# Patient Record
Sex: Female | Born: 1950
Health system: Southern US, Community
[De-identification: ages and names within clinical notes are randomized; demographics above are authoritative.]

## PROBLEM LIST (undated history)

## (undated) DIAGNOSIS — R112 Nausea with vomiting, unspecified: Secondary | ICD-10-CM

## (undated) DIAGNOSIS — Z9889 Other specified postprocedural states: Secondary | ICD-10-CM

## (undated) DIAGNOSIS — R87629 Unspecified abnormal cytological findings in specimens from vagina: Secondary | ICD-10-CM

## (undated) DIAGNOSIS — G4733 Obstructive sleep apnea (adult) (pediatric): Secondary | ICD-10-CM

## (undated) DIAGNOSIS — M797 Fibromyalgia: Secondary | ICD-10-CM

## (undated) DIAGNOSIS — H269 Unspecified cataract: Secondary | ICD-10-CM

## (undated) DIAGNOSIS — J309 Allergic rhinitis, unspecified: Secondary | ICD-10-CM

## (undated) DIAGNOSIS — M47816 Spondylosis without myelopathy or radiculopathy, lumbar region: Secondary | ICD-10-CM

## (undated) DIAGNOSIS — T7840XA Allergy, unspecified, initial encounter: Secondary | ICD-10-CM

## (undated) DIAGNOSIS — E785 Hyperlipidemia, unspecified: Secondary | ICD-10-CM

## (undated) DIAGNOSIS — F419 Anxiety disorder, unspecified: Secondary | ICD-10-CM

## (undated) DIAGNOSIS — K219 Gastro-esophageal reflux disease without esophagitis: Secondary | ICD-10-CM

## (undated) DIAGNOSIS — M75121 Complete rotator cuff tear or rupture of right shoulder, not specified as traumatic: Secondary | ICD-10-CM

## (undated) DIAGNOSIS — N950 Postmenopausal bleeding: Secondary | ICD-10-CM

## (undated) DIAGNOSIS — I10 Essential (primary) hypertension: Secondary | ICD-10-CM

## (undated) HISTORY — PX: REDUCTION MAMMAPLASTY: SUR839

## (undated) HISTORY — PX: TOTAL KNEE ARTHROPLASTY: SHX125

## (undated) HISTORY — PX: TONSILLECTOMY: SUR1361

## (undated) HISTORY — DX: Essential (primary) hypertension: I10

## (undated) HISTORY — PX: ADENOIDECTOMY: SUR15

## (undated) HISTORY — DX: Fibromyalgia: M79.7

## (undated) HISTORY — DX: Allergy, unspecified, initial encounter: T78.40XA

## (undated) HISTORY — DX: Spondylosis without myelopathy or radiculopathy, lumbar region: M47.816

## (undated) HISTORY — PX: EYE SURGERY: SHX253

## (undated) HISTORY — DX: Unspecified abnormal cytological findings in specimens from vagina: R87.629

## (undated) HISTORY — DX: Postmenopausal bleeding: N95.0

## (undated) HISTORY — PX: JOINT REPLACEMENT: SHX530

## (undated) HISTORY — DX: Allergic rhinitis, unspecified: J30.9

## (undated) HISTORY — DX: Hyperlipidemia, unspecified: E78.5

## (undated) HISTORY — DX: Unspecified cataract: H26.9

---

## 1978-12-31 HISTORY — PX: BREAST BIOPSY: SHX20

## 1998-12-31 DIAGNOSIS — G4700 Insomnia, unspecified: Secondary | ICD-10-CM

## 1998-12-31 HISTORY — DX: Insomnia, unspecified: G47.00

## 1998-12-31 HISTORY — PX: TOTAL KNEE ARTHROPLASTY: SHX125

## 1999-08-17 ENCOUNTER — Encounter: Payer: Self-pay | Admitting: Specialist

## 1999-08-21 ENCOUNTER — Inpatient Hospital Stay (HOSPITAL_COMMUNITY): Admission: RE | Admit: 1999-08-21 | Discharge: 1999-08-25 | Payer: Self-pay | Admitting: Specialist

## 1999-08-21 ENCOUNTER — Encounter: Payer: Self-pay | Admitting: Specialist

## 1999-12-18 ENCOUNTER — Encounter: Payer: Self-pay | Admitting: Specialist

## 1999-12-18 ENCOUNTER — Ambulatory Visit (HOSPITAL_COMMUNITY): Admission: RE | Admit: 1999-12-18 | Discharge: 1999-12-18 | Payer: Self-pay | Admitting: Specialist

## 2000-01-03 ENCOUNTER — Encounter: Payer: Self-pay | Admitting: Specialist

## 2000-01-03 ENCOUNTER — Ambulatory Visit (HOSPITAL_COMMUNITY): Admission: RE | Admit: 2000-01-03 | Discharge: 2000-01-04 | Payer: Self-pay | Admitting: Specialist

## 2000-01-17 ENCOUNTER — Ambulatory Visit (HOSPITAL_COMMUNITY): Admission: RE | Admit: 2000-01-17 | Discharge: 2000-01-17 | Payer: Self-pay | Admitting: Specialist

## 2000-01-17 ENCOUNTER — Encounter: Payer: Self-pay | Admitting: Specialist

## 2000-07-17 ENCOUNTER — Encounter: Admission: RE | Admit: 2000-07-17 | Discharge: 2000-07-17 | Payer: Self-pay | Admitting: Specialist

## 2000-07-17 ENCOUNTER — Encounter: Payer: Self-pay | Admitting: Specialist

## 2004-07-13 ENCOUNTER — Encounter: Admission: RE | Admit: 2004-07-13 | Discharge: 2004-07-13 | Payer: Self-pay | Admitting: Internal Medicine

## 2005-01-26 ENCOUNTER — Encounter: Admission: RE | Admit: 2005-01-26 | Discharge: 2005-01-26 | Payer: Self-pay | Admitting: Internal Medicine

## 2008-01-26 ENCOUNTER — Encounter: Admission: RE | Admit: 2008-01-26 | Discharge: 2008-01-26 | Payer: Self-pay | Admitting: Internal Medicine

## 2008-11-28 ENCOUNTER — Encounter: Payer: Self-pay | Admitting: Family Medicine

## 2008-12-26 ENCOUNTER — Encounter: Admission: RE | Admit: 2008-12-26 | Discharge: 2008-12-26 | Payer: Self-pay | Admitting: Internal Medicine

## 2009-04-05 ENCOUNTER — Encounter: Payer: Self-pay | Admitting: Family Medicine

## 2009-04-21 ENCOUNTER — Encounter: Admission: RE | Admit: 2009-04-21 | Discharge: 2009-04-21 | Payer: Self-pay | Admitting: Internal Medicine

## 2009-07-05 ENCOUNTER — Encounter: Payer: Self-pay | Admitting: Family Medicine

## 2010-01-23 ENCOUNTER — Encounter: Payer: Self-pay | Admitting: Family Medicine

## 2010-02-10 ENCOUNTER — Ambulatory Visit: Payer: Self-pay | Admitting: Family Medicine

## 2010-02-10 DIAGNOSIS — N393 Stress incontinence (female) (male): Secondary | ICD-10-CM | POA: Insufficient documentation

## 2010-02-10 DIAGNOSIS — N950 Postmenopausal bleeding: Secondary | ICD-10-CM | POA: Insufficient documentation

## 2010-02-10 DIAGNOSIS — I10 Essential (primary) hypertension: Secondary | ICD-10-CM | POA: Insufficient documentation

## 2010-02-10 DIAGNOSIS — E559 Vitamin D deficiency, unspecified: Secondary | ICD-10-CM | POA: Insufficient documentation

## 2010-02-10 DIAGNOSIS — Z96652 Presence of left artificial knee joint: Secondary | ICD-10-CM | POA: Insufficient documentation

## 2010-02-10 DIAGNOSIS — G4700 Insomnia, unspecified: Secondary | ICD-10-CM | POA: Insufficient documentation

## 2010-02-10 DIAGNOSIS — K219 Gastro-esophageal reflux disease without esophagitis: Secondary | ICD-10-CM | POA: Insufficient documentation

## 2010-02-13 ENCOUNTER — Encounter: Payer: Self-pay | Admitting: Family Medicine

## 2010-02-13 DIAGNOSIS — D72829 Elevated white blood cell count, unspecified: Secondary | ICD-10-CM | POA: Insufficient documentation

## 2010-02-15 LAB — CONVERTED CEMR LAB
Hemoglobin: 13.6 g/dL (ref 12.0–15.0)
RBC: 4.37 M/uL (ref 3.87–5.11)
RDW: 12.9 % (ref 11.5–15.5)
Saturation Ratios: 15 % — ABNORMAL LOW (ref 20–55)
TIBC: 365 ug/dL (ref 250–470)
TSH: 1.134 microintl units/mL (ref 0.350–4.500)
UIBC: 309 ug/dL
Vit D, 25-Hydroxy: 30 ng/mL (ref 30–89)

## 2010-02-17 ENCOUNTER — Encounter: Payer: Self-pay | Admitting: Family Medicine

## 2010-02-20 LAB — CONVERTED CEMR LAB
Basophils Absolute: 0.1 10*3/uL (ref 0.0–0.1)
Eosinophils Relative: 1 % (ref 0–5)
HCT: 40.6 % (ref 36.0–46.0)
Lymphocytes Relative: 44 % (ref 12–46)
Lymphs Abs: 4.9 10*3/uL — ABNORMAL HIGH (ref 0.7–4.0)
Neutro Abs: 5.2 10*3/uL (ref 1.7–7.7)
Platelets: 374 10*3/uL (ref 150–400)
RDW: 13.2 % (ref 11.5–15.5)
WBC: 11.2 10*3/uL — ABNORMAL HIGH (ref 4.0–10.5)

## 2010-03-30 ENCOUNTER — Ambulatory Visit: Payer: Self-pay | Admitting: Family Medicine

## 2010-03-30 DIAGNOSIS — A09 Infectious gastroenteritis and colitis, unspecified: Secondary | ICD-10-CM | POA: Insufficient documentation

## 2010-06-02 ENCOUNTER — Ambulatory Visit: Payer: Self-pay | Admitting: Family Medicine

## 2010-06-02 DIAGNOSIS — J019 Acute sinusitis, unspecified: Secondary | ICD-10-CM | POA: Insufficient documentation

## 2010-06-20 ENCOUNTER — Ambulatory Visit: Payer: Self-pay | Admitting: Family

## 2010-06-20 ENCOUNTER — Encounter: Admission: RE | Admit: 2010-06-20 | Discharge: 2010-06-20 | Payer: Self-pay | Admitting: Family Medicine

## 2010-06-20 DIAGNOSIS — F411 Generalized anxiety disorder: Secondary | ICD-10-CM | POA: Insufficient documentation

## 2010-06-20 DIAGNOSIS — M549 Dorsalgia, unspecified: Secondary | ICD-10-CM | POA: Insufficient documentation

## 2010-06-20 LAB — CONVERTED CEMR LAB
Glucose, Urine, Semiquant: NEGATIVE
Nitrite: NEGATIVE
Specific Gravity, Urine: 1.025
Urobilinogen, UA: 1
WBC Urine, dipstick: NEGATIVE

## 2010-06-21 ENCOUNTER — Encounter: Payer: Self-pay | Admitting: Family Medicine

## 2010-06-21 ENCOUNTER — Telehealth: Payer: Self-pay | Admitting: Family

## 2010-06-21 ENCOUNTER — Encounter: Payer: Self-pay | Admitting: Family

## 2010-06-21 LAB — CONVERTED CEMR LAB
Calcium: 9.8 mg/dL (ref 8.4–10.5)
Glucose, Bld: 77 mg/dL (ref 70–99)
Potassium: 4.2 meq/L (ref 3.5–5.3)
Sodium: 140 meq/L (ref 135–145)
Vit D, 25-Hydroxy: 38 ng/mL (ref 30–89)

## 2010-07-11 ENCOUNTER — Ambulatory Visit: Payer: Self-pay | Admitting: Family

## 2010-09-11 ENCOUNTER — Telehealth (INDEPENDENT_AMBULATORY_CARE_PROVIDER_SITE_OTHER): Payer: Self-pay | Admitting: *Deleted

## 2010-09-29 ENCOUNTER — Telehealth (INDEPENDENT_AMBULATORY_CARE_PROVIDER_SITE_OTHER): Payer: Self-pay | Admitting: *Deleted

## 2010-10-11 ENCOUNTER — Ambulatory Visit: Payer: Self-pay | Admitting: Family Medicine

## 2010-10-11 DIAGNOSIS — IMO0001 Reserved for inherently not codable concepts without codable children: Secondary | ICD-10-CM | POA: Insufficient documentation

## 2010-11-08 ENCOUNTER — Ambulatory Visit: Payer: Self-pay | Admitting: Family Medicine

## 2010-11-08 DIAGNOSIS — R1084 Generalized abdominal pain: Secondary | ICD-10-CM | POA: Insufficient documentation

## 2010-11-09 ENCOUNTER — Encounter: Payer: Self-pay | Admitting: Family Medicine

## 2010-11-10 LAB — CONVERTED CEMR LAB
ALT: 15 units/L (ref 0–35)
Basophils Absolute: 0 10*3/uL (ref 0.0–0.1)
CO2: 25 meq/L (ref 19–32)
Calcium: 9.6 mg/dL (ref 8.4–10.5)
Chloride: 104 meq/L (ref 96–112)
Creatinine, Ser: 0.87 mg/dL (ref 0.40–1.20)
Eosinophils Relative: 1 % (ref 0–5)
Glucose, Bld: 87 mg/dL (ref 70–99)
HCT: 41.1 % (ref 36.0–46.0)
Hemoglobin: 13.7 g/dL (ref 12.0–15.0)
Lipase: 15 units/L (ref 0–75)
Lymphocytes Relative: 39 % (ref 12–46)
Lymphs Abs: 3.8 10*3/uL (ref 0.7–4.0)
Monocytes Absolute: 0.8 10*3/uL (ref 0.1–1.0)
Neutro Abs: 5 10*3/uL (ref 1.7–7.7)
RDW: 13.4 % (ref 11.5–15.5)
Sodium: 140 meq/L (ref 135–145)
Total Protein: 6.9 g/dL (ref 6.0–8.3)
WBC: 9.7 10*3/uL (ref 4.0–10.5)

## 2010-12-13 ENCOUNTER — Encounter: Payer: Self-pay | Admitting: Family Medicine

## 2010-12-20 ENCOUNTER — Ambulatory Visit: Payer: Self-pay | Admitting: Family Medicine

## 2010-12-20 ENCOUNTER — Encounter: Payer: Self-pay | Admitting: Family Medicine

## 2010-12-20 LAB — CONVERTED CEMR LAB: Glucose, Bld: 92 mg/dL (ref 70–99)

## 2010-12-21 LAB — CONVERTED CEMR LAB
LDL Cholesterol: 122 mg/dL — ABNORMAL HIGH (ref 0–99)
Vit D, 25-Hydroxy: 45 ng/mL (ref 30–89)

## 2011-01-25 ENCOUNTER — Encounter: Payer: Self-pay | Admitting: Family Medicine

## 2011-01-25 ENCOUNTER — Telehealth: Payer: Self-pay | Admitting: Family Medicine

## 2011-01-30 NOTE — Assessment & Plan Note (Signed)
Summary: ? UTI   Vital Signs:  Patient profile:   60 year old female Height:      64.25 inches Weight:      218 pounds BMI:     37.26 O2 Sat:      97 % on Room air Temp:     98.5 degrees F oral Pulse rate:   86 / minute BP sitting:   117 / 70  (left arm) Cuff size:   large  Vitals Entered By: Payton Spark CMA (November 08, 2010 2:30 PM)  O2 Flow:  Room air CC: ? UTI and back pain   Primary Care Provider:  Seymour Bars DO  CC:  ? UTI and back pain.  History of Present Illness: 60 yo F prsents for pelvic pain and L flank pain that started yesterday.  She had chills and had some spotting in her panties.  She had this a few mos ago and saw her gyn and was told it was breakthrough from her HRT.  She had a UTI this summer. She has strong smelling urine but no dysuria.  No hematuria.  Having some urgency but no frequency. Had a loose BM yesterday but no N/V/ constipation or rectal bleeding.   Pain over the L lower back and is moderate.      Current Medications (verified): 1)  Zolpidem Tartrate 10 Mg Tabs (Zolpidem Tartrate) .... Take By Mouth Once Daily 2)  Omeprazole 40 Mg Cpdr (Omeprazole) .Marland Kitchen.. 1 Tab By Mouth Daily 3)  Lisinopril-Hydrochlorothiazide 20-25 Mg Tabs (Lisinopril-Hydrochlorothiazide) .... Take By Mouth Once Daily 4)  Combipatch 0.05-0.14 Mg/day Pttw (Estradiol-Norethindrone Acet) 5)  Align  Caps (Probiotic Product) 6)  Piroxicam 20 Mg Caps (Piroxicam) .Marland Kitchen.. 1 Capsule By Mouth Qam With Food For Muscle/ Joint  Pain 7)  Celexa 20 Mg Tabs (Citalopram Hydrobromide) .... 1/2 Tab By Mouth Daily X 7 Days Then Stop 8)  Nasonex 50 Mcg/act Susp (Mometasone Furoate) .... Use 2 Sprays Per Nostril Daily. 9)  Cymbalta 60 Mg Cpep (Duloxetine Hcl) .Marland Kitchen.. 1 Capsule By Mouth Daily 10)  Lyrica 50 Mg Caps (Pregabalin) .Marland Kitchen.. 1 Capsule By Mouth Two Times A Day  Allergies (verified): No Known Drug Allergies  Past History:  Past Medical History: Reviewed history from 10/11/2010 and no  changes required. HTN postmenopausal bleeding Fibromyalgia, [name] gyn Dr Christella Hartigan  Social History: Reviewed history from 02/10/2010 and no changes required. 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  Has a grown child - daughter in [place] Never smoked. occas ETOH. Walks for exercise.  Review of Systems      See HPI  Physical Exam  General:  alert, well-developed, well-nourished, and well-hydrated.  obese Eyes:  sclera non icteric Mouth:  good dentition and pharynx pink and moist.   Lungs:  Normal respiratory effort, chest expands symmetrically. Lungs are clear to auscultation, no crackles or wheezes. Heart:  Normal rate and regular rhythm. S1 and S2 normal without gallop, murmur, click, rub or other extra sounds. Abdomen:  slight periumbilical and suprapubic TTP w/o guarding.  No CVAT.  soft.  ND.  NABS Skin:  color normal.  no pallor, rash or jaundice   Impression & Recommendations:  Problem # 1:  ABDOMINAL PAIN, GENERALIZED (ICD-789.07) UA normal.  Postmenopausal bleeding is abnormal.  Will get labs to r/o other sources for abd pain but will need f/u with her gyn for endometrial biopsy.   Orders: T-CBC w/Diff ([id number]) T-Comprehensive Metabolic Panel [id number]) T-Lipase [id number])  Complete Medication  List: 1)  Zolpidem Tartrate 10 Mg Tabs (Zolpidem tartrate) .... Take by mouth once daily 2)  Omeprazole 40 Mg Cpdr (Omeprazole) .Marland Kitchen.. 1 tab by mouth daily 3)  Lisinopril-hydrochlorothiazide 20-25 Mg Tabs (Lisinopril-hydrochlorothiazide) .... Take by mouth once daily 4)  Combipatch 0.05-0.14 Mg/day Pttw (Estradiol-norethindrone acet) 5)  Align Caps (Probiotic product) 6)  Piroxicam 20 Mg Caps (Piroxicam) .Marland Kitchen.. 1 capsule by mouth qam with food for muscle/ joint  pain 7)  Celexa 20 Mg Tabs (Citalopram hydrobromide) .... 1/2 tab by mouth daily x 7 days then stop 8)  Nasonex 50 Mcg/act Susp (Mometasone furoate) .... Use 2 sprays per nostril  daily. 9)  Cymbalta 60 Mg Cpep (Duloxetine hcl) .Marland Kitchen.. 1 capsule by mouth daily 10)  Lyrica 50 Mg Caps (Pregabalin) .Marland Kitchen.. 1 capsule by mouth two times a day  Other Orders: UA Dipstick w/o Micro (automated)  (81003)  Patient Instructions: 1)  UA is normal. 2)  Will do labs today. 3)  Will call you w/ results tomorrow. 4)  Call Dr Christella Hartigan to follow up with him for spotting.   Orders Added: 1)  UA Dipstick w/o Micro (automated)  [81003] 2)  T-CBC w/Diff [[id number]] 3)  T-Comprehensive Metabolic Panel [[id number]] 4)  T-Lipase [[id number]] 5)  Est. Patient Level III [09811]  Appended Document: ? UTI  Laboratory Results   Urine Tests    Routine Urinalysis   Color: yellow Appearance: Clear Glucose: negative   (Normal Range: Negative) Bilirubin: small   (Normal Range: Negative) Ketone: trace (5)   (Normal Range: Negative) Spec. Gravity: >=1.030   (Normal Range: 1.003-1.035) Blood: small   (Normal Range: Negative) pH: 6.0   (Normal Range: 5.0-8.0) Protein: 30   (Normal Range: Negative) Urobilinogen: 1.0   (Normal Range: 0-1) Nitrite: negative   (Normal Range: Negative) Leukocyte Esterace: negative   (Normal Range: Negative)

## 2011-01-30 NOTE — Progress Notes (Signed)
Summary: Ambien  Phone Note Call from Patient Call back at Work Phone [phone]   Caller: Patient Call For: Seymour Bars DO Summary of Call: Pt calls and states pharmacy did not get the Ambien rx and she is out. [facility] Initial call taken by: Kathlene November LPN,  September 29, 2010 10:53 AM  Follow-up for Phone Call        Rx faxed this AM. Follow-up by: Payton Spark CMA,  September 29, 2010 11:19 AM

## 2011-01-30 NOTE — Assessment & Plan Note (Signed)
Summary: NOV HTN   Vital Signs:  Patient profile:   60 year old female Height:      64.25 inches Weight:      233 pounds BMI:     39.83 O2 Sat:      97 % on Room air Temp:     98.1 degrees F oral Pulse rate:   100 / minute BP sitting:   125 / 84  (right arm) Cuff size:   large  Vitals Entered By: Payton Spark CMA/April (February 10, 2010 3:51 PM)  O2 Flow:  Room air CC: new patient to discuss meds   Primary Care Provider:  Seymour Bars DO  CC:  new patient to discuss meds.  History of Present Illness: 60 yo F presents for NOV.  She has hx of HTN.  She is taking Lisinopril/HCTZ daily.  Her labs were updated in August.  She is seeing Dr Ricka Burdock at [facility] for stress urinary incontinence and abnormal uterine bleeding.  She went for an endometrial biopsy b/c she has been postmenopausal x 11 yrs.  She also had a vag u/s that was + for fibroids.  Her pathology was neg for cancer.  She has f/u there.  She is due for med RFs.  She works as a Pharmacist, hospital.  She also has hx of low Vit D and GERD.    Her last colonoscopy was June 09. Mammogram is due in March.  Pap smear Updated Oct 2010.  Current Medications (verified): 1)  Zolpidem Tartrate 10 Mg Tabs (Zolpidem Tartrate) .... Take By Mouth Once Daily 2)  Omeprazole 20 Mg Cpdr (Omeprazole) .... Take 2 Once Daily 3)  Lisinopril-Hydrochlorothiazide 20-25 Mg Tabs (Lisinopril-Hydrochlorothiazide) .... 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 Married to [place].  Has a grown child - daughter in  [place] Never smoked. occas ETOH. Walks for exercise.  Review of Systems       no fevers/sweats/weakness, unexplained wt loss/gain, no change in vision, no difficulty hearing, ringing in ears, no hay fever/allergies, no CP/discomfort, no palpitations, no breast lump/nipple discharge, no cough/wheeze, no blood in stool,no  N/V/D, + nocturia, + leaking urine, + unusual vag bleeding, no vaginal/penile discharge, no muscle/joint pain, no rash, no new/changing mole, no HA, no memory loss, no anxiety, + sleep problem, no depression, no unexplained lumps, no easy bruising/bleeding, no concern with sexual function     Physical Exam  General:  alert, well-developed, well-nourished, and well-hydrated.  obese Head:  normocephalic and atraumatic.   Eyes:  bilat anterior chamber clouding wears glasses Ears:  no external deformities.   Nose:  no nasal discharge.   Mouth:  good dentition and pharynx pink and moist.   Neck:  no masses.   Lungs:  Normal respiratory effort, chest expands symmetrically. Lungs are clear to auscultation, no crackles or wheezes. Heart:  Normal rate and regular rhythm. S1 and S2 normal without gallop, murmur, click, rub or other extra sounds. Pulses:  2+ radial pulses Extremities:  no E/C/C Skin:  color normal.   Cervical Nodes:  No lymphadenopathy noted Psych:  good eye contact, not anxious appearing, and  not depressed appearing.     Impression & Recommendations:  Problem # 1:  ESSENTIAL HYPERTENSION, BENIGN (ICD-401.1) BP at goal.  RFds Med meds.  Had labs Aug 2010.  Will get records.  BMI 39 c/w class II obesity.  Discussed working on diet, exercise and wt loss. Her updated medication list for this problem includes:    Lisinopril-hydrochlorothiazide 20-25 Mg Tabs (Lisinopril-hydrochlorothiazide) .Marland Kitchen... Take by mouth once daily  BP today: 125/84  Problem # 2:  POSTMENOPAUSAL BLEEDING (ICD-627.1) Seeing [facility] for this.  Just had endometrial biopsy and u/s done  which was neg for cancer.  she is on HRT and she has f/u with him.  Will check labs to make sure she does not have secondary iron def anemia. Her updated medication list for this problem includes:    Combipatch 0.05-0.14 Mg/day Pttw (Estradiol-norethindrone acet)  Orders: T-CBC No Diff ([id number]) T-Iron ([id number]) T-Iron Binding Capacity (TIBC) ([id number]) T-TSH ([id number])  Problem # 3:  STRESS INCONTINENCE (ICD-788.39) Seeing gyn for a specific treamtment program  Problem # 4:  ESOPHAGEAL REFLUX (ICD-530.81) Changed Rx from Prilosec 20 2 tabs daily to Omeprazole 40 mg daily for cost savings. Her updated medication list for this problem includes:    Omeprazole 40 Mg Cpdr (Omeprazole) .Marland Kitchen... 1 tab by mouth daily  Complete Medication List: 1)  Zolpidem Tartrate 10 Mg Tabs (Zolpidem tartrate) .... Take by mouth once daily 2)  Omeprazole 40 Mg Cpdr (Omeprazole) .Marland Kitchen.. 1 tab by mouth daily 3)  Lisinopril-hydrochlorothiazide 20-25 Mg Tabs (Lisinopril-hydrochlorothiazide) .... Take by mouth once daily 4)  Combipatch 0.05-0.14 Mg/day Pttw (Estradiol-norethindrone acet)  Other Orders: T-Vitamin D (25-Hydroxy) ([id number])  Patient Instructions: 1)  Have labs drawn today. 2)  Will call you w/ results on Monday. 3)  Will get old records. 4)  Return for f/u BP in 4 mos. Prescriptions: OMEPRAZOLE 40 MG CPDR (OMEPRAZOLE) 1 tab by mouth daily  #30 x 3   Entered and Authorized by:   Seymour Bars DO   Signed by:   Seymour Bars DO on 02/10/2010   Method used:   Printed then faxed to ...       [facility] [address] [facility] [address] [facility]       [address]       [place], [place]  [address]       Ph: [phone]       Fax: [phone]   RxID:   [id number] ZOLPIDEM TARTRATE 10 MG TABS (ZOLPIDEM TARTRATE) take by mouth once daily  #30 x 2   Entered and Authorized by:   Seymour Bars DO   Signed by:   Seymour Bars DO on 02/10/2010   Method used:   Printed then faxed to ...       [facility] [address] (retail)       [address]       [place], [place]  [address]       Ph: [phone]       Fax: [phone]   RxID:   [id number]

## 2011-01-30 NOTE — Assessment & Plan Note (Signed)
Summary: Back pain - jr   Vital Signs:  Patient profile:   60 year old female Height:      64.25 inches Pulse rate:   94 / minute BP sitting:   123 / 77  (left arm) Cuff size:   large  Vitals Entered By: Kathlene November (June 20, 2010 1:02 PM) CC: back pain- sees ortho and told it was arthritis   Primary Care Provider:  Seymour Bars DO  CC:  back pain- sees ortho and told it was arthritis.  History of Present Illness: Ms Julson is a 60 year old female who presents today with complaint of low back pain.  She has seen Dr. Jillyn Hidden with [facility] in the past and reports that she underwent an MRI of the spine about 3 months ago and was told that it showed mild arthitic changes in her back.   She reports left low back pain which is unchanged.  She reports increased soreness in her neck and legs-feels nervous and jittery.  Her Mother is currently in a nursing home and recently underwent surgery.  She feels that this has been stressful for her.  She reports occasional nervousness which is associated with nausea and palitations.  This is improved with meditation/relaxation.  She has been waking up at night.  Denies associated symptoms of depression.  Patient has lost 40 pounds.  Patient notes + history of kidney stone in the past.    Current Medications (verified): 1)  Zolpidem Tartrate 10 Mg Tabs (Zolpidem Tartrate) .... Take By Mouth Once Daily 2)  Omeprazole 40 Mg Cpdr (Omeprazole) .Marland Kitchen.. 1 Tab By Mouth Daily 3)  Lisinopril-Hydrochlorothiazide 20-25 Mg Tabs (Lisinopril-Hydrochlorothiazide) .... Take By Mouth Once Daily 4)  Combipatch 0.05-0.14 Mg/day Pttw (Estradiol-Norethindrone Acet) 5)  Align  Caps (Probiotic Product) 6)  Meloxicam 7.5 Mg Tabs (Meloxicam) .... Take One Tablet By Mouth After A Meal  Allergies (verified): No Known Drug Allergies  Comments:  Nurse/Medical Assistant: The patient's medications and allergies were reviewed with the patient and were updated in the Medication and  Allergy Lists. 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 occas ETOH. Walks for exercise.  Review of Systems       +frequency/urgency of urination.    Physical Exam  General:  Well-developed,well-nourished,in no acute distress; alert,appropriate and cooperative throughout examination Head:  Normocephalic and atraumatic without obvious abnormalities. No apparent alopecia or balding. Lungs:  Normal respiratory effort, chest expands symmetrically. Lungs are clear to auscultation, no crackles or wheezes. Heart:  Normal rate and regular rhythm. S1 and S2 normal without gallop, murmur, click, rub or other extra sounds. Msk:  negative CVAT, no tenderness noted over spine.   Impression & Recommendations:  Problem # 1:  ANXIETY (ICD-300.00) Assessment New Will give trial of citalopram. Her updated medication list for this problem includes:    Celexa 20 Mg Tabs (Citalopram hydrobromide) .Marland Kitchen... 1/2 tab by mouth daily x 1 week, then increase to one tablet daily  Problem # 2:  BACK PAIN (ICD-724.5)  trace blood in urinalysis.  CT negative for stones.  Patient reports MRI with Orthtopedics significant only for arthritic changes.  Plan to continue meloxicam and will refer to physical therapy. Send UA for culture Her updated medication list for this problem includes:    Meloxicam 7.5 Mg Tabs (Meloxicam) .Marland Kitchen... Take one tablet by mouth after a meal  Orders: UA Dipstick w/o Micro (automated)  (81003) T-Basic  Metabolic Panel ([id number]) CT without Contrast (CT w/o contrast) Physical Therapy Referral (PT)  Problem # 3:  VITAMIN D DEFICIENCY (ICD-268.9) check vitamin D level Orders: T-Assay of Vitamin D ([id number])  Complete Medication List: 1)  Zolpidem Tartrate 10 Mg Tabs (Zolpidem tartrate) .... Take by mouth once daily 2)  Omeprazole 40 Mg Cpdr (Omeprazole) .Marland Kitchen.. 1 tab by mouth daily 3)  Lisinopril-hydrochlorothiazide 20-25 Mg Tabs (Lisinopril-hydrochlorothiazide) .... Take by mouth once daily 4)  Combipatch 0.05-0.14 Mg/day Pttw (Estradiol-norethindrone acet) 5)  Align Caps (Probiotic product) 6)  Meloxicam 7.5 Mg Tabs (Meloxicam) .... Take one tablet by mouth after a meal 7)  Ergocalciferol 50000 Unit Caps (Ergocalciferol) .... One tablet by mouth once weekly 8)  Celexa 20 Mg Tabs (Citalopram hydrobromide) .... 1/2 tab by mouth daily x 1 week, then increase to one tablet daily  Other Orders: T-Culture, Urine ([id number])  Patient Instructions: 1)  Please complete your CT downstairs. 2)  Citalopram- please start 1/2 tablet one a day for one week, then increase to a full tablet. 3)  It will likely take several weeks before you will notice improvement. 4)  Side effects of this medicine may include drowsiness or nausea.  If this becomes an issue for you call for further instructions. 5)  Very rarely people may develop suicidal thoughts when taking these types of medicines- should this happen to you, discontinue medication and go directly to the emergency room. 6)  Please arrange a follow up appointment in 1 month  Prescriptions: CELEXA 20 MG TABS (CITALOPRAM HYDROBROMIDE) 1/2 tab by mouth daily x 1 week, then increase to one tablet daily  #30 x 1   Entered and Authorized by:   Lemont Fillers FNP   Signed by:   Lemont Fillers FNP on 06/20/2010   Method used:   [facility]        [facility]       [address]       [place], [place]   [address]       Ph: [phone]       Fax: [phone]   RxID:   [id number]   Laboratory Results   Urine Tests  Date/Time Received: 06/20/2010 Date/Time Reported: 06/20/2010  Routine Urinalysis   Color: yellow Appearance: Clear Glucose: negative   (Normal Range: Negative) Bilirubin: negative   (Normal Range: Negative) Ketone: trace (5)   (Normal Range: Negative) Spec. Gravity: 1.025   (Normal Range: 1.003-1.035) Blood: small   (Normal Range: Negative) pH: 6.0   (Normal Range: 5.0-8.0) Protein: trace   (Normal Range: Negative) Urobilinogen: 1.0   (Normal Range: 0-1) Nitrite: negative   (Normal Range: Negative) Leukocyte Esterace: negative   (Normal Range: Negative)

## 2011-01-30 NOTE — Assessment & Plan Note (Signed)
Summary: follow up, not feeling well   Vital Signs:  Patient profile:   60 year old female Height:      64.25 inches Weight:      218 pounds Temp:     98.5 degrees F oral Pulse rate:   68 / minute BP sitting:   111 / 69  (left arm) Cuff size:   large  Vitals Entered By: Kathlene November (July 11, 2010 3:56 PM) CC: followup on Celexa- pt states feeling some better- not as nervous and uneasy as she was   Primary Care Provider:  Seymour Bars DO  CC:  followup on Celexa- pt states feeling some better- not as nervous and uneasy as she was.  History of Present Illness: Ms Doland is a 60 year old female who presents today for followup of her anxiety.  Reports feeling better since starting on Celexa.   She continues to have stress related to her aging mother- but feels like she is better able to put this in perspective.  Sleeping better, not having as much nervous jerking of her legs.  Denies panic attacks.  Tolerating med without significant side effects. Denies symptoms of depression or suicide ideation.  Current Medications (verified): 1)  Zolpidem Tartrate 10 Mg Tabs (Zolpidem Tartrate) .... Take By Mouth Once Daily 2)  Omeprazole 40 Mg Cpdr (Omeprazole) .Marland Kitchen.. 1 Tab By Mouth Daily 3)  Lisinopril-Hydrochlorothiazide 20-25 Mg Tabs (Lisinopril-Hydrochlorothiazide) .... Take By Mouth Once Daily 4)  Combipatch 0.05-0.14 Mg/day Pttw (Estradiol-Norethindrone Acet) 5)  Align  Caps (Probiotic Product) 6)  Meloxicam 7.5 Mg Tabs (Meloxicam) .... Take One Tablet By Mouth After A Meal 7)  Celexa 20 Mg Tabs (Citalopram Hydrobromide) .... Take One Tablet By Mouth Once A Day  Allergies (verified): No Known Drug Allergies  Comments:  Nurse/Medical Assistant: The patient's medications and allergies were reviewed with the patient and were updated in the Medication and Allergy Lists. Kathlene November (July 11, 2010 3:59 PM)  Physical Exam  General:  Well-developed,well-nourished,in no acute distress;  alert,appropriate and cooperative throughout examination Psych:  Cognition and judgment appear intact. Alert and cooperative with normal attention span and concentration. No apparent delusions, illusions, hallucinations   Impression & Recommendations:  Problem # 1:  ANXIETY (ICD-300.00) Assessment Improved Symptoms improved, plan to continue celexa.  15 minutes spent with patient. Greater than 50% of this time was spent counseling patient on her anxiety. Her updated medication list for this problem includes:    Celexa 20 Mg Tabs (Citalopram hydrobromide) .Marland Kitchen... Take one tablet by mouth once a day  Complete Medication List: 1)  Zolpidem Tartrate 10 Mg Tabs (Zolpidem tartrate) .... Take by mouth once daily 2)  Omeprazole 40 Mg Cpdr (Omeprazole) .Marland Kitchen.. 1 tab by mouth daily 3)  Lisinopril-hydrochlorothiazide 20-25 Mg Tabs (Lisinopril-hydrochlorothiazide) .... Take by mouth once daily 4)  Combipatch 0.05-0.14 Mg/day Pttw (Estradiol-norethindrone acet) 5)  Align Caps (Probiotic product) 6)  Meloxicam 7.5 Mg Tabs (Meloxicam) .... Take one tablet by mouth after a meal 7)  Celexa 20 Mg Tabs (Citalopram hydrobromide) .... Take one tablet by mouth once a day  Patient Instructions: 1)  Please follow up with Dr. Cathey Endow in 3 months, sooner if problems or concerns. 2)  Follow up with Physical Therapist.  Prescriptions: CELEXA 20 MG TABS (CITALOPRAM HYDROBROMIDE) Take one tablet by mouth once a day  #30 x 2   Entered and Authorized by:   Lemont Fillers FNP   Signed by:   Lemont Fillers FNP on 07/11/2010  Method used:   Electronically to        [facility] [address] [facility] [address]       [address]       [place], [place]  [address]       Ph: [phone]       Fax: [phone]   RxID:   [id number]

## 2011-01-30 NOTE — Medication Information (Signed)
Summary: Approval for Additional Quantity Zolpidem Tartrate/[facility]  Approval for Additional Quantity Zolpidem Tartrate/[facility]   Imported By: Lanelle Bal 04/06/2010 11:08:36  _____________________________________________________________________  External Attachment:    Type:   Image     Comment:   External Document

## 2011-01-30 NOTE — Assessment & Plan Note (Signed)
Summary: diarrhea   Vital Signs:  Patient profile:   60 year old female Height:      64.25 inches Weight:      222 pounds BMI:     37.95 O2 Sat:      97 % on Room air Temp:     98.3 degrees F oral Pulse rate:   87 / minute BP sitting:   123 / 82  (left arm) Cuff size:   large  Vitals Entered By: Payton Spark CMA (March 30, 2010 10:19 AM)  O2 Flow:  Room air CC: Diarrhea and achy x 3 days. Not sure if it is a virus or IBS.    Primary Care Provider:  Seymour Bars DO  CC:  Diarrhea and achy x 3 days. Not sure if it is a virus or IBS. Marland Kitchen  History of Present Illness: 60 yo F presents for loose stools x 3 days.  She has hx of IBS.  She ate a hot dog about 2 hrs prior to this occuring.  [facility] been seeing [facility] and has been taking a Probiotic.  She had a colonoscopy in Dec 08.  She has no known hx of diverticulosis.  She thinks that she had a CT abd/ pelvis with her gyn last year.   She has some lower abdominal cramping, mostly LLQ which has been on an off for months but she has not had this much diarrhea in the past.  She denies  seeing  blood in her stool   Pepto Bismol and bentyl have not helped.  Her stools are loose and watery and occuring about 20 x a day. .  She had been back on Weight Watchers and has lost about 11 lbs.  She denies fevers or chills.  She is trying to stay hydrated.  She has a little nausea but no vomitting.    She is starting to feel weak.  Denies sick contacts.  Denies recent travel.      Current Medications (verified): 1)  Zolpidem Tartrate 10 Mg Tabs (Zolpidem Tartrate) .... Take By Mouth Once Daily 2)  Omeprazole 40 Mg Cpdr (Omeprazole) .Marland Kitchen.. 1 Tab By Mouth Daily 3)  Lisinopril-Hydrochlorothiazide 20-25 Mg Tabs (Lisinopril-Hydrochlorothiazide) .... Take By Mouth Once Daily 4)  Combipatch 0.05-0.14 Mg/day Pttw (Estradiol-Norethindrone Acet) 5)  Align  Caps (Probiotic Product)  Allergies (verified): No Known Drug Allergies  Past  History:  Past Medical History: Reviewed history from 02/10/2010 and no changes required. HTN postmenopausal bleeding  gyn Dr Christella Hartigan  Past Surgical History: Reviewed history from 02/10/2010 and no changes required. R knee replacement 2000, Dr Shelle Iron breast biopsy 1980,neg  Family History: Reviewed history from 02/10/2010 and no changes required. Mother, alive, DM Father died, war wounds, HTN, DM 2 brothers healthy  Social History: Reviewed history from 02/10/2010 and no changes required. 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  Has a grown child - daughter in [place] Never smoked. occas ETOH. Walks for exercise.  Review of Systems      See HPI  Physical Exam  General:  alert, well-developed, well-nourished, and well-hydrated.  obese Head:  normocephalic and atraumatic.   Eyes:  sclera non icteric Mouth:  pharynx pink and moist.   Neck:  no masses.   Lungs:  Normal respiratory effort, chest expands symmetrically. Lungs are clear to auscultation, no crackles or wheezes. Heart:  Normal rate and regular rhythm. S1 and S2 normal without gallop, murmur, click, rub or other extra sounds.  Abdomen:  soft.  NT/ND.  NABS.  No HSM.   Extremities:  trace LE edema bilat Skin:  no pallor or jaundice. skin warm and dry Cervical Nodes:  No lymphadenopathy noted Psych:  good eye contact, not anxious appearing, and not depressed appearing.     Impression & Recommendations:  Problem # 1:  INFECTIOUS COLITIS ENTERITIS AND GASTROENTERITIS (ICD-009.0) 3 days of large quantity loose stool w/o clinical dehydration.  Unlikely IBS given large # of stools/ day. Will treat with Cipro 500 mg two times a day x 5 days and Immodium to help slow down diarrhea. Rest.  Hydrate with diluted gatorade.  If not improved by Monday, will get a stool sample, CBC and CMP. Will look at her CT abd/ pelvis done by gyn last year to look for cause of her LLQ pain.  Complete Medication  List: 1)  Zolpidem Tartrate 10 Mg Tabs (Zolpidem tartrate) .... Take by mouth once daily 2)  Omeprazole 40 Mg Cpdr (Omeprazole) .Marland Kitchen.. 1 tab by mouth daily 3)  Lisinopril-hydrochlorothiazide 20-25 Mg Tabs (Lisinopril-hydrochlorothiazide) .... Take by mouth once daily 4)  Combipatch 0.05-0.14 Mg/day Pttw (Estradiol-norethindrone acet) 5)  Align Caps (Probiotic product) 6)  Cipro 500 Mg Tabs (Ciprofloxacin hcl) .Marland Kitchen.. 1 tab by mouth two times a day x 5 days  Patient Instructions: 1)  Rest and hydrate with diluted gatorade throughout the day. 2)  Advance diet to bananas, rice, applesause and toast as abdominal pain and diarrhea improve. 3)  Take Cipro 500 mg 2 x a day for infectious coliits. 4)  Use Immodium 2 tabs by mouth 3 x a day as needed for diarrhea. 5)  Call if diarrhea has not improved by Monday.   Prescriptions: CIPRO 500 MG TABS (CIPROFLOXACIN HCL) 1 tab by mouth two times a day x 5 days  #10 x 0   Entered and Authorized by:   Seymour Bars DO   Signed by:   Seymour Bars DO on 03/30/2010   Method used:   Electronically to        [facility] [address] [facility]       [address]       [place], [place]  [address]       Ph: [phone]       Fax: [phone]   RxID:   [id number]   Appended Document: diarrhea Pls let pt know that I reviewed her CT abd/ pelvis done in Jan by Dr Christella Hartigan and it was normal other than incidental finding of cysts on her liver.  Seymour Bars, D.O.  Appended Document: diarrhea LMOM informing Pt of the above

## 2011-01-30 NOTE — Progress Notes (Signed)
       New/Updated Medications: NASONEX 50 MCG/ACT SUSP (MOMETASONE FUROATE) Use 2 sprays per nostril daily. Prescriptions: NASONEX 50 MCG/ACT SUSP (MOMETASONE FUROATE) Use 2 sprays per nostril daily.  #1 bottle x 1   Entered by:   Payton Spark CMA   Authorized by:   Seymour Bars DO   Signed by:   Payton Spark CMA on 09/11/2010   Method used:   [facility] [address] [facility]       [address]       [place], [place]  [address]       Ph: [phone]       Fax: [phone]   RxID:   [id number]

## 2011-01-30 NOTE — Letter (Signed)
Summary: [facility]  [facility]   Imported By: Lanelle Bal 04/06/2010 11:09:39  _____________________________________________________________________  External Attachment:    Type:   Image     Comment:   External Document

## 2011-01-30 NOTE — Letter (Signed)
   [place] [facility] [address] [place], [place]  [address]  [place] Phone: [phone]      June 21, 2010   [facility] [place] [address] [place], [place] [address]  RE:  LAB RESULTS  Dear  Ms. Mchale,  The following is an interpretation of your most recent lab tests.  Please take note of any instructions provided or changes to medications that have resulted from your lab work.  ELECTROLYTES:  Good - no changes needed  KIDNEY FUNCTION TESTS:  Good - no changes needed  Your Vitamin D level is normal.  Please stop the weekly vitamin D supplement and start a daily OTC supplement as below. Please follow up in 3 months for a vitamin D level.   Medications Prescribed or Changed VITAMIN D3 1000 UNIT TABS (CHOLECALCIFEROL) 3 tabs by mouth daily   Sincerely Yours,    Lemont Fillers FNP

## 2011-01-30 NOTE — Miscellaneous (Signed)
Summary: CBC with diff  Clinical Lists Changes  Problems: Added new problem of LEUKOCYTOSIS (ICD-288.60) Orders: Added new Test order of T-CBC w/Diff [id number]) - Signed

## 2011-01-30 NOTE — Letter (Signed)
Summary: [facility]  [facility]   Imported By: Lanelle Bal 04/06/2010 11:10:29  _____________________________________________________________________  External Attachment:    Type:   Image     Comment:   External Document

## 2011-01-30 NOTE — Assessment & Plan Note (Signed)
Summary: sinusitis   Vital Signs:  Patient profile:   60 year old female Height:      64.25 inches Weight:      222 pounds Pulse rate:   90 / minute BP sitting:   120 / 74  (left arm) Cuff size:   large  Vitals Entered By: Kathlene November (June 02, 2010 4:08 PM) CC: H/A, congestion of sinuses, drainage, cough for 3 weeks   Primary Care Provider:  Seymour Bars DO  CC:  H/A, congestion of sinuses, drainage, and cough for 3 weeks.  History of Present Illness: 60 yo F present for 3 wks of head congestion.  She has been taking Zyrtec.  Worse in the AM and in the evening.  She has copious rhinorrhea in the AM, mostly clear - yellow.  She has a little bit of a cough.  She has frontal and L maxillary pressure.  She is taking Tylenol.  No fevers or chills.  Has a scratchy throat.  No ear pain.  She has seasonal allergies.  Current Medications (verified): 1)  Zolpidem Tartrate 10 Mg Tabs (Zolpidem Tartrate) .... Take By Mouth Once Daily 2)  Omeprazole 40 Mg Cpdr (Omeprazole) .Marland Kitchen.. 1 Tab By Mouth Daily 3)  Lisinopril-Hydrochlorothiazide 20-25 Mg Tabs (Lisinopril-Hydrochlorothiazide) .... Take By Mouth Once Daily 4)  Combipatch 0.05-0.14 Mg/day Pttw (Estradiol-Norethindrone Acet) 5)  Align  Caps (Probiotic Product)  Allergies (verified): No Known Drug Allergies  Comments:  Nurse/Medical Assistant: The patient's medications and allergies were reviewed with the patient and were updated in the Medication and Allergy Lists. Kathlene November (June 02, 2010 4:09 PM)  Past History:  Past Medical History: Reviewed history from 02/10/2010 and no changes required. HTN postmenopausal bleeding  gyn Dr Christella Hartigan  Family History: Reviewed history from 02/10/2010 and no changes required. Mother, alive, DM Father died, war wounds, HTN, DM 2 brothers healthy  Social History: Reviewed history from 02/10/2010 and no changes required. 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 occas ETOH. Walks for exercise.  Review of Systems      See HPI  Physical Exam  General:  alert, well-developed, well-nourished, and well-hydrated.   Head:  L frontal and L maxillary sinuses TTP Eyes:  conjunctiva clear  Ears:  EACs patent; TMs translucent and gray with good cone of light and bony landmarks.  Nose:  no nasal discharge.   Mouth:  pharynx pink and moist.   Neck:  no masses.   Lungs:  Normal respiratory effort, chest expands symmetrically. Lungs are clear to auscultation, no crackles or wheezes. Heart:  Normal rate and regular rhythm. S1 and S2 normal without gallop, murmur, click, rub or other extra sounds. Skin:  color normal.   Cervical Nodes:  No lymphadenopathy noted   Impression & Recommendations:  Problem # 1:  ACUTE SINUSITIS, UNSPECIFIED (ICD-461.9) 3 wks of head congestion with preceeding allergies and L frontal and maxillary pressure. Treat with 7 days of Advil Cold and Sinus + Cefuroxime x 10 days + Omnaris sample.   Call if not improved in 10 days. The following medications were removed from the medication list:    Cipro 500 Mg Tabs (Ciprofloxacin hcl) .Marland Kitchen... 1 tab by mouth two times a day x 5 days Her updated medication list for this problem includes:    Cefuroxime Axetil 250 Mg Tabs (Cefuroxime axetil) .Marland Kitchen... 1 tab by mouth q 12 hrs x 10 days  Complete Medication List: 1)  Zolpidem Tartrate 10 Mg Tabs (Zolpidem tartrate) .... Take by mouth once daily 2)  Omeprazole 40 Mg Cpdr (Omeprazole) .Marland Kitchen.. 1 tab by mouth daily 3)  Lisinopril-hydrochlorothiazide 20-25 Mg Tabs (Lisinopril-hydrochlorothiazide) .... Take by mouth once daily 4)  Combipatch 0.05-0.14 Mg/day Pttw (Estradiol-norethindrone acet) 5)  Align Caps (Probiotic product) 6)  Cefuroxime Axetil 250 Mg Tabs (Cefuroxime axetil) .Marland Kitchen.. 1 tab by mouth q 12 hrs x 10 days  Patient Instructions: 1)  Take Cefuroxime every 12 hrs for sinusitis. 2)  Use  Omnaris 2 sprays/ nostril once a day. 3)  Take OTC Advil Cold and Sinus for the next 7 days. 4)  Call if not improved in 10 days. Prescriptions: CEFUROXIME AXETIL 250 MG TABS (CEFUROXIME AXETIL) 1 tab by mouth q 12 hrs x 10 days  #20 x 0   Entered and Authorized by:   Seymour Bars DO   Signed by:   Seymour Bars DO on 06/02/2010   Method used:   [facility] to        [facility] [address] [facility]       [address]       [place], [place]  [address]       Ph: [phone]       Fax: [phone]   RxID:   [id number]

## 2011-01-30 NOTE — Assessment & Plan Note (Signed)
Summary: [name]/u fibromyalgia   Vital Signs:  Patient profile:   60 year old female Height:      64.25 inches Weight:      218 pounds BMI:     37.26 O2 Sat:      97 % on Room air Pulse rate:   80 / minute BP sitting:   123 / 81  (left arm) Cuff size:   large  Vitals Entered By: Payton Spark CMA (October 11, 2010 3:50 PM)  O2 Flow:  Room air CC: [name]/U. C/o muscle and joint pain.    Primary Care Provider:  Seymour Bars DO  CC:  [name]/U. C/o muscle and joint pain. Marland Kitchen  History of Present Illness: Sara [name] presents for problems with muscle and joint pain for years.  She has started therapeutic massage that has started to help.  She is using Aleve for pain and inflammation.  Saw Rheum at [facility] and was on Cymbalta, Tramadol and Neurontin in the past.  She saw Dr Jimmy Footman 2 yrs ago.  She is on Celexa which has helped her mood some.    she is looking for an answer to her daily aches and pains.  She has yet to really start exercising and has failed to lose any wt.     Current Medications (verified): 1)  Zolpidem Tartrate 10 Mg Tabs (Zolpidem Tartrate) .... Take By Mouth Once Daily 2)  Omeprazole 40 Mg Cpdr (Omeprazole) .Marland Kitchen.. 1 Tab By Mouth Daily 3)  Lisinopril-Hydrochlorothiazide 20-25 Mg Tabs (Lisinopril-Hydrochlorothiazide) .... Take By Mouth Once Daily 4)  Combipatch 0.05-0.14 Mg/day Pttw (Estradiol-Norethindrone Acet) 5)  Align  Caps (Probiotic Product) 6)  Meloxicam 7.5 Mg Tabs (Meloxicam) .... Take One Tablet By Mouth After A Meal 7)  Celexa 20 Mg Tabs (Citalopram Hydrobromide) .... Take One Tablet By Mouth Once A Day 8)  Nasonex 50 Mcg/act Susp (Mometasone Furoate) .... Use 2 Sprays Per Nostril Daily.  Allergies (verified): No Known Drug Allergies  Past History:  Past Medical History: HTN postmenopausal bleeding Fibromyalgia, [name] gyn Dr Christella Hartigan  Social History: Reviewed history from 02/10/2010 and no changes required. 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 occas ETOH. Walks for exercise.  Review of Systems      See HPI  Physical Exam  General:  alert, well-developed, well-nourished, and well-hydrated.  obese Head:  normocephalic and atraumatic.   Mouth:  pharynx pink and moist.   Neck:  no masses.   Lungs:  Normal respiratory effort, chest expands symmetrically. Lungs are clear to auscultation, no crackles or wheezes. Heart:  Normal rate and regular rhythm. S1 and S2 normal without gallop, murmur, click, rub or other extra sounds. Msk:  no joint tenderness, no joint swelling, and no joint warmth.  no sign of active synovitis.  traps are diffusely tender Extremities:  no LE edema Neurologic:  gait normal.   Skin:  color normal.   Cervical Nodes:  No lymphadenopathy noted Psych:  flat affect.     Impression & Recommendations:  Problem # 1:  FIBROMYALGIA (ICD-729.1) Hx of fibromyalgia, Vit D def, depression, ongoing. Will change her from celexa to cymbalta (weaning down on the celexa and starting with low dose cymbalta). Trial of low dose Lyrica at night x 2 wks then go up to 2 x a day for pain. Add once daily Piroxicam in the AM.  Stay on Vit D 1,000 International Units/ day and recheck  level in 6 wks. Work on improving sleep, exercise, wt loss and continue massage. Her updated medication list for this problem includes:    Piroxicam 20 Mg Caps (Piroxicam) .Marland Kitchen... 1 capsule by mouth qam with food for muscle/ joint  pain  Complete Medication List: 1)  Zolpidem Tartrate 10 Mg Tabs (Zolpidem tartrate) .... Take by mouth once daily 2)  Omeprazole 40 Mg Cpdr (Omeprazole) .Marland Kitchen.. 1 tab by mouth daily 3)  Lisinopril-hydrochlorothiazide 20-25 Mg Tabs (Lisinopril-hydrochlorothiazide) .... Take by mouth once daily 4)  Combipatch 0.05-0.14 Mg/day Pttw (Estradiol-norethindrone acet) 5)  Align Caps (Probiotic product) 6)  Piroxicam 20 Mg Caps (Piroxicam) .Marland Kitchen.. 1 capsule by mouth qam with  food for muscle/ joint  pain 7)  Celexa 20 Mg Tabs (Citalopram hydrobromide) .... 1/2 tab by mouth daily x 7 days then stop 8)  Nasonex 50 Mcg/act Susp (Mometasone furoate) .... Use 2 sprays per nostril daily. 9)  Cymbalta 60 Mg Cpep (Duloxetine hcl) .Marland Kitchen.. 1 capsule by mouth daily 10)  Lyrica 50 Mg Caps (Pregabalin) .Marland Kitchen.. 1 capsule by mouth two times a day  Other Orders: Admin 1st Vaccine ([address]) Flu Vaccine 72yrs + ([address]8119) Flu Vaccine Consent Questions     Do you have a history of severe allergic reactions to this vaccine? no    Any prior history of allergic reactions to egg and/or gelatin? no    Do you have a sensitivity to the preservative Thimersol? no    Do you have a past history of Guillan-Barre Syndrome? no    Do you currently have an acute febrile illness? no    Have you ever had a severe reaction to latex? no    Vaccine information given and explained to patient? yes    Are you currently pregnant? no    Lot Number:[id number]   Exp Date:06/30/2011   Site Given  Left Deltoid IMccine 22yrs + ([address])  Patient Instructions: 1)  Wean off Celexa - cut in half for 7 days then STOP. 2)  Once Off of it, start on Cymbalta - 30 mg once daily x 1 wk then go up to 60 mg once daily. 3)  Start Lyrica AT BEDTIME ONLY FOR TWO WEEKS.  Then go up to 2 x a day as long as you aren't too tired. 4)  Use Piroxicam once daily for pain and inflammation. 5)  Continue massage and work on improving exercise. 6)  Return for [name]/u/ check Vitamin D level in 6 wks. Prescriptions: LYRICA 50 MG CAPS (PREGABALIN) 1 capsule by mouth two times a day  #60 x 2   Entered and Authorized by:   Seymour Bars DO   Signed by:   Seymour Bars DO on 10/11/2010   Method used:   Printed then faxed to ...       [facility] [address] [facility] [address] [facility] [address]       [address]       [place], [place]  [address]       Ph: [phone]       Fax: [phone]   RxID:   [id number] PIROXICAM 20 MG CAPS (PIROXICAM) 1 capsule by mouth qAM with  food for muscle/ joint  pain  #30 x 2   Entered and Authorized by:   Seymour Bars DO   Signed by:   Seymour Bars DO on 10/11/2010   Method used:   Electronically [facility]        [facility] [address] [facility]       [address]       [place], [place]  [address]       Ph: [phone]       Fax: [phone]   RxID:   [id number]    .lbflu

## 2011-01-30 NOTE — Letter (Signed)
Summary: [facility]  [facility]   Imported By: Lanelle Bal 11/27/2010 10:53:09  _____________________________________________________________________  External Attachment:    Type:   Image     Comment:   External Document

## 2011-01-30 NOTE — Progress Notes (Signed)
----   Converted from flag ---- ---- 06/21/2010 11:18 AM, Payton Spark CMA wrote: Pt would like to know if you planned on sending anything to pharm for back pain. Please advise. ------------------------------  Please let patient know that the best thing for her pain at this time in addition to the Meloxicam is physical therapy.  I have made referral.  Please call her and let her know. Will not change pain meds at this time.  She can try a heating pad as needed.  Appended Document:  Pt aware

## 2011-02-01 NOTE — Assessment & Plan Note (Signed)
Summary: f/u fibromyalgia   Vital Signs:  Patient profile:   60 year old female Height:      64.25 inches Weight:      213 pounds BMI:     36.41 O2 Sat:      96 % on Room air Pulse rate:   80 / minute BP sitting:   117 / 81  (left arm) Cuff size:   large  Vitals Entered By: Payton Spark CMA (December 20, 2010 9:31 AM)  O2 Flow:  Room air CC: F/U. Doing well on Lyrica. Also requests labs.   Primary Care Provider:  Seymour Bars DO  CC:  F/U. Doing well on Lyrica. Also requests labs..  History of Present Illness: 60 yo Sara [name] presents for f/u fibromyalgia.  She weaned off Celexa 2 mos ago and is doing well on Cymbalta now, up to 60 mg/ day.  She is taking Lyrica 50mg  only at bedtime and Piroxicam daily.  She is using Ambien at bedtime for sleep.  She reports feeling much better from a pain standpoint.  Her mood is improved.  She is due for FLP, Vit D level and a fsting sugar today.  She has lost 5 lbs, starting to exercise more.   Allergies: No Known Drug Allergies  Past History:  Past Medical History: Reviewed history from 10/11/2010 and no changes required. HTN postmenopausal bleeding Fibromyalgia, [name] gyn Dr Christella Hartigan  Past Surgical History: Reviewed history from 02/10/2010 and no changes required. R knee replacement 2000, Dr Shelle Iron breast biopsy 1980,neg  Social History: Reviewed history from 02/10/2010 and no changes required. 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  Has a grown child - daughter in [place] Never smoked. occas ETOH. Walks for exercise.  Review of Systems      See HPI  Physical Exam  General:  alert, well-developed, well-nourished, and well-hydrated.  obese Head:  normocephalic and atraumatic.   Mouth:  pharynx pink and moist.   Neck:  no masses.   Lungs:  Normal respiratory effort, chest expands symmetrically. Lungs are clear to auscultation, no crackles or wheezes. Heart:  Normal rate and regular rhythm. S1 and  S2 normal without gallop, murmur, click, rub or other extra sounds. Msk:  no joint swelling and no redness over joints.   Extremities:  no LE edema Skin:  color normal.   Psych:  good eye contact, not anxious appearing, and not depressed appearing.     Impression & Recommendations:  Problem # 1:  FIBROMYALGIA (ICD-729.1) Assessment Improved Improved with change to Cymbalta + Lyrica (will go up to 2 x a day since she was still only taking it at bedtime).  Using piroxicam once daily, going for regular massages and has started walking more.  Improved sleep with Ambien at night. Pt is happy with her results and reports 'much improvement' in her pain level.  Will continue to work on exercise and wt loss.  Update necessary labs today. Her updated medication list for this problem includes:    Piroxicam 20 Mg Caps (Piroxicam) .Marland Kitchen... 1 capsule by mouth qam with food for muscle/ joint  pain  Complete Medication List: 1)  Zolpidem Tartrate 10 Mg Tabs (Zolpidem tartrate) .... Take by mouth once daily 2)  Omeprazole 40 Mg Cpdr (Omeprazole) .Marland Kitchen.. 1 tab by mouth daily 3)  Lisinopril-hydrochlorothiazide 20-25 Mg Tabs (Lisinopril-hydrochlorothiazide) .... Take by mouth once daily 4)  Combipatch 0.05-0.14 Mg/day Pttw (Estradiol-norethindrone acet) 5)  Align Caps (Probiotic product) 6)  Piroxicam 20 Mg  Caps (Piroxicam) .Marland Kitchen.. 1 capsule by mouth qam with food for muscle/ joint  pain 7)  Nasonex 50 Mcg/act Susp (Mometasone furoate) .... Use 2 sprays per nostril daily. 8)  Cymbalta 60 Mg Cpep (Duloxetine hcl) .Marland Kitchen.. 1 capsule by mouth daily 9)  Lyrica 50 Mg Caps (Pregabalin) .Marland Kitchen.. 1 capsule by mouth two times a day  Other Orders: T-Lipid Profile ([id number]) T-Vitamin D (25-Hydroxy) [phone]) T-Glucose, Blood ([id number])  Patient Instructions: 1)  Go up to Lyrica to 50 mg twice a day.  Call me if it causes swelling or increased fatigue. 2)  Fasting labs today. 3)  Will call you w/ results tomorrow. 4)   Meds RFd. Prescriptions: LYRICA 50 MG CAPS (PREGABALIN) 1 capsule by mouth two times a day  #60 x 6   Entered and Authorized by:   Seymour Bars DO   Signed by:   Seymour Bars DO on 12/20/2010   Method used:   Printed then faxed to ...       [facility] [address] ([facility]ail[facility]       [address]       [place], [place]  [address]       Ph: [phone]       Fax: [phone]   RxID:   [id number] ZOLPIDEM TARTRATE 10 MG TABS (ZOLPIDEM TARTRATE) take by mouth once daily  #30 x 1   Entered and Authorized by:   Seymour Bars DO   Signed by:   Seymour Bars DO on 12/20/2010   Method used:   Printed then faxed to ...       [facility] [address] (retail)       [address]       [place], [place]  [address]       Ph: [phone]       Fax: [phone]   RxID:   [id number] CYMBALTA 60 MG CPEP (DULOXETINE HCL) 1 capsule by mouth daily  #30 x 6   Entered and Authorized by:   Seymour Bars DO   Signed by:   Seymour Bars DO on 12/20/2010   Method used:   Electronically to        [facility]       [address]       [place], [place]  [address]       Ph: [phone]       Fax: [phone]   RxID:   [id number] NASONEX 50 MCG/ACT SUSP (MOMETASONE FUROATE) Use 2 sprays per nostril daily.  #1 bottle x 1   Entered and Authorized by:   Seymour Bars DO   Signed by:   Seymour Bars DO on 12/20/2010   Method used:   Electronically to        [facility] ([facility]       [address]       [place], [place]  [address]       Ph: [phone]       Fax: [phone]   RxID:   [id number] LISINOPRIL-HYDROCHLOROTHIAZIDE 20-25 MG TABS (LISINOPRIL-HYDROCHLOROTHIAZIDE) take by mouth once daily  #30 x 6   Entered and Authorized by:   Seymour Bars DO   Signed by:   Seymour Bars DO on 12/20/2010   Method used:   Electronically to        [facility] ([facility])       [address]       [place], [place]  [address]  Ph: [phone]       Fax: [phone]    RxID:   [id number] PIROXICAM 20 MG CAPS (PIROXICAM) 1 capsule by mouth qAM with food for muscle/ joint  pain  #30 x 6   Entered and Authorized by:   Seymour Bars DO   Signed by:   Seymour Bars DO on 12/20/2010   Method used:   Electronically to        [facility] ([facility])       [address]       [place], [place]  [address]       Ph: [phone]       Fax: [phone]   RxID:   [id number]    Orders Added: 1)  T-Lipid Profile [phone] 2)  T-Vitamin D (25-Hydroxy) [phone] 3)  T-Glucose, Blood [[id number]] 4)  Est. Patient Level III [[address]]

## 2011-02-01 NOTE — Progress Notes (Signed)
Summary: needs a refill - jr   Phone Note Refill Request Message from:  Patient on January 25, 2011 8:55 AM  Needs a refill Ambien, states she left a message on the 22nd and went to get her Rx. [facility] [place] [facility] from our office.... Call patient and let her know [phone].Michaelle Copas  January 25, 2011 8:55 AM   Initial call taken by: Michaelle Copas,  January 25, 2011 8:55 AM    Prescriptions: ZOLPIDEM TARTRATE 10 MG TABS (ZOLPIDEM TARTRATE) take by mouth once daily  #30 x 3   Entered and Authorized by:   Seymour Bars DO   Signed by:   Seymour Bars DO on 01/25/2011   Method used:   Printed then faxed to ...       [facility] [address] (retail)       [address]       [place], [place]  [address]       Ph: [phone]       Fax: [phone]   RxID:   [id number]   Appended Document: needs a refill - jr  faxed

## 2011-02-01 NOTE — Letter (Signed)
Summary: [facility]  [facility]   Imported By: Lanelle Bal 12/21/2010 13:58:07  _____________________________________________________________________  External Attachment:    Type:   Image     Comment:   External Document

## 2011-02-10 ENCOUNTER — Encounter: Payer: Self-pay | Admitting: Emergency Medicine

## 2011-02-10 ENCOUNTER — Ambulatory Visit (INDEPENDENT_AMBULATORY_CARE_PROVIDER_SITE_OTHER): Payer: BC Managed Care – PPO | Admitting: Emergency Medicine

## 2011-02-10 DIAGNOSIS — J209 Acute bronchitis, unspecified: Secondary | ICD-10-CM | POA: Insufficient documentation

## 2011-02-15 NOTE — Assessment & Plan Note (Signed)
Summary: HEADACHE,COUGH,CHEST CONGESTION/WSE (rm 2)   Vital Signs:  Patient Profile:   60 Years Old Female CC:      dry cough, runny nose, hoarse, scratchy throat Height:     64.25 inches Weight:      223 pounds O2 Sat:      99 % O2 treatment:    Room Air Temp:     98.8 degrees F oral Pulse rate:   79 / minute Resp:     14 per minute BP sitting:   136 / 80  (left arm) Cuff size:   large  Vitals Entered By: Lajean Saver RN (February 10, 2011 3:38 PM)                  Updated Prior Medication List: ZOLPIDEM TARTRATE 10 MG TABS (ZOLPIDEM TARTRATE) take by mouth once daily OMEPRAZOLE 40 MG CPDR (OMEPRAZOLE) 1 tab by mouth daily LISINOPRIL-HYDROCHLOROTHIAZIDE 20-25 MG TABS (LISINOPRIL-HYDROCHLOROTHIAZIDE) take by mouth once daily COMBIPATCH 0.05-0.14 MG/DAY PTTW (ESTRADIOL-NORETHINDRONE ACET)  ALIGN  CAPS (PROBIOTIC PRODUCT)  PIROXICAM 20 MG CAPS (PIROXICAM) 1 capsule by mouth qAM with food for muscle/ joint  pain NASONEX 50 MCG/ACT SUSP (MOMETASONE FUROATE) Use 2 sprays per nostril daily. CYMBALTA 60 MG CPEP (DULOXETINE HCL) 1 capsule by mouth daily LYRICA 50 MG CAPS (PREGABALIN) 1 capsule by mouth two times a day  Current Allergies: No known allergies History of Present Illness Chief Complaint: dry cough, runny nose, hoarse, scratchy throat History of Present Illness: Pt complains of 2-3 days of worsening congestion.  Mild sore throat. + cough,nonproductive No dyspnea. No chest pain. No wheezing.  No nausea No vomiting. Low grade fever, No chills.  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  Eyes       Denies change in vision, eye pain, eye discharge, glasses, contact lenses, and eye surgery. Ear/Nose/Throat/Mouth       Complains of frequent runny nose and hoarseness.      Denies hearing loss/aids, change in hearing, ear  pain, ear discharge, dizziness, frequent nose bleeds, sinus problems, sore throat, and tooth pain or bleeding.      Comments: scratchy throat Respiratory       Complains of dry cough.      Denies productive cough, wheezing, shortness of breath, asthma, bronchitis, and emphysema/COPD.  Cardiovascular       Denies murmurs, chest pain, and tires easily with exhertion.    Gastrointestinal       Denies stomach pain, nausea/vomiting, diarrhea, constipation, blood in bowel movements, and indigestion. Genitourniary       Denies painful urination, kidney stones, and loss of urinary control. Neurological       Denies paralysis, seizures, and fainting/blackouts. Musculoskeletal       Denies muscle pain, joint pain, joint stiffness, decreased range of motion, redness, swelling, muscle weakness, and gout.  Skin       Denies bruising, unusual mles/lumps or sores, and hair/skin or nail changes.  Psych       Denies mood changes, temper/anger issues, anxiety/stress, speech problems, depression, and sleep problems.  Past History:  Past Medical History: Reviewed history from 10/11/2010 and no changes required. HTN postmenopausal bleeding Fibromyalgia, [name] gyn Dr Christella Hartigan  Past Surgical History: Reviewed history from 02/10/2010 and no changes required. R knee replacement 2000, Dr Shelle Iron breast biopsy 1980,neg  Family History: Reviewed  history from 02/10/2010 and no changes required. Mother, alive, DM Father died, war wounds, HTN, DM 2 brothers healthy  Social History: Reviewed history from 02/10/2010 and no changes required. 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 occas ETOH. Walks for exercise. Physical Exam General appearance: well developed, well nourished, no acute distress Head: normocephalic, atraumatic Eyes: conjunctivae and lids normal Ears: normal, no lesions or deformities Nasal: clear discharge,  mild congestion Oral/Pharynx: tongue normal, posterior pharynx without erythema or exudate Neck: supple, mild anterior lymphadenopathy present Chest/Lungs: Mild ant. rhonchi. No rales or wheezes, breath sounds equal without effort Heart: regular rate and  rhythm, no murmur Skin: no obvious rashes or lesions Assessment New Problems: BRONCHITIS, ACUTE (ICD-466.0)   Patient Education: Patient and/or caregiver instructed in the following: rest fluids and Tylenol. Advice given on other otc sxs care. Precautions discussed.  Plan New Medications/Changes: ZITHROMAX Z-PAK 250 MG TABS (AZITHROMYCIN) as directed  #1 x 0, 02/10/2011, Lajean Manes MD  New Orders: New Patient Level III [phone] Planning Comments:   I explained that this could be viral or bacterial or atypical cause of bronchitis. She requests to rx abx now. Risks, benefits, alternatives discussed. Pt voiced understanding and agreement.  Follow Up: Follow up with Primary Physician  The patient and/or caregiver has been counseled thoroughly with regard to medications prescribed including dosage, schedule, interactions, rationale for use, and possible side effects and they verbalize understanding.  Diagnoses and expected course of recovery discussed and will return if not improved as expected or if the condition worsens. Patient and/or caregiver verbalized understanding.  Prescriptions: ZITHROMAX Z-PAK 250 MG TABS (AZITHROMYCIN) as directed  #1 x 0   Entered and Authorized by:   Lajean Manes MD   Signed by:   Lajean Manes MD on 02/10/2011   Method used:   Handwritten   RxID:   [id number]   Orders Added: 1)  New Patient Level III [95621]

## 2011-02-21 ENCOUNTER — Ambulatory Visit (INDEPENDENT_AMBULATORY_CARE_PROVIDER_SITE_OTHER): Payer: BC Managed Care – PPO | Admitting: Family Medicine

## 2011-02-21 ENCOUNTER — Encounter: Payer: Self-pay | Admitting: Family Medicine

## 2011-02-21 DIAGNOSIS — IMO0001 Reserved for inherently not codable concepts without codable children: Secondary | ICD-10-CM

## 2011-02-21 DIAGNOSIS — S40029A Contusion of unspecified upper arm, initial encounter: Secondary | ICD-10-CM | POA: Insufficient documentation

## 2011-02-21 DIAGNOSIS — I10 Essential (primary) hypertension: Secondary | ICD-10-CM

## 2011-02-21 NOTE — Letter (Signed)
Summary: [facility]   [facility]   Imported By: Kassie Mends 02/15/2011 08:39:44  _____________________________________________________________________  External Attachment:    Type:   Image     Comment:   External Document

## 2011-02-27 NOTE — Assessment & Plan Note (Signed)
Summary: arm pain/ fibro   Vital Signs:  Patient profile:   60 year old female Height:      64.25 inches Weight:      220 pounds BMI:     37.61 O2 Sat:      98 % on Room air Pulse rate:   79 / minute BP sitting:   133 / 86  (left arm) Cuff size:   large  Vitals Entered By: Payton Spark CMA (February 21, 2011 3:33 PM)  O2 Flow:  Room air CC: Multiple complaints   Primary Care Provider:  Seymour Bars DO  CC:  Multiple complaints.  History of Present Illness: 60 yo AAF with fibromyalgia presents for f/u.  She feels like her fibromyalgia pain is worse even with Piroxicam, Lyrica and Cymbalta.  We recently tapered off Celexa and started Cymbalta 2 mos ago.  We also increased her Lyrica from 1--> 2 x a day 2 mos ago.  She c/o diffuse 'pain all over'.  She is yet to start exercising and has failed to lose wt.  Admits to some work induced stressors.  She would like to proceed with PT and massage as adjunctive treatments.    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  It is sore now and a little bruised but she can move it.  Denies any neck , head or chest wall pain.     Current Medications (verified): 1)  Zolpidem Tartrate 10 Mg Tabs (Zolpidem Tartrate) .... Take By Mouth Once Daily 2)  Omeprazole 40 Mg Cpdr (Omeprazole) .Marland Kitchen.. 1 Tab By Mouth Daily 3)  Lisinopril-Hydrochlorothiazide 20-25 Mg Tabs (Lisinopril-Hydrochlorothiazide) .... Take By Mouth Once Daily 4)  Combipatch 0.05-0.14 Mg/day Pttw (Estradiol-Norethindrone Acet) 5)  Align  Caps (Probiotic Product) 6)  Piroxicam 20 Mg Caps (Piroxicam) .Marland Kitchen.. 1 Capsule By Mouth Qam With Food For Muscle/ Joint  Pain 7)  Nasonex 50 Mcg/act Susp (Mometasone Furoate) .... Use 2 Sprays Per Nostril Daily. 8)  Cymbalta 60 Mg Cpep (Duloxetine Hcl) .Marland Kitchen.. 1 Capsule By Mouth Daily 9)  Lyrica 50 Mg Caps (Pregabalin) .Marland Kitchen.. 1  Capsule By Mouth Two Times A Day  Allergies (verified): No Known Drug Allergies  Past History:  Past Medical History: Reviewed history from 10/11/2010 and no changes required. HTN postmenopausal bleeding Fibromyalgia, [name] gyn Dr Christella Hartigan  Past Surgical History: Reviewed history from 02/10/2010 and no changes required. R knee replacement 2000, Dr Shelle Iron breast biopsy 1980,neg  Social History: Reviewed history from 02/10/2010 and no changes required. 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 occas ETOH. Walks for exercise.  Review of Systems      See HPI  Physical Exam  General:  alert, well-developed, well-nourished, and well-hydrated.  obese Head:  normocephalic and atraumatic.   Eyes:  wears glasses Neck:  supple.   Lungs:  Normal respiratory effort, chest expands symmetrically. Lungs are clear to auscultation, no crackles or wheezes. Heart:  Normal rate and regular rhythm. S1 and S2 normal without gallop, murmur, click, rub or other extra sounds. Msk:  full c spine, R glenohumeral, elbow and wrist ROM w/o pain.  full bicepts/ tricepts resisted strength.   Pulses:  2+ R ulnar and radial pulses Extremities:  no arm or forearm tenderness to touch Skin:  tiny bruise  over the proximal R forearm Psych:  good eye contact, not anxious appearing, and not depressed appearing.     Impression & Recommendations:  Problem # 1:  FIBROMYALGIA (ICD-729.1) Assessment Unchanged By hx, it sounds like there are factors that have affected Sara [name]'s improvement with her fibromyalgia despite inreasing her Cymbalta and Lyrica 2 mos ago.  Due to added stressors and the lack of exercise, she has failed to improve.  She agrees to try PT and massage.  I am not going to change her meds today.  We discussed a long term plan. Her updated medication list for this problem includes:    Piroxicam 20 Mg Caps (Piroxicam)  .Marland Kitchen... 1 capsule by mouth qam with food for muscle/ joint  pain  Orders: Physical Therapy Referral (PT)  Problem # 2:  CONTUSION OF UPPER ARM (ICD-923.03) Mild.  Fall onto arm at work today, sustaining some mild soft tissue trauma.  No sign of fracture or torn muslces on exam today.  Ice arm/ forearm on and off today and tomorrow.  Already on RX NSAID.  Problem # 3:  ESSENTIAL HYPERTENSION, BENIGN (ICD-401.1) BP OK.  Continue current meds.  Labs are UTD.   Her updated medication list for this problem includes:    Lisinopril-hydrochlorothiazide 20-25 Mg Tabs (Lisinopril-hydrochlorothiazide) .Marland Kitchen... Take by mouth once daily  BP today: 133/86 Prior BP: 136/80 (02/10/2011)  Labs Reviewed: K+: 3.6 (11/08/2010) Creat: : 0.87 (11/08/2010)   Chol: 186 (12/20/2010)   HDL: 51 (12/20/2010)   LDL: 122 (12/20/2010)   TG: 63 (12/20/2010)  Complete Medication List: 1)  Zolpidem Tartrate 10 Mg Tabs (Zolpidem tartrate) .... Take by mouth once daily 2)  Omeprazole 40 Mg Cpdr (Omeprazole) .Marland Kitchen.. 1 tab by mouth daily 3)  Lisinopril-hydrochlorothiazide 20-25 Mg Tabs (Lisinopril-hydrochlorothiazide) .... Take by mouth once daily 4)  Combipatch 0.05-0.14 Mg/day Pttw (Estradiol-norethindrone acet) 5)  Align Caps (Probiotic product) 6)  Piroxicam 20 Mg Caps (Piroxicam) .Marland Kitchen.. 1 capsule by mouth qam with food for muscle/ joint  pain 7)  Nasonex 50 Mcg/act Susp (Mometasone furoate) .... Use 2 sprays per nostril daily. 8)  Cymbalta 60 Mg Cpep (Duloxetine hcl) .Marland Kitchen.. 1 capsule by mouth daily 9)  Lyrica 50 Mg Caps (Pregabalin) .Marland Kitchen.. 1 capsule by mouth two times a day 10)  Massage Therapy  .... Dx: fibromyalgia  Patient Instructions: 1)  Ice the R arm and forearm on and off today and tomorrow. 2)  Continue current meds. 3)  Referral made to PT down the hall and RX for massage given. 4)  Return for f/u fibromyalgia pain in 2 mos. Prescriptions: MASSAGE THERAPY dx: fibromyalgia  #1 x 12   Entered and Authorized by:    Seymour Bars DO   Signed by:   Seymour Bars DO on 02/21/2011   Method used:   Printed then faxed to ...       [facility] [address] ([facility]ail)       [address]       [place], [place]  [address]       Ph: [phone]       Fax: [phone]   RxID:   [id number]    Orders Added: 1)  Physical Therapy Referral [PT] 2)  Est. Patient Level IV [02725]

## 2011-02-27 NOTE — Letter (Signed)
Summary: Out of Work  [facility]  [address], [address]   [place], [place] [address]   Phone: [phone]  Fax: [phone]    February 21, 2011   Employee:  AMARRAH MEINHART [employer]ucci    To Whom It May Concern:   For Medical reasons, please excuse the above named employee from work for the following dates:  Start:   Feb 22nd-- pt advised to ice R arm and forearm today and tomorrow  End:   Feb 23rd  If you need additional information, please feel free to contact our office.         Sincerely,    Seymour Bars DO

## 2011-03-01 ENCOUNTER — Ambulatory Visit: Payer: BC Managed Care – PPO | Attending: Family Medicine | Admitting: Physical Therapy

## 2011-03-01 DIAGNOSIS — M545 Low back pain, unspecified: Secondary | ICD-10-CM | POA: Insufficient documentation

## 2011-03-01 DIAGNOSIS — IMO0001 Reserved for inherently not codable concepts without codable children: Secondary | ICD-10-CM | POA: Insufficient documentation

## 2011-03-01 DIAGNOSIS — M546 Pain in thoracic spine: Secondary | ICD-10-CM | POA: Insufficient documentation

## 2011-03-01 DIAGNOSIS — Z96659 Presence of unspecified artificial knee joint: Secondary | ICD-10-CM | POA: Insufficient documentation

## 2011-03-01 DIAGNOSIS — M542 Cervicalgia: Secondary | ICD-10-CM | POA: Insufficient documentation

## 2011-03-05 ENCOUNTER — Ambulatory Visit: Payer: BC Managed Care – PPO | Admitting: Physical Therapy

## 2011-03-08 ENCOUNTER — Ambulatory Visit: Payer: BC Managed Care – PPO | Admitting: Physical Therapy

## 2011-03-09 ENCOUNTER — Telehealth: Payer: Self-pay | Admitting: Family Medicine

## 2011-03-12 ENCOUNTER — Ambulatory Visit: Payer: BC Managed Care – PPO | Admitting: Family Medicine

## 2011-03-12 ENCOUNTER — Ambulatory Visit: Payer: BC Managed Care – PPO | Admitting: Physical Therapy

## 2011-03-13 NOTE — Progress Notes (Signed)
Summary: Cough Syrup  Phone Note Call from Patient Call back at Work Phone [phone]   Caller: Patient Call For: Sara Bars DO Summary of Call: pt is requesting cough syrup.  advised as we were not the prescribing physician she will need OV to eval.  She will come for appt on Monday. Initial call taken by: Francee Piccolo CMA Duncan Dull),  March 09, 2011 4:28 PM

## 2011-03-15 ENCOUNTER — Encounter: Payer: BC Managed Care – PPO | Admitting: Physical Therapy

## 2011-03-22 ENCOUNTER — Ambulatory Visit: Payer: BC Managed Care – PPO | Admitting: Physical Therapy

## 2011-03-22 ENCOUNTER — Other Ambulatory Visit: Payer: Self-pay | Admitting: Family Medicine

## 2011-03-22 DIAGNOSIS — K219 Gastro-esophageal reflux disease without esophagitis: Secondary | ICD-10-CM

## 2011-03-23 ENCOUNTER — Telehealth: Payer: Self-pay | Admitting: *Deleted

## 2011-03-23 NOTE — Telephone Encounter (Signed)
Fibromyalgia and back pain.

## 2011-03-23 NOTE — Telephone Encounter (Signed)
For what diagnosis? Need this for insurance to pay for her TENS unit.

## 2011-03-23 NOTE — Telephone Encounter (Signed)
Pt states she is under chiropractic care and needs Rx for TINS UNIT

## 2011-03-26 ENCOUNTER — Other Ambulatory Visit: Payer: Self-pay | Admitting: Family Medicine

## 2011-03-26 ENCOUNTER — Ambulatory Visit: Payer: BC Managed Care – PPO | Admitting: Physical Therapy

## 2011-03-26 DIAGNOSIS — M797 Fibromyalgia: Secondary | ICD-10-CM

## 2011-03-26 MED ORDER — EMJOI TENS DEVI: Status: DC

## 2011-03-27 ENCOUNTER — Telehealth: Payer: Self-pay | Admitting: Family Medicine

## 2011-03-28 NOTE — Telephone Encounter (Signed)
Pt is already going to PT down the hall.  Does not need a new order.

## 2011-03-29 ENCOUNTER — Ambulatory Visit: Payer: BC Managed Care – PPO | Admitting: Physical Therapy

## 2011-04-04 ENCOUNTER — Ambulatory Visit: Payer: BC Managed Care – PPO | Attending: Specialist | Admitting: Physical Therapy

## 2011-04-04 ENCOUNTER — Ambulatory Visit: Payer: BC Managed Care – PPO | Attending: Family Medicine | Admitting: Physical Therapy

## 2011-04-04 DIAGNOSIS — M545 Low back pain, unspecified: Secondary | ICD-10-CM | POA: Insufficient documentation

## 2011-04-04 DIAGNOSIS — IMO0001 Reserved for inherently not codable concepts without codable children: Secondary | ICD-10-CM | POA: Insufficient documentation

## 2011-04-04 DIAGNOSIS — M25569 Pain in unspecified knee: Secondary | ICD-10-CM | POA: Insufficient documentation

## 2011-04-04 DIAGNOSIS — M25669 Stiffness of unspecified knee, not elsewhere classified: Secondary | ICD-10-CM | POA: Insufficient documentation

## 2011-04-04 DIAGNOSIS — Z96659 Presence of unspecified artificial knee joint: Secondary | ICD-10-CM | POA: Insufficient documentation

## 2011-04-04 DIAGNOSIS — M546 Pain in thoracic spine: Secondary | ICD-10-CM | POA: Insufficient documentation

## 2011-04-04 DIAGNOSIS — M6281 Muscle weakness (generalized): Secondary | ICD-10-CM | POA: Insufficient documentation

## 2011-04-04 DIAGNOSIS — M542 Cervicalgia: Secondary | ICD-10-CM | POA: Insufficient documentation

## 2011-04-12 ENCOUNTER — Ambulatory Visit: Payer: BC Managed Care – PPO | Admitting: Physical Therapy

## 2011-04-13 ENCOUNTER — Ambulatory Visit: Payer: BC Managed Care – PPO | Admitting: Physical Therapy

## 2011-04-16 ENCOUNTER — Ambulatory Visit
Admission: RE | Admit: 2011-04-16 | Discharge: 2011-04-16 | Disposition: A | Discharge status: Home / Self Care | Payer: BC Managed Care – PPO | Source: Ambulatory Visit | Attending: Orthopedic Surgery | Admitting: Orthopedic Surgery

## 2011-04-16 ENCOUNTER — Other Ambulatory Visit: Payer: Self-pay | Admitting: Orthopedic Surgery

## 2011-04-16 ENCOUNTER — Encounter: Payer: BC Managed Care – PPO | Admitting: Physical Therapy

## 2011-04-16 DIAGNOSIS — M545 Low back pain, unspecified: Secondary | ICD-10-CM

## 2011-04-16 DIAGNOSIS — M542 Cervicalgia: Secondary | ICD-10-CM

## 2011-04-18 ENCOUNTER — Encounter: Payer: BC Managed Care – PPO | Admitting: Physical Therapy

## 2011-04-23 ENCOUNTER — Encounter: Payer: Self-pay | Admitting: Family Medicine

## 2011-04-23 ENCOUNTER — Encounter: Payer: BC Managed Care – PPO | Admitting: Physical Therapy

## 2011-04-24 ENCOUNTER — Telehealth: Payer: Self-pay | Admitting: *Deleted

## 2011-04-24 NOTE — Telephone Encounter (Signed)
Pt states she has been having increased urgency and slight discomfort when urinating.  Denies odor or blood in urine.  Pt went to PT yesterday and has a herniated disk which may be causing some of her back pain.  Pt is unable to come in for nurse visit today. She will come in to pick up sample container and will drop back off.  Advised pt to keep sample refrigerated after collecting.

## 2011-04-26 ENCOUNTER — Encounter: Payer: BC Managed Care – PPO | Admitting: Physical Therapy

## 2011-05-09 ENCOUNTER — Other Ambulatory Visit: Payer: Self-pay | Admitting: Family Medicine

## 2011-05-09 DIAGNOSIS — G4720 Circadian rhythm sleep disorder, unspecified type: Secondary | ICD-10-CM

## 2011-05-09 MED ORDER — ZOLPIDEM TARTRATE 10 MG PO TABS: 10.0000 mg | ORAL_TABLET | Freq: Every evening | ORAL | Status: DC | PRN

## 2011-05-09 NOTE — Telephone Encounter (Signed)
Pt called and wants Rf of her Ambien 10 mg PO QHS for sleep.  [place] be proactive and call ahead of time.  NKDA. Plan:  Pt script for Ambien 10 mg PO # 30/2 [facility] [facility] confirming with Dr. Cathey Endow. Pt was instructed to call the pharmacy later to pup. Jarvis Newcomer, LPN Domingo Dimes

## 2011-06-03 ENCOUNTER — Encounter: Payer: Self-pay | Admitting: Family Medicine

## 2011-06-03 ENCOUNTER — Inpatient Hospital Stay (INDEPENDENT_AMBULATORY_CARE_PROVIDER_SITE_OTHER)
Admission: RE | Admit: 2011-06-03 | Discharge: 2011-06-03 | Disposition: A | Discharge status: Home / Self Care | Payer: BC Managed Care – PPO | Source: Ambulatory Visit | Attending: Family Medicine | Admitting: Family Medicine

## 2011-06-03 DIAGNOSIS — H6980 Other specified disorders of Eustachian tube, unspecified ear: Secondary | ICD-10-CM | POA: Insufficient documentation

## 2011-06-20 ENCOUNTER — Other Ambulatory Visit: Payer: Self-pay | Admitting: Family Medicine

## 2011-07-05 ENCOUNTER — Encounter: Payer: Self-pay | Admitting: Family Medicine

## 2011-07-05 ENCOUNTER — Ambulatory Visit (INDEPENDENT_AMBULATORY_CARE_PROVIDER_SITE_OTHER): Payer: BC Managed Care – PPO | Admitting: Family Medicine

## 2011-07-05 VITALS — BP 130/85 | HR 81 | Ht 65.0 in | Wt 215.0 lb

## 2011-07-05 DIAGNOSIS — R748 Abnormal levels of other serum enzymes: Secondary | ICD-10-CM

## 2011-07-05 DIAGNOSIS — R002 Palpitations: Secondary | ICD-10-CM

## 2011-07-05 DIAGNOSIS — D72829 Elevated white blood cell count, unspecified: Secondary | ICD-10-CM

## 2011-07-05 DIAGNOSIS — IMO0001 Reserved for inherently not codable concepts without codable children: Secondary | ICD-10-CM

## 2011-07-05 DIAGNOSIS — I10 Essential (primary) hypertension: Secondary | ICD-10-CM

## 2011-07-05 DIAGNOSIS — R5383 Other fatigue: Secondary | ICD-10-CM

## 2011-07-05 MED ORDER — METOPROLOL TARTRATE 25 MG PO TABS: 25.0000 mg | ORAL_TABLET | Freq: Two times a day (BID) | ORAL | Status: DC

## 2011-07-05 MED ORDER — LISINOPRIL 20 MG PO TABS: 20.0000 mg | ORAL_TABLET | Freq: Every day | ORAL | Status: DC

## 2011-07-05 NOTE — Progress Notes (Signed)
  Subjective:    Patient ID: Sara [name], female    DOB: 09-26-1951, 60 y.o.   MRN: [id number]  HPI 60 yo AAF presents for HFU visit.  She was at [facility] from Sat - Monday this week for heart palpitations.  She has had them in the past.  She has pain in her shoulder and felt a bit nauseated.  Her cardiac enzymes were high but her stress test done on 6-30 was normal.  She had been seen by [facility] in the past.  Her was sent home on Omeprazole.  She was told to f/u with cardiology.  Her BPs have been fairly well controlled on her meds.  She has continued to have palpitations after discharge but she is just taking aspirin.  She drinks one cup of coffee each morning.  She is off her cymbalta b/c it 'didn't work' and is not using her ativan.  Denies feeling anxious or having acute stressors.  She does have fibromyalgia and failed to keep her f/u with Dr Joycelyn Man in [place].  She is due to have her CBC and CK levels repeated.  She sees Dr Ethelene Hal and Dr Shon Baton for her back pain.    BP 130/85  Pulse 81  Ht 5\' 5"  (1.651 m)  Wt 215 lb (97.523 kg)  BMI 35.78 kg/m2  SpO2 97% Patient Active Problem List  Diagnoses  . INFECTIOUS COLITIS ENTERITIS AND GASTROENTERITIS  . Unspecified vitamin D deficiency  . LEUKOCYTOSIS  . ANXIETY  . ESSENTIAL HYPERTENSION, BENIGN  . ACUTE SINUSITIS, UNSPECIFIED  . ESOPHAGEAL REFLUX  . POSTMENOPAUSAL BLEEDING  . OSTEOARTHRITIS, MILD  . BACK PAIN  . FIBROMYALGIA  . INSOMNIA  . STRESS INCONTINENCE  . ABDOMINAL PAIN, GENERALIZED  . BRONCHITIS, ACUTE  . CONTUSION OF UPPER ARM  ;  Review of Systems  Constitutional: Positive for fatigue. Negative for fever and unexpected weight change.  Eyes: Negative for visual disturbance.  Respiratory: Negative for chest tightness and shortness of breath.   Cardiovascular: Positive for chest pain and palpitations. Negative for leg swelling.  Gastrointestinal: Negative for nausea, abdominal pain and diarrhea.  Musculoskeletal: Positive for  myalgias, back pain and arthralgias.  Neurological: Negative for dizziness, tremors and light-headedness.  Psychiatric/Behavioral: Negative for sleep disturbance and dysphoric mood. The patient is not nervous/anxious.        Objective:   Physical Exam  Constitutional: She appears well-developed and well-nourished. No distress.       obese  HENT:  Mouth/Throat: Oropharynx is clear and moist.  Eyes: Conjunctivae are normal. No scleral icterus.  Neck: Neck supple. No thyromegaly present.  Cardiovascular: Normal rate, regular rhythm and normal heart sounds.  Exam reveals no gallop and no friction rub.   No murmur heard. Pulmonary/Chest: Effort normal and breath sounds normal. No respiratory distress. She has no wheezes.  Musculoskeletal: She exhibits no edema.  Lymphadenopathy:    She has no cervical adenopathy.  Neurological:       No tremor  Skin: Skin is warm and dry.  Psychiatric: She has a normal mood and affect.          Assessment & Plan:

## 2011-07-05 NOTE — Assessment & Plan Note (Signed)
Cut HCTZ out of Lisinopril/ HCTZ given her rise in BUN and fatigue.  Change to plain Lisinopril 20 mg/ day and am adding metoprolol 25 mg bid for palpitations.

## 2011-07-05 NOTE — Assessment & Plan Note (Signed)
Reviewed her labs and stress test from recent hospitalization.  Her K+ looks like it was normalized in the hospital.  mag and thyroid were normal.  No anemia and no note of an arrythmia.  DDX includes PVCs, Psvt, anxiety, reflux, caffeine use.  Avoid caffeine.  Add metoprolol 25 mg bid for palpatitations.  Repeat labs today.  F/u with Dr Jens Som for an event monitor.  Referral made.  Can try Ativan prn for episodes in the case they are anxiety driven.

## 2011-07-05 NOTE — Patient Instructions (Addendum)
Labs today. Will call you w/ results tomorrow.  Will set you up with Dr Jens Som for cardiology f/u//// heart monitoring for palpitations.  Change Lisinopril/ HCTZ to plain Lisinopril 20 mg/ day. Add  Metoprolol 25 mg 2 x a day for palpitations ( and BP).  Stay on Omeprazole daily, 20 min before breakfast. Use Ativan as needed to help w/ palpitations.  Return for f/u in 2 mos.

## 2011-07-05 NOTE — Assessment & Plan Note (Signed)
Recheck CK level.  She has been seeing orhto for ongoing back pain , so the elevated CK may be from this.  She can always f/u with dr Joycelyn Man.  Repeat her B12 and Vit D level today.

## 2011-07-05 NOTE — Assessment & Plan Note (Signed)
Repeat CBC with diff today 

## 2011-07-06 ENCOUNTER — Telehealth: Payer: Self-pay | Admitting: Family Medicine

## 2011-07-06 LAB — CBC WITH DIFFERENTIAL/PLATELET
HCT: 45.8 % (ref 36.0–46.0)
Hemoglobin: 14.3 g/dL (ref 12.0–15.0)
Lymphocytes Relative: 35 % (ref 12–46)
Lymphs Abs: 3.7 10*3/uL (ref 0.7–4.0)
Monocytes Relative: 7 % (ref 3–12)
Neutro Abs: 6 10*3/uL (ref 1.7–7.7)
Neutrophils Relative %: 57 % (ref 43–77)
RBC: 4.53 MIL/uL (ref 3.87–5.11)

## 2011-07-06 LAB — BASIC METABOLIC PANEL WITH GFR
CO2: 26 mEq/L (ref 19–32)
Chloride: 99 mEq/L (ref 96–112)
GFR, Est African American: >60 mL/min (ref >60)
Glucose, Bld: 94 mg/dL (ref 70–99)
Potassium: 4.2 mEq/L (ref 3.5–5.3)
Sodium: 138 mEq/L (ref 135–145)

## 2011-07-06 LAB — CK TOTAL AND CKMB (NOT AT ARMC)
CK, MB: 2 ng/mL (ref 0.3–4.0)
Relative Index: 1.3 (ref 0.0–2.5)

## 2011-07-06 LAB — VITAMIN D 25 HYDROXY (VIT D DEFICIENCY, FRACTURES): Vit D, 25-Hydroxy: 56 ng/mL (ref 30–89)

## 2011-07-06 LAB — MAGNESIUM: Magnesium: 2.1 mg/dL (ref 1.5–2.5)

## 2011-07-06 NOTE — Telephone Encounter (Signed)
Pls let pt know that all of her labs came back normal -- blood counts, sugar, kidney function, B12, Vit D, Magnesium and CK.  Fax to

## 2011-07-06 NOTE — Telephone Encounter (Signed)
Pt aware of the above  

## 2011-07-06 NOTE — Telephone Encounter (Signed)
Fax to Dr Joycelyn Man - rheumatology in [place].  Dr Jens Som can see these for her upcoming appt.  Nothing to explain her palpitations.

## 2011-07-23 ENCOUNTER — Encounter: Payer: Self-pay | Admitting: Cardiology

## 2011-07-25 ENCOUNTER — Ambulatory Visit (INDEPENDENT_AMBULATORY_CARE_PROVIDER_SITE_OTHER): Payer: BC Managed Care – PPO | Admitting: Cardiology

## 2011-07-25 ENCOUNTER — Encounter: Payer: Self-pay | Admitting: Cardiology

## 2011-07-25 DIAGNOSIS — R079 Chest pain, unspecified: Secondary | ICD-10-CM | POA: Insufficient documentation

## 2011-07-25 DIAGNOSIS — R002 Palpitations: Secondary | ICD-10-CM

## 2011-07-25 DIAGNOSIS — I10 Essential (primary) hypertension: Secondary | ICD-10-CM

## 2011-07-25 DIAGNOSIS — R072 Precordial pain: Secondary | ICD-10-CM

## 2011-07-25 MED ORDER — HYDROCHLOROTHIAZIDE 12.5 MG PO CAPS: 12.5000 mg | ORAL_CAPSULE | Freq: Every day | ORAL | Status: DC

## 2011-07-25 NOTE — Progress Notes (Signed)
HPI: 60 year old female for evaluation of chest pain and palpitations. Admitted to [facility] in early July with palpitations. TSH and potassium normal. Troponin I normal. Nuclear study in June of 2012 showed an ejection fraction of 84% and normal perfusion. Patient has had intermittent palpitations for 7-8 months. These are described as her heart racing for approximately 45 seconds. They are sudden in onset and not related to activity. There is no associated syncope but she has mild dizziness, chest pain, shortness of breath and nausea. They resolve spontaneously. The frequency increased prior to her hospitalization. She has been placed on metoprolol and had improved but she still has them once weekly. She does have mild dyspnea on exertion but no orthopnea or PND. No exertional chest pain. Mild pedal edema that has increased recently after her HCTZ was discontinued. Because of the above we were asked to further evaluate.  Current Outpatient Prescriptions  Medication Sig Dispense Refill  . aspirin 81 MG tablet Take 81 mg by mouth daily.        . COMBIPATCH 0.05-0.14 MG/DAY       . gabapentin (NEURONTIN) 300 MG capsule Take 300 mg by mouth 3 (three) times daily.        Marland Kitchen lisinopril (PRINIVIL,ZESTRIL) 20 MG tablet Take 1 tablet (20 mg total) by mouth daily.  30 tablet  2  . LORazepam (ATIVAN) 1 MG tablet       . metoprolol tartrate (LOPRESSOR) 25 MG tablet Take 1 tablet (25 mg total) by mouth 2 (two) times daily.  60 tablet  2  . NASONEX 50 MCG/ACT nasal spray       . omeprazole (PRILOSEC) 40 MG capsule TAKE ONE CAPSULE BY MOUTH DAILY  30 capsule  2  . zolpidem (AMBIEN) 10 MG tablet Take 1 tablet (10 mg total) by mouth at bedtime as needed for sleep.  30 tablet  2  . hydrochlorothiazide (,MICROZIDE/HYDRODIURIL,) 12.5 MG capsule Take 1 capsule (12.5 mg total) by mouth daily.  30 capsule  12    No Known Allergies  Past Medical History  Diagnosis Date  . Hypertension   . Postmenopausal  bleeding   . Fibromyalgia     [name]    Past Surgical History  Procedure Date  . Total knee arthroplasty 2000    Right, Dr. Shelle Iron  . Breast biopsy 1980    neg    History   Social History  . Marital Status: Married    Spouse Name: N/A    Number of Children: 4  . Years of Education: N/A   Occupational History  . 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   Social History Main Topics  . Smoking status: Never Smoker   . Smokeless tobacco: Not on file  . Alcohol Use: Yes     Occasional  . Drug Use: Not on file  . Sexually Active: Not on file   Other Topics Concern  . Not on file   Social History Narrative   Married to [place]. Has grown children, daughter in [place].Walks for exercise.    Family History  Problem Relation Age of Onset  . Diabetes Mother   . Hypertension Father   . Diabetes Father   . Heart attack Mother     First MI at age 35    ROS: arthritis but no fevers or chills, productive cough, hemoptysis, dysphasia, odynophagia, melena, hematochezia, dysuria, hematuria, rash, seizure activity, orthopnea, PND, pedal edema, claudication. Remaining systems are negative.  Physical Exam:  General:  Well developed/obese in NAD Skin warm/dry Patient not depressed No peripheral clubbing Back-normal HEENT-normal/normal eyelids Neck supple/normal carotid upstroke bilaterally; no bruits; no JVD; no thyromegaly chest - CTA/ normal expansion CV - RRR/normal S1 and S2; no murmurs, rubs or gallops;  PMI nondisplaced Abdomen -NT/ND, no HSM, no mass, + bowel sounds, no bruit 2+ femoral pulses, no bruits Ext-trace edema, no chords, 2+ DP Neuro-grossly nonfocal  ECG normal sinus rhythm with no ST changes.

## 2011-07-25 NOTE — Assessment & Plan Note (Signed)
Continue beta blocker. Symptoms persist although improved. Schedule Cardiolite. Check echocardiogram. Question history of mitral valve prolapse.

## 2011-07-25 NOTE — Patient Instructions (Addendum)
Your physician recommends that you schedule a follow-up appointment in: 6 WEEKS  Your physician has requested that you have an echocardiogram. Echocardiography is a painless test that uses sound waves to create images of your heart. It provides your doctor with information about the size and shape of your heart and how well your heart's chambers and valves are working. This procedure takes approximately one hour. There are no restrictions for this procedure.   Your physician has recommended that you wear an event monitor. Event monitors are medical devices that record the heart's electrical activity. Doctors most often [place] these monitors to diagnose arrhythmias. Arrhythmias are problems with the speed or rhythm of the heartbeat. The monitor is a small, portable device. You can wear one while you do your normal daily activities. This is usually used to diagnose what is causing palpitations/syncope (passing out).   Your physician recommends that you return for lab work in: ONE WEEK    START HCTZ 12.5MG  ONCE DAILY

## 2011-07-25 NOTE — Assessment & Plan Note (Signed)
Symptoms atypical and only associated with palpitations. Recent Myoview negative. No further ischemia evaluation.

## 2011-07-25 NOTE — Assessment & Plan Note (Signed)
Blood pressure mildly elevated. Patient also complaining of increased edema and weight gain since HCTZ discontinued. Will resume at 12.5 mg daily. Check potassium and renal function in one week.

## 2011-07-26 ENCOUNTER — Ambulatory Visit (INDEPENDENT_AMBULATORY_CARE_PROVIDER_SITE_OTHER): Payer: BC Managed Care – PPO | Admitting: Family Medicine

## 2011-07-26 ENCOUNTER — Encounter: Payer: Self-pay | Admitting: Family Medicine

## 2011-07-26 VITALS — BP 134/90 | HR 77 | Ht 65.0 in | Wt 227.0 lb

## 2011-07-26 DIAGNOSIS — I10 Essential (primary) hypertension: Secondary | ICD-10-CM

## 2011-07-26 DIAGNOSIS — IMO0001 Reserved for inherently not codable concepts without codable children: Secondary | ICD-10-CM

## 2011-07-26 DIAGNOSIS — R609 Edema, unspecified: Secondary | ICD-10-CM

## 2011-07-26 DIAGNOSIS — R319 Hematuria, unspecified: Secondary | ICD-10-CM | POA: Insufficient documentation

## 2011-07-26 DIAGNOSIS — R600 Localized edema: Secondary | ICD-10-CM | POA: Insufficient documentation

## 2011-07-26 LAB — POCT URINALYSIS DIPSTICK
Glucose, UA: NEGATIVE
Ketones, UA: NEGATIVE
Spec Grav, UA: 1.02

## 2011-07-26 MED ORDER — MELOXICAM 15 MG PO TABS: 15.0000 mg | ORAL_TABLET | Freq: Every day | ORAL | Status: DC

## 2011-07-26 MED ORDER — FUROSEMIDE 20 MG PO TABS: 20.0000 mg | ORAL_TABLET | Freq: Every day | ORAL | Status: DC

## 2011-07-26 MED ORDER — MILNACIPRAN HCL 50 MG PO TABS: 50.0000 mg | ORAL_TABLET | Freq: Two times a day (BID) | ORAL | Status: DC

## 2011-07-26 NOTE — Progress Notes (Signed)
  Subjective:    Patient ID: Sara [name], female    DOB: Sep 07, 1951, 60 y.o.   MRN: [id number]  HPI  60 yo AAF presents for 12 lb weight gain, a rise in BP and leg swelling x 2 wks after stopping HCTZ on 7-5.  I stopped it due to c/o fatigue and bump in her BUN.  She is voiding normally.  Denies dietary changes.  Legs feel a little sore.  Denies CP or DOE.  Heart palpitations have improved on metoprolol and she just saw Dr Jens Som yesterday.  She is set up for an echo and a cardiolite.  She just had additional labs drawn by rheumatology.  She was told to try [facility] but she was scared to for her fibromyalgia.  Continues to c/o fatigue and muscle pains.  She is only using Aleve from time to time.  She is not exercising.  Denies recent bleeding with intercourse or a bloody vaginal discharge.  BP 134/90  Pulse 77  Ht 5\' 5"  (1.651 m)  Wt 227 lb (102.967 kg)  BMI 37.77 kg/m2  SpO2 98%    Review of Systems as per HPI    Objective:   Physical Exam  Constitutional: She appears well-developed and well-nourished. No distress.       obese  HENT:  Mouth/Throat: Oropharynx is clear and moist.  Eyes: Conjunctivae are normal.  Neck: Neck supple. No JVD present. No thyromegaly present.  Cardiovascular: Normal rate, regular rhythm, normal heart sounds and intact distal pulses.   No murmur heard. Pulmonary/Chest: Effort normal and breath sounds normal. No respiratory distress. She has no rales.  Musculoskeletal: She exhibits edema (1+ pitting leg edema bilat). She exhibits no tenderness.  Skin: Skin is warm and dry.       No stasis dermatitis or skin breakdown          Assessment & Plan:

## 2011-07-26 NOTE — Assessment & Plan Note (Signed)
Wt gain of 12 lbs in the past 3 wks.  Dr Jens Som restarted her HCTZ 12.5 mg/ day.  I will add Furosemide 20 mg/ day for 7 days for now.  She will eat 1 banana per day while on this.  Elevated legs and stick to a low sodium diet.  Call if weight is not approaching 215 in the next 2 wks.  UA is neg for proteinuria.  She is already set to have K+ and Cr rechecked with Dr Jens Som.

## 2011-07-26 NOTE — Patient Instructions (Signed)
Take Furosemide 20 mg once a day for 7 days for swelling.  Elevate legs during the day.  Stay on HCTZ 12.5 mg/  Day everyday.  Add Savella pack and f/u with Rheumatology.  Use Meloxicam for pain/ inflammation.  This replaces aleve, ibuprofen, advil, etc.  Urology referral made for blood in the urine.  Let me know if weight is not close to 215 in the next 2 wks.

## 2011-07-26 NOTE — Assessment & Plan Note (Signed)
Mod blood in urine today in the absence of symptoms of UTI, vag discharge or bleeding,recent intercourse or leukocytes on UA.  She reports 'blood in her urine' on UA x 2 yrs and has never had a urologic w/u for this.    Will get her in with urology for further eval/ tx.

## 2011-07-26 NOTE — Assessment & Plan Note (Signed)
D/w pt the plan of care for her fibromyalgia.  She has seen [facility] for this and is agreeable to start [place].  Starter pack (samples) given today along with RX though it may not be covered.  Add once dialy meloxicam for pain/ inflammation instead of OTC NSAIDs.  F/u with rheum.  Long term plan to add reglar exercise enforced.

## 2011-07-26 NOTE — Assessment & Plan Note (Signed)
BP still a little high but she is fluid overloaded.  She is back on HCTZ per Dr Jens Som and will be using Lasix 20 mg/ day for 7 days.  Should see  A  Drop in her BP at f/u.  Call if any problems.

## 2011-07-27 ENCOUNTER — Telehealth: Payer: Self-pay

## 2011-07-27 NOTE — Telephone Encounter (Signed)
Patient will have monitor put on the same day of her ECHO 07/30/11

## 2011-07-30 ENCOUNTER — Other Ambulatory Visit (INDEPENDENT_AMBULATORY_CARE_PROVIDER_SITE_OTHER): Payer: BC Managed Care – PPO | Admitting: *Deleted

## 2011-07-30 ENCOUNTER — Encounter (INDEPENDENT_AMBULATORY_CARE_PROVIDER_SITE_OTHER): Payer: BC Managed Care – PPO

## 2011-07-30 ENCOUNTER — Ambulatory Visit (HOSPITAL_COMMUNITY): Payer: BC Managed Care – PPO | Attending: Cardiology | Admitting: Radiology

## 2011-07-30 DIAGNOSIS — I10 Essential (primary) hypertension: Secondary | ICD-10-CM | POA: Insufficient documentation

## 2011-07-30 DIAGNOSIS — R002 Palpitations: Secondary | ICD-10-CM | POA: Insufficient documentation

## 2011-07-30 DIAGNOSIS — R072 Precordial pain: Secondary | ICD-10-CM

## 2011-07-30 DIAGNOSIS — E669 Obesity, unspecified: Secondary | ICD-10-CM | POA: Insufficient documentation

## 2011-07-30 LAB — BASIC METABOLIC PANEL
BUN: 24 mg/dL — ABNORMAL HIGH (ref 6–23)
Chloride: 104 mEq/L (ref 96–112)
Glucose, Bld: 89 mg/dL (ref 70–99)
Potassium: 3.3 mEq/L — ABNORMAL LOW (ref 3.5–5.1)

## 2011-08-01 ENCOUNTER — Telehealth: Payer: Self-pay | Admitting: Family Medicine

## 2011-08-01 ENCOUNTER — Telehealth: Payer: Self-pay | Admitting: *Deleted

## 2011-08-01 ENCOUNTER — Encounter: Payer: Self-pay | Admitting: *Deleted

## 2011-08-01 DIAGNOSIS — E876 Hypokalemia: Secondary | ICD-10-CM

## 2011-08-01 MED ORDER — POTASSIUM CHLORIDE CRYS ER 20 MEQ PO TBCR: 20.0000 meq | EXTENDED_RELEASE_TABLET | Freq: Every day | ORAL | Status: DC

## 2011-08-01 NOTE — Telephone Encounter (Signed)
Pt called and is saying she was seen last week, and she was changed from lisinopril without the diuretic.  Placed on Lasix instead.  Then seen Dr. Jens Som and he placed pt on HCTZ.  Now pt is calling and wanting to know if she can go back to the original med with the combination she had been on in the past??  FYI  Dr. Jens Som also placed on potass supp. Routed to Dr. Arlice Colt, LPN [facility]

## 2011-08-01 NOTE — Telephone Encounter (Signed)
Message copied by Freddi Starr on Wed Aug 01, 2011  8:41 AM ------      Message from: Lewayne Bunting      Created: Mon Jul 30, 2011  5:27 PM       kcl 20 meq daily; bmet one week      Olga Millers

## 2011-08-02 MED ORDER — LISINOPRIL-HYDROCHLOROTHIAZIDE 20-25 MG PO TABS: 1.0000 | ORAL_TABLET | Freq: Every day | ORAL | Status: DC

## 2011-08-02 NOTE — Telephone Encounter (Signed)
I went ahead and changed her back to Lisinopril / HCTZ and sent to pharmacy.  Stop daily lasix once back on this and f/u K+ lab with Dr Jens Som for short term use of potassium supplement.

## 2011-08-02 NOTE — Telephone Encounter (Signed)
Pt notified with instructions to restart the lisinopril HCTZ and to stop the lasix when restarting.  Also, need to follow back up with Dr. Jerrye Beavers to repeat the potassium level for the short usage of the medication.   Had to [facility] with these instructions for the pt. Jarvis Newcomer, LPN [facility]

## 2011-08-06 ENCOUNTER — Telehealth: Payer: Self-pay | Admitting: Cardiology

## 2011-08-06 ENCOUNTER — Encounter: Payer: Self-pay | Admitting: Cardiology

## 2011-08-06 NOTE — Telephone Encounter (Signed)
Per pt call, pt was told last week, that pt would receive paperwork to take to the lab in the mail. Pt said she has yet to receive this information in the mail. Pt said she doesn't mind coming into this office for blood work please return pt call to advise/discuss.

## 2011-08-06 NOTE — Telephone Encounter (Signed)
Spoke with pt, she will let me know if order not received before Friday  Sara [name]

## 2011-08-07 ENCOUNTER — Telehealth: Payer: Self-pay | Admitting: Family Medicine

## 2011-08-07 MED ORDER — FUROSEMIDE 20 MG PO TABS: ORAL_TABLET | ORAL | Status: DC

## 2011-08-07 NOTE — Telephone Encounter (Signed)
OK to restart Lasix 20 mg as needed for leg swelling (she may not need it EVERYday).  She does need to stay on the potassium tab on the days that she takes the Lasix.    Return for f/u swelling and BMP in 2 mos.

## 2011-08-07 NOTE — Telephone Encounter (Signed)
Pt called and feels she is still retaining fluid.  Was taken off the lisinopril originally, and Dr. Cathey Endow then prescribed a Rx for lasix for 5 days.  At that time she was at 212#.  The Dr. Cathey Endow put back on lisinopril HCTZ once lasix was completed.  Pt still feels she is having fluid retention.  She is waiting on the paperwork orders to come in the mail from Dr. Ludwig Clarks office so she can repeat labs.  Retaining some swelling in feet/ankles, and feels her fingers are some swollen because her rings feel more snug.  Please advise.  Current weight today is 221#, and BP this am was 108/77. Routed to Dr. Arlice Colt, LPN [facility]

## 2011-08-07 NOTE — Telephone Encounter (Signed)
Pt notified of MD instructions and that med sent. KJ LPN

## 2011-08-13 ENCOUNTER — Other Ambulatory Visit: Payer: Self-pay | Admitting: Cardiology

## 2011-08-13 ENCOUNTER — Other Ambulatory Visit: Payer: Self-pay | Admitting: *Deleted

## 2011-08-13 ENCOUNTER — Telehealth: Payer: Self-pay | Admitting: Cardiology

## 2011-08-13 NOTE — Telephone Encounter (Signed)
Status of paperwork - lab work.

## 2011-08-13 NOTE — Telephone Encounter (Signed)
Spoke with pt, she did not receive lab order in the mail.  Order faxed to [facility] lab in [place].  [phone].

## 2011-08-14 LAB — BASIC METABOLIC PANEL
CO2: 28 mEq/L (ref 19–32)
Calcium: 9.7 mg/dL (ref 8.4–10.5)
Glucose, Bld: 102 mg/dL — ABNORMAL HIGH (ref 70–99)
Sodium: 140 mEq/L (ref 135–145)

## 2011-08-15 ENCOUNTER — Other Ambulatory Visit: Payer: Self-pay | Admitting: *Deleted

## 2011-08-15 DIAGNOSIS — G4720 Circadian rhythm sleep disorder, unspecified type: Secondary | ICD-10-CM

## 2011-08-15 MED ORDER — ZOLPIDEM TARTRATE 10 MG PO TABS: 10.0000 mg | ORAL_TABLET | Freq: Every evening | ORAL | Status: DC | PRN

## 2011-08-28 ENCOUNTER — Encounter: Payer: Self-pay | Admitting: Cardiology

## 2011-08-29 ENCOUNTER — Encounter: Payer: Self-pay | Admitting: Cardiology

## 2011-08-29 ENCOUNTER — Ambulatory Visit (INDEPENDENT_AMBULATORY_CARE_PROVIDER_SITE_OTHER): Payer: BC Managed Care – PPO | Admitting: Cardiology

## 2011-08-29 DIAGNOSIS — I10 Essential (primary) hypertension: Secondary | ICD-10-CM

## 2011-08-29 DIAGNOSIS — R079 Chest pain, unspecified: Secondary | ICD-10-CM

## 2011-08-29 DIAGNOSIS — R002 Palpitations: Secondary | ICD-10-CM

## 2011-08-29 MED ORDER — METOPROLOL TARTRATE 50 MG PO TABS: 50.0000 mg | ORAL_TABLET | Freq: Two times a day (BID) | ORAL | Status: DC

## 2011-08-29 MED ORDER — HYDROCHLOROTHIAZIDE 12.5 MG PO CAPS: 12.5000 mg | ORAL_CAPSULE | Freq: Every day | ORAL | Status: DC

## 2011-08-29 NOTE — Progress Notes (Signed)
HPI:60 year old female I initially saw in July of 2012 for evaluation of chest pain and palpitations. Admitted to [facility] in early July with palpitations. TSH and potassium normal. Troponin I normal. Nuclear study in June of 2012 showed an ejection fraction of 84% and normal perfusion. Echo in July of 2012 showed normal LV function and grade 1 diastolic dysfunction. Cardionet showed sinus rhythm. Since she was last seen, she does not have dyspnea on exertion, orthopnea, PND, syncope or chest pain. Her pedal edema has improved with HCTZ. She continues to have palpitations described as her heart racing. She wonders whether anxiety may be contributing.   Current Outpatient Prescriptions  Medication Sig Dispense Refill  . aspirin 81 MG tablet Take 81 mg by mouth daily.        . COMBIPATCH 0.05-0.14 MG/DAY       . gabapentin (NEURONTIN) 300 MG capsule Take 300 mg by mouth 3 (three) times daily.        Marland Kitchen lisinopril-hydrochlorothiazide (PRINZIDE,ZESTORETIC) 20-25 MG per tablet Take 1 tablet by mouth daily.  30 tablet  6  . LORazepam (ATIVAN) 1 MG tablet as needed.       . Milnacipran (SAVELLA) 50 MG TABS Take 1 tablet (50 mg total) by mouth 2 (two) times daily.  60 tablet  3  . NASONEX 50 MCG/ACT nasal spray as needed.       Marland Kitchen omeprazole (PRILOSEC) 40 MG capsule TAKE ONE CAPSULE BY MOUTH DAILY  30 capsule  2  . potassium chloride SA (K-DUR,KLOR-CON) 20 MEQ tablet Take 1 tablet (20 mEq total) by mouth daily.  30 tablet  12  . zolpidem (AMBIEN) 10 MG tablet Take 1 tablet (10 mg total) by mouth at bedtime as needed for sleep.  30 tablet  2  . meloxicam (MOBIC) 15 MG tablet Take 1 tablet (15 mg total) by mouth daily.  30 tablet  2     Past Medical History  Diagnosis Date  . Hypertension   . Postmenopausal bleeding   . Fibromyalgia     [name]    Past Surgical History  Procedure Date  . Total knee arthroplasty 2000    Right, Dr. Shelle Iron  . Breast biopsy 1980    neg    History    Social History  . Marital Status: Married    Spouse Name: N/A    Number of Children: 4  . Years of Education: N/A   Occupational History  . 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   Social History Main Topics  . Smoking status: Never Smoker   . Smokeless tobacco: Not on file  . Alcohol Use: Yes     Occasional  . Drug Use: Not on file  . Sexually Active: Not on file   Other Topics Concern  . Not on file   Social History Narrative   Married to [place]. Has grown children, daughter in [place].Walks for exercise.    ROS: no fevers or chills, productive cough, hemoptysis, dysphasia, odynophagia, melena, hematochezia, dysuria, hematuria, rash, seizure activity, orthopnea, PND, pedal edema, claudication. Remaining systems are negative.  Physical Exam: Well-developed obese in no acute distress.  Skin is warm and dry.  HEENT is normal.  Neck is supple. No thyromegaly.  Chest is clear to auscultation with normal expansion.  Cardiovascular exam is regular rate and rhythm.  Abdominal exam nontender or distended. No masses palpated. Extremities show no edema. neuro grossly intact  ECG Sinus tachycardia with no ST changes.

## 2011-08-29 NOTE — Assessment & Plan Note (Signed)
No further symptoms. Previous Myoview negative.

## 2011-08-29 NOTE — Assessment & Plan Note (Signed)
Patient continues to have palpitations. Myoview and echocardiogram unremarkable. Monitor showed sinus rhythm. She was having palpitations in the office today and her electrocardiogram revealed sinus tachycardia. There may be a component of anxiety. Increase metoprolol to 50 mg b.i.d.

## 2011-08-29 NOTE — Assessment & Plan Note (Signed)
I will increase her metoprolol for blood pressure and palpitations. Continue HCTZ but discontinue lisinopril.

## 2011-08-29 NOTE — Patient Instructions (Signed)
Your physician wants you to follow-up in: 6 MONTHS You will receive a reminder letter in the mail two months in advance. If you don't receive a letter, please call our office to schedule the follow-up appointment.   INCREASE METOPROLOL TART 50 MG ONE TABLET TWICE DAILY  STOP LISINOPRIL/HCTZ   START HCTZ 12.5 MG ONCE DAILY

## 2011-09-05 ENCOUNTER — Encounter: Payer: Self-pay | Admitting: Family Medicine

## 2011-09-05 ENCOUNTER — Ambulatory Visit (INDEPENDENT_AMBULATORY_CARE_PROVIDER_SITE_OTHER): Payer: BC Managed Care – PPO | Admitting: Family Medicine

## 2011-09-05 VITALS — BP 126/86 | HR 83 | Wt 225.0 lb

## 2011-09-05 DIAGNOSIS — I10 Essential (primary) hypertension: Secondary | ICD-10-CM

## 2011-09-05 DIAGNOSIS — M797 Fibromyalgia: Secondary | ICD-10-CM

## 2011-09-05 DIAGNOSIS — R000 Tachycardia, unspecified: Secondary | ICD-10-CM

## 2011-09-05 DIAGNOSIS — IMO0001 Reserved for inherently not codable concepts without codable children: Secondary | ICD-10-CM

## 2011-09-05 DIAGNOSIS — R609 Edema, unspecified: Secondary | ICD-10-CM

## 2011-09-05 MED ORDER — POTASSIUM CHLORIDE CRYS ER 20 MEQ PO TBCR: 20.0000 meq | EXTENDED_RELEASE_TABLET | Freq: Every day | ORAL | Status: DC

## 2011-09-05 MED ORDER — TORSEMIDE 20 MG PO TABS: 20.0000 mg | ORAL_TABLET | Freq: Every day | ORAL | Status: DC

## 2011-09-13 ENCOUNTER — Encounter: Payer: Self-pay | Admitting: Family Medicine

## 2011-09-13 NOTE — Progress Notes (Signed)
  Subjective:    Patient ID: Sara [name], female    DOB: 06-09-1951, 60 y.o.   MRN: [id number]  HPI Patient is here for follow up.She had been in the hospital recently for palpitation. She was released and placed on a beta blocker. She has had swelling in her legs which she was concerened about. She is taking HCTZ but that has not been enough to control her edama   Review of Systems  Constitutional: Positive for activity change and fatigue. Negative for unexpected weight change.  Respiratory: Negative for cough, choking, chest tightness and shortness of breath.   Cardiovascular: Positive for palpitations and leg swelling. Negative for chest pain.  Musculoskeletal: Positive for myalgias, back pain and arthralgias.  Skin: Negative.        Objective:   Physical Exam  Constitutional: She appears well-developed and well-nourished.       BF  HENT:  Head: Normocephalic.  Neck: Normal range of motion. Neck supple.  Cardiovascular: Normal rate, regular rhythm, normal heart sounds and intact distal pulses.   Pulmonary/Chest: Effort normal and breath sounds normal. No respiratory distress. She has no wheezes. She has no rales.  Abdominal: Soft.  Musculoskeletal: Normal range of motion. She exhibits edema and tenderness.  Neurological: She is alert.  Skin: Skin is warm and dry.  Psychiatric: She has a normal mood and affect.          Assessment & Plan:  Discussed options in length about multiple medical problems Will place on Demadex w/instructions to take K+ when ever she takes her demadex

## 2011-09-14 ENCOUNTER — Other Ambulatory Visit: Payer: Self-pay | Admitting: Family Medicine

## 2011-09-14 MED ORDER — OMEPRAZOLE 40 MG PO CPDR: 40.0000 mg | DELAYED_RELEASE_CAPSULE | Freq: Every day | ORAL | Status: DC

## 2011-09-24 ENCOUNTER — Other Ambulatory Visit: Payer: Self-pay | Admitting: *Deleted

## 2011-09-24 NOTE — Telephone Encounter (Signed)
Pt called and said she was recently seen and given diuretic by Dr. Thurmond Butts to take.  Had to go to ED due to fall at work and her BP there was 159/82.  She was told to follow up with her PCP.  Med changed to metoprolol 50 mg BID.  Pt says she is experiencing a lot of marital problems and school stress and feels this is the culprit to her high BP.  Wants to know if Dr. Thurmond Butts will write her out of work for couple of weeks til she can have time to get herself together. Plan:  LMOM that pt needs to follow up office and re-eval BP since went to ED.  Will be able to write the work note during the office visit. Jarvis Newcomer, LPN [facility]

## 2011-09-27 ENCOUNTER — Ambulatory Visit (INDEPENDENT_AMBULATORY_CARE_PROVIDER_SITE_OTHER): Payer: BC Managed Care – PPO | Admitting: Family Medicine

## 2011-09-27 VITALS — BP 154/98 | HR 96 | Wt 226.0 lb

## 2011-09-27 DIAGNOSIS — R600 Localized edema: Secondary | ICD-10-CM | POA: Insufficient documentation

## 2011-09-27 DIAGNOSIS — I10 Essential (primary) hypertension: Secondary | ICD-10-CM

## 2011-09-27 DIAGNOSIS — S8011XA Contusion of right lower leg, initial encounter: Secondary | ICD-10-CM | POA: Insufficient documentation

## 2011-09-27 DIAGNOSIS — Z23 Encounter for immunization: Secondary | ICD-10-CM

## 2011-09-27 DIAGNOSIS — F4320 Adjustment disorder, unspecified: Secondary | ICD-10-CM

## 2011-09-27 DIAGNOSIS — S8010XA Contusion of unspecified lower leg, initial encounter: Secondary | ICD-10-CM

## 2011-09-27 DIAGNOSIS — R609 Edema, unspecified: Secondary | ICD-10-CM

## 2011-09-27 MED ORDER — LISINOPRIL-HYDROCHLOROTHIAZIDE 10-12.5 MG PO TABS: 1.0000 | ORAL_TABLET | Freq: Every day | ORAL | Status: DC

## 2011-09-27 NOTE — Patient Instructions (Addendum)
Follow up at next regular visit in October. Start lisinopril HCTZ . Stop HCTZ. Use mobic 1/2 to 1 tablet po a  day for leg contusion careful it may cause more swelling. If more swelling occurs may use demadex daily. Continue w/counciling w/ family member.

## 2011-09-27 NOTE — Assessment & Plan Note (Signed)
She may take her mobic 1/2 to 1 po  Every other day as needed for leg pain

## 2011-09-27 NOTE — Assessment & Plan Note (Signed)
Discussed w/patient family stressors including a missing cousin, financial pressure and stress between her and her husband. She has a relative doing counciling but feels that she needs a few days to deal with this. Work note until 10/03/2011 given

## 2011-09-27 NOTE — Progress Notes (Signed)
Subjective:    Patient ID: Sara [name], female    DOB: 1951-04-05, 60 y.o.   MRN: [id number]  HPI Patient is here for several issues.  (1) Increased BP. She states she had her R leg checked after a fall and found her BP was elevated.  She was taken off lisinopril when placed on lopressor 2 x a day for her BP.  (2) Edema of the leg and weight gain> she has associated her weight gain w/edema but she is not exercising due to the stresses she is under. Her marriage is having some major financial upheavals and she is getting counciling from a physician she was related to by marriage via phone.  (3)R leg contusion. Patient fell about a week ago and still having considerable pain and discomfort.  Review of Systems  Constitutional: Positive for activity change and fatigue. Negative for appetite change.  Respiratory: Negative for cough, choking, chest tightness, shortness of breath and wheezing.   Cardiovascular: Negative for chest pain.  Musculoskeletal: Positive for myalgias, joint swelling, arthralgias and gait problem.  Skin:       R knee w/brusing  Psychiatric/Behavioral: Positive for sleep disturbance and dysphoric mood. The patient is nervous/anxious.    BP 154/98  Pulse 96  Wt 226 lb (102.513 kg) No Known Allergies History   Social History  . Marital Status: Married    Spouse Name: N/A    Number of Children: 4  . Years of Education: N/A   Occupational History  . 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   Social History Main Topics  . Smoking status: Never Smoker   . Smokeless tobacco: Not on file  . Alcohol Use: Yes     Occasional  . Drug Use: Not on file  . Sexually Active: Not on file   Other Topics Concern  . Not on file   Social History Narrative   Married to [place]. Has grown children, daughter in [place].Walks for exercise.   Outpatient Encounter Prescriptions as of 09/27/2011  Medication Sig Dispense Refill  . aspirin 81 MG tablet Take 81 mg by mouth  daily.        . COMBIPATCH 0.05-0.14 MG/DAY       . gabapentin (NEURONTIN) 300 MG capsule Take 300 mg by mouth 3 (three) times daily.        Marland Kitchen lisinopril-hydrochlorothiazide (PRINZIDE) 10-12.5 MG per tablet Take 1 tablet by mouth daily.  30 tablet  6  . LORazepam (ATIVAN) 1 MG tablet as needed.       . meloxicam (MOBIC) 15 MG tablet Take 1 tablet (15 mg total) by mouth daily.  30 tablet  2  . metoprolol (LOPRESSOR) 50 MG tablet Take 1 tablet (50 mg total) by mouth 2 (two) times daily.  60 tablet  11  . Milnacipran (SAVELLA) 50 MG TABS Take 1 tablet (50 mg total) by mouth 2 (two) times daily.  60 tablet  3  . NASONEX 50 MCG/ACT nasal spray as needed.       Marland Kitchen omeprazole (PRILOSEC) 40 MG capsule Take 1 capsule (40 mg total) by mouth daily.  30 capsule  2  . potassium chloride SA (K-DUR,KLOR-CON) 20 MEQ tablet Take 1 tablet (20 mEq total) by mouth daily. OR ONLY WHEN TAKING DEMADEX OR FUROSEMIDE  30 tablet  12  . torsemide (DEMADEX) 20 MG tablet Take 1 tablet (20 mg total) by mouth daily. Or every other day  30 tablet  6  . zolpidem (  AMBIEN) 10 MG tablet Take 1 tablet (10 mg total) by mouth at bedtime as needed for sleep.  30 tablet  2  . DISCONTD: hydrochlorothiazide (,MICROZIDE/HYDRODIURIL,) 12.5 MG capsule Take 1 capsule (12.5 mg total) by mouth daily.  30 capsule  12       Objective:   Physical Exam  Constitutional: She is oriented to [name], place, and time. She appears well-developed and well-nourished.  HENT:  Head: Normocephalic and atraumatic.  Cardiovascular: Normal rate, regular rhythm and normal heart sounds.   Pulmonary/Chest: Effort normal and breath sounds normal. No respiratory distress. She has no wheezes.  Musculoskeletal: She exhibits no edema.  Neurological: She is alert and oriented to [name], place, and time.  Skin: Skin is warm and dry. No rash noted.       brusing over R knee old          Assessment & Plan:

## 2011-09-27 NOTE — Assessment & Plan Note (Signed)
Hypertension poorly controled. Continue w/metropol twice aday 50mg  . Stop HCTZ. Continue demadex every other day for now follow up as scheduled in October

## 2011-09-27 NOTE — Assessment & Plan Note (Signed)
If swelling does get worse you may increase demadex to daily.

## 2011-09-28 ENCOUNTER — Other Ambulatory Visit: Payer: Self-pay | Admitting: *Deleted

## 2011-10-14 ENCOUNTER — Inpatient Hospital Stay (INDEPENDENT_AMBULATORY_CARE_PROVIDER_SITE_OTHER)
Admission: RE | Admit: 2011-10-14 | Discharge: 2011-10-14 | Disposition: A | Discharge status: Home / Self Care | Payer: BC Managed Care – PPO | Source: Ambulatory Visit | Attending: Family Medicine | Admitting: Family Medicine

## 2011-10-14 ENCOUNTER — Encounter: Payer: Self-pay | Admitting: Family Medicine

## 2011-10-14 DIAGNOSIS — J45909 Unspecified asthma, uncomplicated: Secondary | ICD-10-CM

## 2011-10-14 DIAGNOSIS — J309 Allergic rhinitis, unspecified: Secondary | ICD-10-CM

## 2011-10-14 DIAGNOSIS — J069 Acute upper respiratory infection, unspecified: Secondary | ICD-10-CM | POA: Insufficient documentation

## 2011-10-14 DIAGNOSIS — J449 Chronic obstructive pulmonary disease, unspecified: Secondary | ICD-10-CM | POA: Insufficient documentation

## 2011-10-14 DIAGNOSIS — H6980 Other specified disorders of Eustachian tube, unspecified ear: Secondary | ICD-10-CM

## 2011-10-22 ENCOUNTER — Telehealth (INDEPENDENT_AMBULATORY_CARE_PROVIDER_SITE_OTHER): Payer: Self-pay | Admitting: *Deleted

## 2011-10-23 ENCOUNTER — Ambulatory Visit: Payer: BC Managed Care – PPO | Admitting: Family Medicine

## 2011-11-01 ENCOUNTER — Encounter: Payer: Self-pay | Admitting: Family Medicine

## 2011-11-01 ENCOUNTER — Ambulatory Visit (INDEPENDENT_AMBULATORY_CARE_PROVIDER_SITE_OTHER): Payer: BC Managed Care – PPO | Admitting: Family Medicine

## 2011-11-01 VITALS — BP 122/85 | HR 89 | Ht 67.0 in | Wt 225.0 lb

## 2011-11-01 DIAGNOSIS — F411 Generalized anxiety disorder: Secondary | ICD-10-CM

## 2011-11-01 DIAGNOSIS — E785 Hyperlipidemia, unspecified: Secondary | ICD-10-CM

## 2011-11-01 DIAGNOSIS — G4720 Circadian rhythm sleep disorder, unspecified type: Secondary | ICD-10-CM

## 2011-11-01 DIAGNOSIS — F430 Acute stress reaction: Secondary | ICD-10-CM

## 2011-11-01 DIAGNOSIS — F419 Anxiety disorder, unspecified: Secondary | ICD-10-CM

## 2011-11-01 DIAGNOSIS — E876 Hypokalemia: Secondary | ICD-10-CM

## 2011-11-01 DIAGNOSIS — F4389 Other reactions to severe stress: Secondary | ICD-10-CM

## 2011-11-01 DIAGNOSIS — F438 Other reactions to severe stress: Secondary | ICD-10-CM

## 2011-11-01 MED ORDER — MELOXICAM 15 MG PO TABS: 15.0000 mg | ORAL_TABLET | Freq: Every day | ORAL | Status: DC | PRN

## 2011-11-01 MED ORDER — CLONAZEPAM 1 MG PO TABS: 1.0000 mg | ORAL_TABLET | Freq: Two times a day (BID) | ORAL | Status: DC | PRN

## 2011-11-01 MED ORDER — ZOLPIDEM TARTRATE 10 MG PO TABS: 10.0000 mg | ORAL_TABLET | Freq: Every evening | ORAL | Status: DC | PRN

## 2011-11-01 NOTE — Patient Instructions (Signed)
Hypokalemia Hypokalemia means a low potassium level in the blood.Potassium is an electrolyte that helps regulate the amount of fluid in the body. It also stimulates muscle contraction and maintains a stable acid-base balance.Most of the body's potassium is inside of cells, and only a very small amount is in the blood. Because the amount in the blood is so small, minor changes can have big effects. PREPARATION FOR TEST Testing for potassium requires taking a blood sample taken by needle from a vein in the arm. The skin is cleaned thoroughly before the sample is drawn. There is no other special preparation needed. NORMAL VALUES Potassium levels below 3.5 mEq/L are abnormally low. Levels above 5.1 mEq/L are abnormally high. Ranges for normal findings may vary among different laboratories and hospitals. You should always check with your doctor after having lab work or other tests done to discuss the meaning of your test results and whether your values are considered within normal limits. MEANING OF TEST  Your caregiver will go over the test results with you and discuss the importance and meaning of your results, as well as treatment options and the need for additional tests, if necessary. A potassium level is frequently part of a routine medical exam. It is usually included as part of a whole "panel" of tests for several blood salts (such as Sodium and Chloride). It may be done as part of follow-up when a low potassium level was found in the past or other blood salts are suspected of being out of balance. A low potassium level might be suspected if you have one or more of the following: Symptoms of weakness.  Abnormal heart rhythms.  High blood pressure and are taking medication to control this, especially water pills (diuretics).  Kidney disease that can affect your potassium level .  Diabetes requiring the use of insulin. The potassium may fall after taking insulin, especially if the diabetes had  been out of control for a while.  A condition requiring the use of cortisone-type medication or certain types of antibiotics.  Vomiting and/or diarrhea for more than a day or two.  A stomach or intestinal condition that may not permit appropriate absorption of potassium.  Fainting episodes.  Mental confusion.  OBTAINING TEST RESULTS It is your responsibility to obtain your test results. Ask the lab or department performing the test when and how you will get your results.  Please contact your caregiver directly if you have not received the results within one week. At that time, ask if there is anything different or new you should be doing in relation to the results. TREATMENT Hypokalemia can be treated with potassium supplements taken by mouth and/or adjustments in your current medications. A diet high in potassium is also helpful. Foods with high potassium content are: Peas, lentils, lima beans, nuts, and dried fruit.  Whole grain and bran cereals and breads.  Fresh fruit, vegetables (bananas, cantaloupe, grapefruit, oranges, tomatoes, honeydew melons, potatoes).  Orange and tomato juices.  Meats. If potassium supplement has been prescribed for you today or your medications have been adjusted, see your personal caregiver in time02 for a re-check.  SEEK MEDICAL CARE IF: There is a feeling of worsening weakness.  You experience repeated chest palpitations.  You are diabetic and having difficulty keeping your blood sugars in the normal range.  You are experiencing vomiting and/or diarrhea.  You are having difficulty with any of your regular medications.  SEEK IMMEDIATE MEDICAL CARE IF: You experience chest pain, shortness of breath,  or episodes of dizziness.  You have been having vomiting or diarrhea for more than 2 days.  You have a fainting episode.  MAKE SURE YOU:  Understand these instructions.  Will watch your condition.  Will get help right away if you are not doing well or get  worse.  Document Released: 12/17/2005 Document Revised: 08/29/2011 Document Reviewed: 11/27/2008 [facility] Patient Information 2012 [place], [place].Edema Edema is an abnormal build-up of fluids in tissues. Because this is partly dependent on gravity (water flows to the lowest place), it is more common in the leg sand thighs (lower extremities). It is also common in the looser tissues, like around the eyes. Painless swelling of the feet and ankles is common and increases as a person ages. It may affect both legs and may include the calves or even thighs. When squeezed, the fluid may move out of the affected area and may leave a dent for a few moments. CAUSES   Prolonged standing or sitting in one place for extended periods of time. Movement helps pump tissue fluid into the veins, and absence of movement prevents this, resulting in edema.   Varicose veins. The valves in the veins do not work as well as they should. This causes fluid to leak into the tissues.   Fluid and salt overload.   Injury, burn, or surgery to the leg, ankle, or foot, may damage veins and allow fluid to leak out.   Sunburn damages vessels. Leaky vessels allow fluid to go out into the sunburned tissues.   Allergies (from insect bites or stings, medications or chemicals) cause swelling by allowing vessels to become leaky.   Protein in the blood helps keep fluid in your vessels. Low protein, as in malnutrition, allows fluid to leak out.   Hormonal changes, including pregnancy and menstruation, cause fluid retention. This fluid may leak out of vessels and cause edema.   Medications that cause fluid retention. Examples are sex hormones, blood pressure medications, steroid treatment, or anti-depressants.   Some illnesses cause edema, especially heart failure, kidney disease, or liver disease.   Surgery that cuts veins or lymph nodes, such as surgery done for the heart or for breast cancer, may result in edema.  DIAGNOSIS  Your  caregiver is usually easily able to determine what is causing your swelling (edema) by simply asking what is wrong (getting a history) and examining you (doing a physical). Sometimes x-rays, EKG (electrocardiogram or heart tracing), and blood work may be done to evaluate for underlying medical illness. TREATMENT  General treatment includes:  Leg elevation (or elevation of the affected body part).   Restriction of fluid intake.   Prevention of fluid overload.   Compression of the affected body part. Compression with elastic bandages or support stockings squeezes the tissues, preventing fluid from entering and forcing it back into the blood vessels.   Diuretics (also called water pills or fluid pills) pull fluid out of your body in the form of increased urination. These are effective in reducing the swelling, but can have side effects and must be used only under your caregiver's supervision. Diuretics are appropriate only for some types of edema.  The specific treatment can be directed at any underlying causes discovered. Heart, liver, or kidney disease should be treated appropriately. HOME CARE INSTRUCTIONS   Elevate the legs (or affected body part) above the level of the heart, while lying down.   Avoid sitting or standing still for prolonged periods of time.   Avoid putting anything directly  under the knees when lying down, and do not wear constricting clothing or garters on the upper legs.   Exercising the legs causes the fluid to work back into the veins and lymphatic channels. This may help the swelling go down.   The pressure applied by elastic bandages or support stockings can help reduce ankle swelling.   A low-salt diet may help reduce fluid retention and decrease the ankle swelling.   Take any medications exactly as prescribed.  SEEK MEDICAL CARE IF:  Your edema is not responding to recommended treatments. SEEK IMMEDIATE MEDICAL CARE IF:   You develop shortness of breath or  chest pain.   You cannot breathe when you lay down; or if, while lying down, you have to get up and go to the window to get your breath.   You are having increasing swelling without relief from treatment.   You develop a fever over 102 F (38.9 C).   You develop pain or redness in the areas that are swollen.   Tell your caregiver right away if you have gained 3 lb/1.4 kg in 1 day or 5 lb/2.3 kg in a week.  MAKE SURE YOU:   Understand these instructions.   Will watch your condition.   Will get help right away if you are not doing well or get worse.  Document Released: 12/17/2005 Document Revised: 08/29/2011 Document Reviewed: 08/04/2008 [facility] Patient Information 2012 [place], [place].Anxiety and Panic Attacks Your caregiver has informed you that you are having an anxiety or panic attack. There may be many forms of this. Most of the time these attacks come suddenly and without warning. They come at any time of day, including periods of sleep, and at any time of life. They may be strong and unexplained. Although panic attacks are very scary, they are physically harmless. Sometimes the cause of your anxiety is not known. Anxiety is a protective mechanism of the body in its fight or flight mechanism. Most of these perceived danger situations are actually nonphysical situations (such as anxiety over losing a job). CAUSES  The causes of an anxiety or panic attack are many. Panic attacks may occur in otherwise healthy people given a certain set of circumstances. There may be a genetic cause for panic attacks. Some medications may also have anxiety as a side effect. SYMPTOMS  Some of the most common feelings are:  Intense terror.   Dizziness, feeling faint.   Hot and cold flashes.   Fear of going crazy.   Feelings that nothing is real.   Sweating.   Shaking.   Chest pain or a fast heartbeat (palpitations).   Smothering, choking sensations.   Feelings of impending doom and that  death is near.   Tingling of extremities, this may be from over-breathing.   Altered reality (derealization).   Being detached from yourself (depersonalization).  Several symptoms can be present to make up anxiety or panic attacks. DIAGNOSIS  The evaluation by your caregiver will depend on the type of symptoms you are experiencing. The diagnosis of anxiety or panic attack is made when no physical illness can be determined to be a cause of the symptoms. TREATMENT  Treatment to prevent anxiety and panic attacks may include:  Avoidance of circumstances that cause anxiety.   Reassurance and relaxation.   Regular exercise.   Relaxation therapies, such as yoga.   Psychotherapy with a psychiatrist or therapist.   Avoidance of caffeine, alcohol and illegal drugs.   Prescribed medication.  SEEK IMMEDIATE MEDICAL  CARE IF:   You experience panic attack symptoms that are different than your usual symptoms.   You have any worsening or concerning symptoms.  Document Released: 12/17/2005 Document Revised: 08/29/2011 Document Reviewed: 04/20/2010 [facility] Patient Information 2012 [place], [place].

## 2011-11-01 NOTE — Progress Notes (Signed)
  Subjective:    Patient ID: Sara Sara [name], female    DOB: 05-05-1951, 60 y.o.   MRN: [id number]  HPI  #1 hyperkalemia we'll need to check a potassium #2 patient reports that her Demadex on a daily basis apparently she stopped taking it when we restart lisinopril HCT see explain to her that she has to take the Sara Sara [name] but still increased and she is swelling #3 hypertension she states that with the Cipro her blood pressure has been better she is happy about that #4 disturbance anxiety her cousin was found dead in [place]. He was a loose death her dog standing by her body she apparently left the car actually health in a ditch and was watching her. By having resolution to this problem and issue she feels better as well as per the situation with her husband she's been able to accept the fact that if there are. assess in her name  she is able now to retire. She is turning her nose as of February 1 and plans to enjoy her life. Despite this he does feel that she needs something for nerves and depressed not work well for her. She has taken Ativan for the past which initially seemed to help but then would run out early in the day getting her feeling anxious again. Review of Systems  All other systems reviewed and are negative.       Objective:   Physical Exam  Constitutional: She is oriented to Sara [name], place, and time. She appears well-developed and well-nourished.  Neck: Normal range of motion. Neck supple.  Cardiovascular: Normal rate, regular rhythm and normal heart sounds.   Neurological: She is alert and oriented to Sara [name], place, and time.  Skin: Skin is warm and dry.  Psychiatric: Her speech is normal and behavior is normal. Thought content normal. Her mood appears anxious.  no appreciable edema today.        Assessment & Plan:  #1 Place her on Klonopin 1 mg one half to one tablet once or twice daily prn .#2 sleep disorder she was refilled Ambien we'll give her that refill  #3  fibromyalgia she's not happy with the current metentdication for fibromyalgia and  the side effects  will acquiesce to that request. Will renew mobic for pain #4 hypertension wll continue w/lisinopril under good control. #5 hyperlipidemia we'll obtain a lipid panel

## 2011-11-05 LAB — LIPID PANEL: LDL Cholesterol: 120 mg/dL — ABNORMAL HIGH (ref 0–99)

## 2011-11-06 ENCOUNTER — Ambulatory Visit: Payer: BC Managed Care – PPO | Admitting: Family Medicine

## 2011-11-06 LAB — BASIC METABOLIC PANEL WITH GFR
BUN: 19 mg/dL (ref 6–23)
CO2: 30 mEq/L (ref 19–32)
Chloride: 102 mEq/L (ref 96–112)
GFR, Est Non African American: 65 mL/min — ABNORMAL LOW (ref >89)
Potassium: 4.6 mEq/L (ref 3.5–5.3)

## 2011-11-08 ENCOUNTER — Other Ambulatory Visit: Payer: Self-pay | Admitting: *Deleted

## 2011-11-08 DIAGNOSIS — G4720 Circadian rhythm sleep disorder, unspecified type: Secondary | ICD-10-CM

## 2011-11-08 MED ORDER — ZOLPIDEM TARTRATE 10 MG PO TABS: 10.0000 mg | ORAL_TABLET | Freq: Every evening | ORAL | Status: DC | PRN

## 2011-12-02 ENCOUNTER — Other Ambulatory Visit: Payer: Self-pay | Admitting: Family Medicine

## 2011-12-03 NOTE — Letter (Signed)
Summary: Out of Work  [facility]  [address]   [place], [place] [address]   Phone: [phone]  Fax: [phone]    October 14, 2011   Employee:  Sara [name]    To Whom It May Concern:   For Medical reasons, please excuse the above named employee from work tomorrow.   If you need additional information, please feel free to contact our office.         Sincerely,    Donna Christen MD

## 2011-12-03 NOTE — Progress Notes (Signed)
Summary: ? ear infx(rm5)   Vital Signs:  Patient Profile:   60 Years Old Female CC:      ear infection Height:     64.25 inches Weight:      217 pounds BMI:     37.09 O2 Sat:      95 % O2 treatment:    Room Air Temp:     98.8 degrees F oral Pulse rate:   87 / minute Resp:     18 per minute BP sitting:   124 / 83  (left arm) Cuff size:   regular  Vitals Entered By: Linton Flemings RN (June 03, 2011 1:14 PM)                  Prior Medication List:  ZOLPIDEM TARTRATE 10 MG TABS (ZOLPIDEM TARTRATE) take by mouth once daily OMEPRAZOLE 40 MG CPDR (OMEPRAZOLE) 1 tab by mouth daily LISINOPRIL-HYDROCHLOROTHIAZIDE 20-25 MG TABS (LISINOPRIL-HYDROCHLOROTHIAZIDE) take by mouth once daily COMBIPATCH 0.05-0.14 MG/DAY PTTW (ESTRADIOL-NORETHINDRONE ACET)  ALIGN  CAPS (PROBIOTIC PRODUCT)  PIROXICAM 20 MG CAPS (PIROXICAM) 1 capsule by mouth qAM with food for muscle/ joint  pain NASONEX 50 MCG/ACT SUSP (MOMETASONE FUROATE) Use 2 sprays per nostril daily. CYMBALTA 60 MG CPEP (DULOXETINE HCL) 1 capsule by mouth daily LYRICA 50 MG CAPS (PREGABALIN) 1 capsule by mouth two times a day * MASSAGE THERAPY dx: fibromyalgia   Updated Prior Medication List: ZOLPIDEM TARTRATE 10 MG TABS (ZOLPIDEM TARTRATE) take by mouth once daily OMEPRAZOLE 40 MG CPDR (OMEPRAZOLE) 1 tab by mouth daily LISINOPRIL-HYDROCHLOROTHIAZIDE 20-25 MG TABS (LISINOPRIL-HYDROCHLOROTHIAZIDE) take by mouth once daily COMBIPATCH 0.05-0.14 MG/DAY PTTW (ESTRADIOL-NORETHINDRONE ACET)  * MASSAGE THERAPY dx: fibromyalgia  Current Allergies: No known allergies History of Present Illness Chief Complaint: ear infection History of Present Illness: Patient is here for R ear pain that is gettting progresssivel;y worse last 72 hours. She has felt pressure behind the R ear is not relieved by any thing that she has done.  Current Problems: EUSTACHIAN TUBE DYSFUNCTION, RIGHT (ICD-381.81) CONTUSION OF UPPER ARM (ICD-923.03) BRONCHITIS, ACUTE  (ICD-466.0) SCREENING FOR DIABETES MELLITUS (ICD-V77.1) SCREENING FOR LIPOID DISORDERS (ICD-V77.91) ABDOMINAL PAIN, GENERALIZED (ICD-789.07) FIBROMYALGIA (ICD-729.1) VITAMIN D DEFICIENCY (ICD-268.9) BACK PAIN (ICD-724.5) ANXIETY (ICD-300.00) ACUTE SINUSITIS, UNSPECIFIED (ICD-461.9) INFECTIOUS COLITIS ENTERITIS AND GASTROENTERITIS (ICD-009.0) LEUKOCYTOSIS (ICD-288.60) STRESS INCONTINENCE (ICD-788.39) OSTEOARTHRITIS, MILD (ICD-715.90) INSOMNIA (ICD-780.52) ESOPHAGEAL REFLUX (ICD-530.81) ESSENTIAL HYPERTENSION, BENIGN (ICD-401.1) POSTMENOPAUSAL BLEEDING (ICD-627.1) UNSPECIFIED VITAMIN D DEFICIENCY (ICD-268.9)   Current Meds ZOLPIDEM TARTRATE 10 MG TABS (ZOLPIDEM TARTRATE) take by mouth once daily OMEPRAZOLE 40 MG CPDR (OMEPRAZOLE) 1 tab by mouth daily LISINOPRIL-HYDROCHLOROTHIAZIDE 20-25 MG TABS (LISINOPRIL-HYDROCHLOROTHIAZIDE) take by mouth once daily COMBIPATCH 0.05-0.14 MG/DAY PTTW (ESTRADIOL-NORETHINDRONE ACET)  * MASSAGE THERAPY dx: fibromyalgia AUGMENTIN 875-125 MG TABS (AMOXICILLIN-POT CLAVULANATE) 1 by mouth 2 x a day NASONEX 50 MCG/ACT SUSP (MOMETASONE FUROATE) 2 pufs each nostril qday ALLEGRA-D ALLERGY & CONGESTION 180-240 MG XR24H-TAB (FEXOFENADINE-PSEUDOEPHEDRINE) 1 by mouth q day  REVIEW OF SYSTEMS Constitutional Symptoms      Denies fever, chills, night sweats, weight loss, weight gain, and fatigue.  Eyes       Denies change in vision, eye pain, eye discharge, glasses, contact lenses, and eye surgery. Ear/Nose/Throat/Mouth       Complains of ear pain and sinus problems.      Denies hearing loss/aids, change in hearing, ear discharge, dizziness, frequent runny nose, frequent nose bleeds, sore throat, hoarseness, and tooth pain or bleeding.  Respiratory       Denies dry cough, productive cough,  wheezing, shortness of breath, asthma, bronchitis, and emphysema/COPD.  Cardiovascular       Denies murmurs, chest pain, and tires easily with exhertion.     Gastrointestinal       Denies stomach pain, nausea/vomiting, diarrhea, constipation, blood in bowel movements, and indigestion. Genitourniary       Denies painful urination, kidney stones, and loss of urinary control. Neurological       Denies paralysis, seizures, and fainting/blackouts. Musculoskeletal       Denies muscle pain, joint pain, joint stiffness, decreased range of motion, redness, swelling, muscle weakness, and gout.  Skin       Denies bruising, unusual mles/lumps or sores, and hair/skin or nail changes.  Psych       Denies mood changes, temper/anger issues, anxiety/stress, speech problems, depression, and sleep problems. 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  Has a grown child - daughter in [place] Never smoked. occas ETOH. Walks for exercise.  Past Medical History: Reviewed history from 10/11/2010 and no changes required. HTN postmenopausal bleeding Fibromyalgia, [name] gyn Dr Christella Hartigan  Past Surgical History: Reviewed history from 02/10/2010 and no changes required. R knee replacement 2000, Dr Shelle Iron breast biopsy 1980,neg  Family History: Reviewed history from 02/10/2010 and no changes required. Mother, alive, DM Father died, war wounds, HTN, DM 2 brothers healthy  Social History: Reviewed history from 02/10/2010 and no changes required. 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  Has a grown child - daughter in [place] Never smoked. occas ETOH. Walks for exercise. Physical Exam General appearance: well developed, well nourished, no acute distress Head: normocephalic, atraumatic Ears: normal, no lesions or deformities some mild teenderness over the r eustacian tube area. Nasal:  pale, boggy, swollen nasal turbinates Oral/Pharynx: tongue normal, posterior pharynx without erythema or exudate Neck: neck supple,  trachea midline, no masses Skin: no obvious rashes or lesions MSE: oriented to time, place, and person Assessment Problems:   CONTUSION OF UPPER ARM (ICD-923.03) BRONCHITIS, ACUTE (ICD-466.0) SCREENING FOR DIABETES MELLITUS (ICD-V77.1) SCREENING FOR LIPOID DISORDERS (ICD-V77.91) ABDOMINAL PAIN, GENERALIZED (ICD-789.07) FIBROMYALGIA (ICD-729.1) VITAMIN D DEFICIENCY (ICD-268.9) BACK PAIN (ICD-724.5) ANXIETY (ICD-300.00) ACUTE SINUSITIS, UNSPECIFIED (ICD-461.9) INFECTIOUS COLITIS ENTERITIS AND GASTROENTERITIS (ICD-009.0) LEUKOCYTOSIS (ICD-288.60) STRESS INCONTINENCE (ICD-788.39) OSTEOARTHRITIS, MILD (ICD-715.90) INSOMNIA (ICD-780.52) ESOPHAGEAL REFLUX (ICD-530.81) ESSENTIAL HYPERTENSION, BENIGN (ICD-401.1) POSTMENOPAUSAL BLEEDING (ICD-627.1) UNSPECIFIED VITAMIN D DEFICIENCY (ICD-268.9) New Problems: EUSTACHIAN TUBE DYSFUNCTION, RIGHT (ICD-381.81)  eustacian tube dysfunction  Patient Education: Patient and/or caregiver instructed in the following: rest fluids and Tylenol.  Plan New Medications/Changes: ALLEGRA-D ALLERGY & CONGESTION 180-240 MG XR24H-TAB (FEXOFENADINE-PSEUDOEPHEDRINE) 1 by mouth q day  #30 x 0, 06/03/2011, Hassan Rowan MD NASONEX 50 MCG/ACT SUSP (MOMETASONE FUROATE) 2 pufs each nostril qday  #1 x 0, 06/03/2011, Hassan Rowan MD AUGMENTIN [phone] MG TABS (AMOXICILLIN-POT CLAVULANATE) 1 by mouth 2 x a day  #20 x 0, 06/03/2011, Hassan Rowan MD  New Orders: Est. Patient Level III [04540] Follow Up: Follow up in 2-3 days if no improvement, Follow up on an as needed basis, Follow up with Primary Physician  The patient and/or caregiver has been counseled thoroughly with regard to medications prescribed including dosage, schedule, interactions, rationale for use, and possible side effects and they verbalize understanding.  Diagnoses and  expected course of recovery discussed and will  return if not improved as expected or if the condition worsens. Patient and/or caregiver verbalized understanding.  Prescriptions: ALLEGRA-D ALLERGY & CONGESTION 180-240 MG XR24H-TAB (FEXOFENADINE-PSEUDOEPHEDRINE) 1 by mouth q day  #30 x 0   Entered and Authorized by:   Hassan Rowan MD   Signed by:   Hassan Rowan MD on 06/03/2011   Method used:   Printed then faxed to ...       [facility] [address]* (retail)       [address]       [place], [place]  [address]       Ph: [phone]       Fax: [phone]   RxID:   [id number] NASONEX 50 MCG/ACT SUSP (MOMETASONE FUROATE) 2 pufs each nostril qday  #1 x 0   Entered and Authorized by:   Hassan Rowan MD   Signed by:   Hassan Rowan MD on 06/03/2011   Method used:   Printed then faxed to ...       [facility] [address]* (retail)       [address]       [place], [place]  [address]       Ph: [phone]       Fax: [phone]   RxID:   [id number] AUGMENTIN 875-125 MG TABS (AMOXICILLIN-POT CLAVULANATE) 1 by mouth 2 x a day  #20 x 0   Entered and Authorized by:   Hassan Rowan MD   Signed by:   Hassan Rowan MD on 06/03/2011   Method used:   Printed then faxed to ...       [facility] [address] (retail)       [address]       [place], [place]  [address]       Ph: [phone]       Fax: [phone]   RxID:   [id number]   Patient Instructions: 1)  Please schedule a follow-up appointment in 2 weeks. 2)  Please schedule an appointment with your primary doctor in :3-5 days if not better 3)  Take your antibiotic as prescribed until ALL of it is gone, but stop if you develop a rash or swelling and contact our office as soon as possible. 4)  OftenaAcute sinusitis symptoms for less than 10 days are not helped by antibiotics.Use warm moist compresses, and over the counter decongestants ( only as directed). Call if no improvement in 5-7 days, sooner if increasing pain, fever, or new  symptoms. The nasal spray and decongestants will help if this is viral only. 5)  Recommended remaining out of work for 1-2 days if neded.  Orders Added: 1)  Est. Patient Level III [51761]

## 2011-12-03 NOTE — Progress Notes (Signed)
Summary: ALLERGIES? vs URI (room 2)   Vital Signs:  Patient Profile:   60 Years Old Female CC:      CONGESTION, COUGH , FATIIGUE Height:     64.25 inches Weight:      223 pounds O2 Sat:      99 % O2 treatment:    Room Air Pulse rate:   86 / minute Resp:     18 per minute BP sitting:   127 / 88  (left arm) Cuff size:   large  Vitals Entered By: Lavell Islam RN (October 14, 2011 1:54 PM)             Comments recent allergy work-up      Current Allergies: No known allergies History of Present Illness Chief Complaint: CONGESTION, COUGH , FATIIGUE History of Present Illness:  Subjective: Patient complains of onset of increased sneezing and sinus congestion about 3 weeks ago.  Two weeks ago she underwent allergy testing.  She is presently using Nasonex, and albuterol inhaler.  Two days ago she developed a non-productive cough. No sore throat No pleuritic pain, but has tightness in her anterior chest ? wheezing + post-nasal drainage ? sinus pain/pressure No itchy/red eyes No earache, but ears feel clogged No hemoptysis No SOB No fever, + chills No nausea No vomiting No abdominal pain No diarrhea No skin rashes + fatigue + myalgias No headache    REVIEW OF SYSTEMS Constitutional Symptoms       Complains of fever, chills, and fatigue.     Denies night sweats, weight loss, and weight gain.  Eyes       Denies change in vision, eye pain, eye discharge, glasses, contact lenses, and eye surgery. Ear/Nose/Throat/Mouth       Complains of frequent runny nose, sinus problems, and hoarseness.      Denies hearing loss/aids, change in hearing, ear pain, ear discharge, dizziness, frequent nose bleeds, sore throat, and tooth pain or bleeding.  Respiratory       Complains of dry cough, wheezing, shortness of breath, and asthma.      Denies productive cough, bronchitis, and emphysema/COPD.      Comments: chest wall pain with cough Cardiovascular       Denies murmurs, chest  pain, and tires easily with exhertion.    Gastrointestinal       Denies stomach pain, nausea/vomiting, diarrhea, constipation, blood in bowel movements, and indigestion. Genitourniary       Denies painful urination, kidney stones, and loss of urinary control. Neurological       Denies paralysis, seizures, and fainting/blackouts. Musculoskeletal       Denies muscle pain, joint pain, joint stiffness, decreased range of motion, redness, swelling, muscle weakness, and gout.  Skin       Denies bruising, unusual mles/lumps or sores, and hair/skin or nail changes.  Psych       Denies mood changes, temper/anger issues, anxiety/stress, speech problems, depression, and sleep problems. Other Comments: respiratory problems   Past History:  Past Medical History: HTN postmenopausal bleeding Fibromyalgia, [name] gyn Dr Christella Hartigan Allergic rhinitis Asthma  Past Surgical History: Reviewed history from 02/10/2010 and no changes required. R knee replacement 2000, Dr Shelle Iron breast biopsy 1980,neg  Family History: Reviewed history from 02/10/2010 and no changes required. Mother, alive, DM Father died, war wounds, HTN, DM 2 brothers healthy  Social History: Reviewed history from 02/10/2010 and no changes required. 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  Has a grown child -  daughter in [place] Never smoked. occas ETOH. Walks for exercise.   Objective:  Appearance:  Patient appears healthy, stated age, and in no acute distress  Eyes:  Pupils are equal, round, and reactive to light and accomodation.  Extraocular movement is intact.  Conjunctivae are not inflamed.  Ears:  Canals normal.  Tympanic membranes normal.   Nose:  Mildly congested turbinates.  No sinus tenderness  Pharynx:  Normal  Neck:  Supple.  Slightly tender shotty posterior nodes are palpated bilaterally.  Lungs:  Faint scattered wheezes.  Breath sounds are equal.  Heart:  Regular rate and rhythm  without murmurs, rubs, or gallops.  Abdomen:  Nontender without masses or hepatosplenomegaly.  Bowel sounds are present.  No CVA or flank tenderness.  Extremities:  No edema.   Skin:  No rash Assessment  Assessed ASTHMA as deteriorated - Donna Christen MD Assessed ALLERGIC RHINITIS as deteriorated - Donna Christen MD Assessed EUSTACHIAN TUBE DYSFUNCTION, RIGHT as deteriorated - Donna Christen MD New Problems: UPPER RESPIRATORY INFECTION, ACUTE (ICD-465.9) ASTHMA (ICD-493.90) ALLERGIC RHINITIS (ICD-477.9)  NO EVIDENCE BACTERIAL INFECTION TODAY  Plan New Medications/Changes: AMOXICILLIN 875 MG TABS (AMOXICILLIN) One by mouth two times a day (Rx void after 10/22/11)  #20 x 0, 10/14/2011, Donna Christen MD BENZONATATE 200 MG CAPS (BENZONATATE) One by mouth hs as needed cough  #12 x 0, 10/14/2011, Donna Christen MD PREDNISONE 10 MG TABS (PREDNISONE) 2 PO BID for 2 days, then 1 BID for 2 days, then 1 daily for 2 days.  Take PC  #14 x 0, 10/14/2011, Donna Christen MD  New Orders: Pulse Oximetry (single measurment) [94760] Est. Patient Level IV [91478] Services provided After hours-Weekends-Holidays [99051] Planning Comments:   Begin tapering course of prednisone. Treat symptomatically for now:  Increase fluid intake, begin expectorant/decongestant, topical decongestant,  cough suppressant at bedtime. Continue albuterol inhaler and Nasonex.  If fever/chills/sweats persist, or if not improving 5  days begin amoxicillin (given Rx to hold).  Followup with PCP if not improving 7 to 10 days.   The patient and/or caregiver has been counseled thoroughly with regard to medications prescribed including dosage, schedule, interactions, rationale for use, and possible side effects and they verbalize understanding.  Diagnoses and expected course of recovery discussed and will return if not improved as expected or if the condition worsens. Patient and/or caregiver verbalized understanding.   Prescriptions: AMOXICILLIN 875 MG TABS (AMOXICILLIN) One by mouth two times a day (Rx void after 10/22/11)  #20 x 0   Entered and Authorized by:   Donna Christen MD   Signed by:   Donna Christen MD on 10/14/2011   Method used:   Print then Give to Patient   RxID:   [id number] BENZONATATE 200 MG CAPS (BENZONATATE) One by mouth hs as needed cough  #12 x 0   Entered and Authorized by:   Donna Christen MD   Signed by:   Donna Christen MD on 10/14/2011   Method used:   Print then Give to Patient   RxID:   [id number] PREDNISONE 10 MG TABS (PREDNISONE) 2 PO BID for 2 days, then 1 BID for 2 days, then 1 daily for 2 days.  Take PC  #14 x 0   Entered and Authorized by:   Donna Christen MD   Signed by:   Donna Christen MD on 10/14/2011   Method used:   Print then Give to Patient   RxID:   [id number]   Patient Instructions: 1)  Take Mucinex  (guaifenesin) twice  daily for congestion. 2)  Increase fluid intake, rest. 3)  May use Afrin nasal spray (or generic oxymetazoline) twice daily for about 5 days.  Also recommend using saline nasal spray several times daily and/or saline nasal irrigation.  Use your Nasonex after using Afrin and saline irrigation 4)  Continue albuterol inhaler 5)  Begin Amoxicillin if not improving about 5 days or if persistent fever develops. 6)  Followup with family doctor if not improving 7 to 10 days.   Orders Added: 1)  Pulse Oximetry (single measurment) [94760] 2)  Est. Patient Level IV [[address]] 3)  Services provided After hours-Weekends-Holidays [99051]

## 2011-12-03 NOTE — Telephone Encounter (Signed)
  Phone Note Outgoing Call Call back at [facility] Phone [phone]   Call placed by: Lajean Saver RN,  October 22, 2011 1:27 PM Call placed to: Patient Summary of Call: Callback: No answer. Message left to call back with questions or concerns

## 2011-12-03 NOTE — Letter (Signed)
Summary: Out of Work  [facility]  [address]   [place], [place] [address]   Phone: [phone]  Fax: [phone]    June 03, 2011   Employee:  DIANELYS SCINTO [employer]    To Whom It May Concern:   For Medical reasons, please excuse the above named employee from work for the following dates:  Start:   06/03/2011  Return:   06/05/2011  If you need additional information, please feel free to contact our office.         Sincerely,    Hassan Rowan MD

## 2011-12-10 ENCOUNTER — Emergency Department
Admission: EM | Admit: 2011-12-10 | Discharge: 2011-12-10 | Disposition: A | Discharge status: Home / Self Care | Payer: BC Managed Care – PPO | Source: Home / Self Care | Attending: Emergency Medicine | Admitting: Emergency Medicine

## 2011-12-10 ENCOUNTER — Emergency Department
Admit: 2011-12-10 | Discharge: 2011-12-10 | Disposition: A | Discharge status: Home / Self Care | Payer: BC Managed Care – PPO | Attending: Emergency Medicine | Admitting: Emergency Medicine

## 2011-12-10 ENCOUNTER — Encounter: Payer: Self-pay | Admitting: *Deleted

## 2011-12-10 DIAGNOSIS — J209 Acute bronchitis, unspecified: Secondary | ICD-10-CM

## 2011-12-10 DIAGNOSIS — R059 Cough, unspecified: Secondary | ICD-10-CM

## 2011-12-10 DIAGNOSIS — R05 Cough: Secondary | ICD-10-CM

## 2011-12-10 MED ORDER — LEVOFLOXACIN 500 MG PO TABS: 500.0000 mg | ORAL_TABLET | Freq: Every day | ORAL | Status: AC

## 2011-12-10 MED ORDER — GUAIFENESIN-CODEINE 100-10 MG/5ML PO SYRP: 5.0000 mL | ORAL_SOLUTION | Freq: Four times a day (QID) | ORAL | Status: AC | PRN

## 2011-12-10 NOTE — ED Notes (Signed)
Patient c/o cough, chest pain with cough, "scratchy" throat,  And sweats x 2 days.

## 2011-12-10 NOTE — ED Provider Notes (Signed)
History     CSN: [id number] Arrival date & time: 12/10/2011 10:42 AM   First MD Initiated Contact with Patient 12/10/11 1042      Chief Complaint  Patient presents with  . Cough    (Consider location/radiation/quality/duration/timing/severity/associated sxs/prior treatment) HPI Sara [name] is a 60 y.o. female who complains of onset of cold symptoms for 2 days. However she has been having intermittent symptoms over the last 2 weeks. She has been seen by her allergist who has placed her on 2 Z-Paks as well as prednisone in the last few weeks. No sore throat ++ cough + pleuritic pain + wheezing () she has been using her inhaler which is helping. + nasal congestion + post-nasal drainage +sinus pain/pressure + chest congestion No itchy/red eyes No earache No hemoptysis No SOB No chills/sweats No fever No nausea No vomiting No abdominal pain No diarrhea No skin rashes + fatigue No myalgias No headache    Past Medical History  Diagnosis Date  . Hypertension   . Postmenopausal bleeding   . Fibromyalgia     [name]  . Asthma     Past Surgical History  Procedure Date  . Total knee arthroplasty 2000    Right, Dr. Shelle Iron  . Breast biopsy 1980    neg    Family History  Problem Relation Age of Onset  . Diabetes Mother   . Hypertension Father   . Diabetes Father   . Heart attack Mother     First MI at age 17    History  Substance Use Topics  . Smoking status: Never Smoker   . Smokeless tobacco: Never Used  . Alcohol Use: Yes     Occasional    OB History    Grav Para Term Preterm Abortions TAB SAB Ect Mult Living                  Review of Systems  Allergies  Review of patient's allergies indicates no known allergies.  Home Medications   Current Outpatient Rx  Name Route Sig Dispense Refill  . PROVENTIL HFA IN Inhalation Inhale into the lungs.      . ASPIRIN 81 MG PO TABS Oral Take 81 mg by mouth daily.      Marland Kitchen CLONAZEPAM 1 MG PO TABS Oral Take 1  tablet (1 mg total) by mouth 2 (two) times daily as needed for anxiety. May tak !/2 a tablet twice a day to a whole tablet twice a day as needed 30 tablet 3  . COMBIPATCH 0.05-0.14 MG/DAY TD PTTW      . GABAPENTIN 300 MG PO CAPS Oral Take 300 mg by mouth 3 (three) times daily.      . GUAIFENESIN-CODEINE 100-10 MG/5ML PO SYRP Oral Take 5 mLs by mouth 4 (four) times daily as needed for cough or congestion. 120 mL 0  . LEVOFLOXACIN 500 MG PO TABS Oral Take 1 tablet (500 mg total) by mouth daily. 7 tablet 0  . LISINOPRIL-HYDROCHLOROTHIAZIDE 10-12.5 MG PO TABS Oral Take 1 tablet by mouth daily. 30 tablet 6  . MELOXICAM 15 MG PO TABS Oral Take 1 tablet (15 mg total) by mouth daily as needed for pain. 30 tablet 4  . METOPROLOL TARTRATE 50 MG PO TABS Oral Take 1 tablet (50 mg total) by mouth 2 (two) times daily. 60 tablet 11  . MONTELUKAST SODIUM 10 MG PO TABS Oral Take 10 mg by mouth at bedtime.      Marland Kitchen NASONEX 50 MCG/ACT NA SUSP  as needed.     Marland Kitchen OMEPRAZOLE 40 MG PO CPDR  TAKE ONE CAPSULE BY MOUTH DAILY 30 capsule 2  . POTASSIUM CHLORIDE CRYS CR 20 MEQ PO TBCR Oral Take 1 tablet (20 mEq total) by mouth daily. OR ONLY WHEN TAKING DEMADEX OR FUROSEMIDE 30 tablet 12  . TORSEMIDE 20 MG PO TABS Oral Take 1 tablet (20 mg total) by mouth daily. Or every other day 30 tablet 6  . ZOLPIDEM TARTRATE 10 MG PO TABS Oral Take 1 tablet (10 mg total) by mouth at bedtime as needed for sleep. 30 tablet 3    BP 130/85  Pulse 106  Temp(Src) 99.8 F (37.7 C) (Oral)  Resp 16  Ht 5\' 4"  (1.626 m)  Wt 226 lb (102.513 kg)  BMI 38.79 kg/m2  SpO2 98%  Physical Exam  Nursing note and vitals reviewed. Constitutional: She is oriented to person, place, and time. She appears well-developed and well-nourished.  HENT:  Head: Normocephalic and atraumatic.  Right Ear: Tympanic membrane, external ear and ear canal normal.  Left Ear: Tympanic membrane, external ear and ear canal normal.  Nose: Mucosal edema and rhinorrhea  present.  Mouth/Throat: Posterior oropharyngeal erythema present. No oropharyngeal exudate or posterior oropharyngeal edema.  Neck: Neck supple.  Cardiovascular: Regular rhythm and normal heart sounds.   Pulmonary/Chest: Effort normal and breath sounds normal. No respiratory distress.  Neurological: She is alert and oriented to person, place, and time.  Skin: Skin is warm and dry.  Psychiatric: She has a normal mood and affect. Her speech is normal.    ED Course  Procedures (including critical care time)  Labs Reviewed - No data to display Dg Chest 2 View  12/10/2011  *RADIOLOGY REPORT*  Clinical Data: Fever, cough  CHEST - 2 VIEW  Comparison: None.  Findings: The lungs are clear.  Mediastinal contours appear normal. The heart is within normal limits in size.  No bony abnormality is seen.  IMPRESSION: No active lung disease.  Original Report Authenticated By: Juline Patch, M.D.     1. Acute bronchitis   2. Cough       MDM  Due to her symptoms of cough, fever, and a feeling of chest tightness, we've ordered a chest x-ray which is read by radiology as above. 1)  Take the prescribed antibiotic as instructed. 2)  Use nasal saline solution (over the counter) at least 3 times a day. 3)  Use over the counter decongestants like Zyrtec-D every 12 hours as needed to help with congestion.  If you have hypertension, do not take medicines with sudafed.  4)  Can take tylenol every 6 hours or motrin every 8 hours for pain or fever. 5)  Follow up with your primary doctor if no improvement in 5-7 days, sooner if increasing pain, fever, or new symptoms.     Lily Kocher, MD 12/10/11 [phone]

## 2012-02-13 ENCOUNTER — Other Ambulatory Visit: Payer: Self-pay | Admitting: Family Medicine

## 2012-02-13 DIAGNOSIS — I10 Essential (primary) hypertension: Secondary | ICD-10-CM

## 2012-02-13 MED ORDER — LISINOPRIL-HYDROCHLOROTHIAZIDE 10-12.5 MG PO TABS: 1.0000 | ORAL_TABLET | Freq: Every day | ORAL | Status: DC

## 2012-02-15 ENCOUNTER — Other Ambulatory Visit: Payer: Self-pay | Admitting: *Deleted

## 2012-02-15 DIAGNOSIS — F430 Acute stress reaction: Secondary | ICD-10-CM

## 2012-02-15 DIAGNOSIS — F419 Anxiety disorder, unspecified: Secondary | ICD-10-CM

## 2012-02-15 MED ORDER — CLONAZEPAM 1 MG PO TABS: 1.0000 mg | ORAL_TABLET | Freq: Two times a day (BID) | ORAL | Status: DC | PRN

## 2012-03-04 ENCOUNTER — Ambulatory Visit (INDEPENDENT_AMBULATORY_CARE_PROVIDER_SITE_OTHER): Payer: BC Managed Care – PPO | Admitting: Family Medicine

## 2012-03-04 ENCOUNTER — Encounter: Payer: Self-pay | Admitting: Family Medicine

## 2012-03-04 VITALS — BP 105/67 | HR 69 | Ht 64.0 in | Wt 223.0 lb

## 2012-03-04 DIAGNOSIS — IMO0001 Reserved for inherently not codable concepts without codable children: Secondary | ICD-10-CM

## 2012-03-04 DIAGNOSIS — Z23 Encounter for immunization: Secondary | ICD-10-CM

## 2012-03-04 DIAGNOSIS — F419 Anxiety disorder, unspecified: Secondary | ICD-10-CM

## 2012-03-04 DIAGNOSIS — I10 Essential (primary) hypertension: Secondary | ICD-10-CM

## 2012-03-04 DIAGNOSIS — G4720 Circadian rhythm sleep disorder, unspecified type: Secondary | ICD-10-CM

## 2012-03-04 DIAGNOSIS — K219 Gastro-esophageal reflux disease without esophagitis: Secondary | ICD-10-CM

## 2012-03-04 DIAGNOSIS — D1779 Benign lipomatous neoplasm of other sites: Secondary | ICD-10-CM

## 2012-03-04 DIAGNOSIS — J45998 Other asthma: Secondary | ICD-10-CM

## 2012-03-04 DIAGNOSIS — F411 Generalized anxiety disorder: Secondary | ICD-10-CM

## 2012-03-04 DIAGNOSIS — D171 Benign lipomatous neoplasm of skin and subcutaneous tissue of trunk: Secondary | ICD-10-CM

## 2012-03-04 DIAGNOSIS — G4700 Insomnia, unspecified: Secondary | ICD-10-CM

## 2012-03-04 DIAGNOSIS — J45909 Unspecified asthma, uncomplicated: Secondary | ICD-10-CM

## 2012-03-04 DIAGNOSIS — M797 Fibromyalgia: Secondary | ICD-10-CM

## 2012-03-04 DIAGNOSIS — F430 Acute stress reaction: Secondary | ICD-10-CM

## 2012-03-04 MED ORDER — OMEPRAZOLE 40 MG PO CPDR: 40.0000 mg | DELAYED_RELEASE_CAPSULE | Freq: Every day | ORAL | Status: DC

## 2012-03-04 MED ORDER — ZOLPIDEM TARTRATE 10 MG PO TABS: 10.0000 mg | ORAL_TABLET | Freq: Every evening | ORAL | Status: DC | PRN

## 2012-03-04 MED ORDER — GABAPENTIN 400 MG PO CAPS: 400.0000 mg | ORAL_CAPSULE | Freq: Three times a day (TID) | ORAL | Status: DC

## 2012-03-04 MED ORDER — CLONAZEPAM 1 MG PO TABS: 1.0000 mg | ORAL_TABLET | Freq: Two times a day (BID) | ORAL | Status: DC | PRN

## 2012-03-04 NOTE — Patient Instructions (Signed)
Lipoma A lipoma is a noncancerous (benign) tumor composed of fat cells. They are usually found under the skin (subcutaneous). A lipoma may occur in any tissue of the body that contains fat. Common areas for lipomas to appear include the back, shoulders, buttocks, and thighs. Lipomas are a very common soft tissue growth. They are soft and grow slowly. Most problems caused by a lipoma depend on where it is growing. DIAGNOSIS  A lipoma can be diagnosed with a physical exam. These tumors rarely become cancerous, but radiographic studies can help determine this for certain. Studies used may include:  Computerized X-ray scans (CT or CAT scan).   Computerized magnetic scans (MRI).  TREATMENT  Small lipomas that are not causing problems may be watched. If a lipoma continues to enlarge or causes problems, removal is often the best treatment. Lipomas can also be removed to improve appearance. Surgery is done to remove the fatty cells and the surrounding capsule. Most often, this is done with medicine that numbs the area (local anesthetic). The removed tissue is examined under a microscope to make sure it is not cancerous. Keep all follow-up appointments with your caregiver. SEEK MEDICAL CARE IF:   The lipoma becomes larger or hard.   The lipoma becomes painful, red, or increasingly swollen. These could be signs of infection or a more serious condition.  Document Released: 12/07/2002 Document Revised: 12/06/2011 Document Reviewed: 05/19/2010 Exit[employer] Patient Information 2012 [employer]. 

## 2012-03-05 ENCOUNTER — Encounter: Payer: Self-pay | Admitting: Family Medicine

## 2012-03-05 NOTE — Progress Notes (Signed)
  Subjective:    Patient ID: Sara [name], female    DOB: September 13, 1951, 61 y.o.   MRN: [id number]  HPI #1 asthma. Asthma is under control at this time #2 situations disturbance/ anxiety. Patient is on Klonopin 1 mg twice a day which seems to control her symptoms. She has been getting counseling from a family member who does psychiatric counseling. It should be noted the patient states that she has reconciled her marriage at this time as a convenience status. Her plan is to live in the same house as her husband but she states without contrast is not going to be much salvaged. She is reassured me that she is handling this now. 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 Immunizations needs to be updated. She needs a pneumonia vaccination she does have asthma and she'll need tetanus update.  #4 hypertension #5 left shoulder lesion #6 insomnia. Her insurance company wants to make sure she needs the Ambien at night. Turns out the patient reports being on the Ambien for about 15 years. Sleep studies done in the past were negative. She was wants to remain on the Ambien.   Review of Systems    BP 105/67  Pulse 69  Ht 5\' 4"  (1.626 m)  Wt 223 lb (101.152 kg)  BMI 38.28 kg/m2  SpO2 97% Objective:   Physical Exam  Constitutional: She is oriented to [name], place, and time. She appears well-developed and well-nourished.  HENT:  Head: Normocephalic and atraumatic.  Eyes: Pupils are equal, round, and reactive to light.  Neck: Normal range of motion. Neck supple. No thyromegaly present.  Cardiovascular: Normal rate, regular rhythm and normal heart sounds.   No murmur heard. Pulmonary/Chest: Effort normal and breath sounds normal. No respiratory distress. She has no wheezes.  Musculoskeletal: Normal range of motion.       Lesion on left shoulder appears to be a lipoma. It is not tender to touch inflamed or irritated at this time.  Neurological: She is alert and oriented to [name],  place, and time. No cranial nerve deficit. Coordination normal.  Skin: Skin is warm.       Multiple moles present on the back she was assured me previous dermatologist review is negative and his multiple moles have not changed.  Psychiatric: Her mood appears not anxious. Her affect is not angry and not inappropriate. Her speech is not rapid and/or pressured. Cognition and memory are not impaired. She does not express impulsivity. She does not exhibit a depressed mood. She exhibits normal recent memory.      Assessment & Plan:  #1 asthma. Under control no new changes at this time #2 situation disturbance/anxiety. Patient is doing quite well holding her own. Encourage her to continue with counseling and use the Klonopin. #3 hypertension. Under good control continue current blood pressure medicine #4 in addition update she needs both tetanus and Pneumovax In both will be give today. #5 lipoma of the left shoulder this time no new changes. Would recommend no actions let's change #6 multiple skin lesions. This time would not recommend any biopsies or actions unless there are changes #7 insomnia. Patient clearly needs her Ambien we'll try to continue her on that medication. #8 she needs refill for reflux medication and will give her that as well.

## 2012-04-23 ENCOUNTER — Other Ambulatory Visit: Payer: Self-pay | Admitting: Family Medicine

## 2012-04-24 ENCOUNTER — Other Ambulatory Visit: Payer: Self-pay | Admitting: *Deleted

## 2012-04-24 DIAGNOSIS — I10 Essential (primary) hypertension: Secondary | ICD-10-CM

## 2012-04-24 MED ORDER — LISINOPRIL-HYDROCHLOROTHIAZIDE 10-12.5 MG PO TABS: 1.0000 | ORAL_TABLET | Freq: Every day | ORAL | Status: DC

## 2012-04-28 ENCOUNTER — Other Ambulatory Visit: Payer: Self-pay | Admitting: *Deleted

## 2012-04-28 MED ORDER — MELOXICAM 15 MG PO TABS: 15.0000 mg | ORAL_TABLET | Freq: Every day | ORAL | Status: DC | PRN

## 2012-05-19 ENCOUNTER — Other Ambulatory Visit: Payer: Self-pay | Admitting: *Deleted

## 2012-05-19 MED ORDER — TORSEMIDE 20 MG PO TABS: 20.0000 mg | ORAL_TABLET | Freq: Every day | ORAL | Status: DC

## 2012-05-27 ENCOUNTER — Encounter: Payer: Self-pay | Admitting: Family Medicine

## 2012-07-07 ENCOUNTER — Telehealth: Payer: Self-pay | Admitting: *Deleted

## 2012-07-07 DIAGNOSIS — F329 Major depressive disorder, single episode, unspecified: Secondary | ICD-10-CM

## 2012-07-07 NOTE — Telephone Encounter (Signed)
Pt is asking if you will sending a referral for counseling. Pt states that she has been having some marital problems and can't control the crying or anxiety. Please advise.

## 2012-07-08 NOTE — Telephone Encounter (Signed)
We have addressed this issue on previous notes. You may go ahead with referral. Please find out where she wants to have the counseling done, and if there is anybody in particular she wants to have the counseling with.

## 2012-07-08 NOTE — Telephone Encounter (Signed)
Patient would like the referral to go downstairs at [facility]

## 2012-07-08 NOTE — Telephone Encounter (Signed)
LM for pt to returncall

## 2012-07-15 ENCOUNTER — Ambulatory Visit (INDEPENDENT_AMBULATORY_CARE_PROVIDER_SITE_OTHER): Payer: BC Managed Care – PPO | Admitting: Psychiatry

## 2012-07-15 ENCOUNTER — Encounter (HOSPITAL_COMMUNITY): Payer: Self-pay | Admitting: Psychiatry

## 2012-07-15 VITALS — BP 112/71 | HR 61 | Ht 64.0 in | Wt 230.0 lb

## 2012-07-15 DIAGNOSIS — F418 Other specified anxiety disorders: Secondary | ICD-10-CM | POA: Insufficient documentation

## 2012-07-15 DIAGNOSIS — F329 Major depressive disorder, single episode, unspecified: Secondary | ICD-10-CM

## 2012-07-15 DIAGNOSIS — F332 Major depressive disorder, recurrent severe without psychotic features: Secondary | ICD-10-CM

## 2012-07-15 MED ORDER — SERTRALINE HCL 25 MG PO TABS
ORAL_TABLET | ORAL | Status: DC
Start: 2012-07-15 — End: 2012-08-05

## 2012-07-15 MED ORDER — FLUOXETINE HCL 10 MG PO CAPS: ORAL_CAPSULE | ORAL | Status: DC

## 2012-07-15 NOTE — Progress Notes (Signed)
Psychiatric Assessment Adult  Patient Identification:  Sara [name] Date of Evaluation:  07/15/2012  Chief Complaint:  Chief Complaint  Patient presents with  . Anxiety  . Depression   HPI Comments: History of Chief Complaint:    The patient is a 61 year old woman with symptoms of anxiety and depression.  She report her main stressors have been her anxiety, her relationships, trust issues, grief issues, and possibly moving from her home.  She states her cousinn who was her ebst friend, died a year ago.  The patient reports that two other family members shortly after.  Thereafter, her husband has put the house up for sale without her permission, two weeks ago. The patient reports that her husband asked her to sign papers to list their house but she refused.  She states that her husband has been emotionally distant for the past 6 months. She states that she has had several stressor from her own family including her mother, and her 69 year brother.  In the area of affective symptoms, patient appears depressed. Patient denies current suicidal ideation, intent, or plan. Patient denies current homicidal ideation, intent, or plan. Patient denies auditory hallucinations. Patient denies visual hallucinations. Patient endorses symptoms of paranoia.  Appetite is increased over the past 6-7 months. Energy level is low. Patient denies symptoms of anhedonia. Patient endorses/denies hopelessness, helplessness, or guilt.   Denies any recent episodes consistent with mania, particularly decreased need for sleep with increased energy, grandiosity, impulsivity, hyperverbal and pressured speech, or increased productivity. Denies anyrecent symptoms consistent with psychosis, particularly auditory or visual hallucinations, thought broadcasting/insertion/withdrawal, or ideas of reference.   Anxiety Presents for initial visit. Onset was 6 to 12 months ago. The problem has been gradually worsening. Symptoms include  depressed mood, excessive worry, insomnia and panic ( She has had 3 panic attacks in the past month.). Symptoms occur most days. The most recent episode lasted 20 minutes. The severity of symptoms is incapacitating. The symptoms are aggravated by family issues. The patient sleeps 5 hours per night. The quality of sleep is non-restorative. Nighttime awakenings: several.   Risk factors include a major life event and marital problems. Her past medical history is significant for anxiety/panic attacks and depression. Past treatments include benzodiazephines, non-benzodiazephine anxiolytics and SSRIs. The treatment provided mild relief. Compliance with prior treatments has been good.   The patient has asked that I contact a family friend that she has spoken to in the past [name], M.D, for collateral information, she said she would provide the office with Dr. [name] number.  Review of Systems  Constitutional: Positive for appetite change. Negative for fever, chills, diaphoresis, activity change, fatigue and unexpected weight change.  Respiratory: Negative.   Cardiovascular: Negative.   Gastrointestinal: Negative.   Psychiatric/Behavioral: The patient has insomnia.    Filed Vitals:   07/15/12 0902  BP: 112/71  Pulse: 61  Height: 5\' 4"  (1.626 m)  Weight: 230 lb (104.327 kg)    Physical Exam  Vitals reviewed. Constitutional: She appears well-developed and well-nourished. No distress.  Skin: She is not diaphoretic.     Past Psychiatric History: Diagnosis: None.  Hospitalizations: Patient suicide.  Outpatient Care: Yes, [name]  Substance Abuse Care: Patient suicide.  Self-Mutilation: Patient suicide.  Suicidal Attempts: Patient suicide.  Violent Behaviors: Patient suicide.   Past Medical History:   Past Medical History  Diagnosis Date  . Hypertension   . Postmenopausal bleeding   . Fibromyalgia     [name]  .  Asthma    History of Loss of Consciousness:   Yes-Fainted when her hunband was in the hospital 3 years. Seizure History:  No Cardiac History:  No  Allergies:  No Known Allergies  Current Medications:  Current Outpatient Prescriptions  Medication Sig Dispense Refill  . Albuterol Sulfate (PROVENTIL HFA IN) Inhale into the lungs.        Marland Kitchen aspirin 81 MG tablet Take 81 mg by mouth daily.        . clonazePAM (KLONOPIN) 1 MG tablet Take 1 tablet (1 mg total) by mouth 2 (two) times daily as needed for anxiety. May tak !/2 a tablet twice a day to a whole tablet twice a day as needed  60 tablet  5  . COMBIPATCH 0.05-0.14 MG/DAY       . gabapentin (NEURONTIN) 400 MG capsule Take 1 capsule (400 mg total) by mouth 3 (three) times daily.  90 capsule  11  . lisinopril-hydrochlorothiazide (PRINZIDE) 10-12.5 MG per tablet Take 1 tablet by mouth daily.  30 tablet  2  . meloxicam (MOBIC) 15 MG tablet Take 1 tablet (15 mg total) by mouth daily as needed for pain.  30 tablet  3  . metoprolol (LOPRESSOR) 50 MG tablet Take 1 tablet (50 mg total) by mouth 2 (two) times daily.  60 tablet  11  . montelukast (SINGULAIR) 10 MG tablet Take 10 mg by mouth at bedtime.        Marland Kitchen NASONEX 50 MCG/ACT nasal spray as needed.       Marland Kitchen omeprazole (PRILOSEC) 40 MG capsule Take 1 capsule (40 mg total) by mouth daily.  30 capsule  11  . potassium chloride SA (K-DUR,KLOR-CON) 20 MEQ tablet Take 1 tablet (20 mEq total) by mouth daily. OR ONLY WHEN TAKING DEMADEX OR FUROSEMIDE  30 tablet  12  . torsemide (DEMADEX) 20 MG tablet Take 1 tablet (20 mg total) by mouth daily. Or every other day  30 tablet  6  . zolpidem (AMBIEN) 10 MG tablet Take 1 tablet (10 mg total) by mouth at bedtime as needed for sleep.  30 tablet  5   Previous Psychotropic Medications:  Medication:  Cymbalta  Clonazepam  Ambien   SUBSTANCE USE HISTORY:  Caffeine: Coffee one-half cup per day. Caffeinated Beverages xx ounces per day. Nicotine: Patient denies.  Alcohol: One glass 1-2 times per week.  Illicit  Drugs: Patient denies.   MENTAL ILLNESS AND SUBSTANCE ABUSE IN FAMILY MEMBERS:  Psychiatric illness: Patient's mother suffered from depression. Substance abuse: Patient reports her father was an alcoholic. Suicides: Patient denies.  Blackouts: None DT's:  None Withdrawal Symptoms: None  Social History: Current Place of Residence: [place], [place] Place of Birth: [place], [place] Family Members: Husband, 3 children-1 biological and  3 stepchildren, and 2 younger brothers, and one deceased and one living parent-father died at 51. Marital Status:  Married Children: 1 biological; 3 stepchildren  Daughters: 39 Relationships: She reports that her main source of emotional support is her father. Education:  HS Graduate Educational Problems/Performance: Did well Religious Beliefs/Practices: Goes to church. History of Abuse: emotional (from mother) and verbal-by mother Occupational Experiences: retired Runner, broadcasting/film/video. Military History:  None. Legal History: Yes, husband may have pt Hobbies/Interests: Avid reader and listening to music.  Family History:   Family History  Problem Relation Age of Onset  . Diabetes Mother   . Hypertension Father   . Diabetes Father   . Heart attack Mother     First MI at  age 16    Mental Status Examination/Evaluation: Objective:  Appearance: Casual and Well Groomed  Eye Contact::  Good  Speech:  Clear and Coherent and Normal Rate  Volume:  Normal  Mood:  Apprehensive about coming"  Affect:  Appropriate, Congruent and Depressed  Thought Process:  Coherent, Linear and Logical  Orientation:  Other:  alert and oriented to person, place and day and year  Thought Content:  WDL  Suicidal Thoughts:  No  Homicidal Thoughts:  No  Judgement:  Good  Insight:  Good  Psychomotor Activity:  Normal  Akathisia:  No  Handed:  Right  Memory: 3/3 immediate; 2/3 recent.  Assets:  Communication Skills Desire for Improvement Financial  Resources/Insurance Housing Leisure Time Physical Health Social Support Talents/Skills Transportation Vocational/Educational    Laboratory/X-Ray Psychological Evaluation(s)   none  none   Assessment:  AXIS I Major Depression, Recurrent severe  AXIS II No diagnosis  AXIS III Past Medical History  Diagnosis Date  . Hypertension   . Postmenopausal bleeding   . Fibromyalgia     [name]  . Asthma      AXIS IV problems with primary support group  AXIS V GAF: 50   Treatment Plan/Recommendations:  PLAN:  1. Affirm with the patient that the medications are taken as ordered. Patient expressed understanding of how their medications were to be used.  2. Continue the following psychiatric medications as written prior to this appointment with the following changes:  a) Trial of Zoloft 25 mg for 7 days, then increase to 50 mg. b) Continue clonazepam patient-would recommend tapering this medication as sertraline is increased. Advised patient to use this medication only for panic attacks. c) Continue Ambien 10 mg-would not recommend long term use of this medication. 3. Therapy: brief supportive therapy provided. Continue current services.  4. Risks and benefits, side effects and alternatives discussed with patient, he/she was given an opportunity to ask questions about his/her medication, illness, and treatment. All current psychiatric medications have been reviewed and discussed with the patient and adjusted as clinically appropriate. The patient has been provided an accurate and updated list of the medications being now prescribed.  5. Patient told to call clinic if any problems occur. Patient advised to go to ER  if s/he should develop SI/HI, side effects, or if symptoms worsen. Has crisis numbers to call if needed.   6. No labs warranted at this time.  7. The patient was encouraged to keep all PCP and specialty clinic appointments.  8. Patient was instructed to return to clinic in 1  month.  9. The patient was advised to call and cancel their mental health appointment within 24 hours of the appointment, if they are unable to keep the appointment.  10. The patient expressed understanding   Jacqulyn Cane, MD 7/16/20139:02 AM

## 2012-07-17 ENCOUNTER — Telehealth (HOSPITAL_COMMUNITY): Payer: Self-pay

## 2012-07-17 NOTE — Telephone Encounter (Signed)
Called patient to clarify her medications.

## 2012-07-17 NOTE — Telephone Encounter (Signed)
Confused about medications would like to speak to you

## 2012-07-22 ENCOUNTER — Ambulatory Visit (INDEPENDENT_AMBULATORY_CARE_PROVIDER_SITE_OTHER): Payer: BC Managed Care – PPO | Admitting: Psychology

## 2012-07-22 DIAGNOSIS — F4325 Adjustment disorder with mixed disturbance of emotions and conduct: Secondary | ICD-10-CM

## 2012-07-22 DIAGNOSIS — F329 Major depressive disorder, single episode, unspecified: Secondary | ICD-10-CM

## 2012-07-22 NOTE — Progress Notes (Signed)
Presenting Problem Chief Complaint: About a month and a half ago she started experiencing panic/anxiety attacks.  The last one she was in her kitchen having a cup of coffee and she started crying and shaking and it was scary to her husband.  She had something happen last October about her husband not being forthright with her about property and finances and he started telling her they needed to sell their home.  She was 'abnormally upset'.  He told her for financial reasons they need to downsize but he has not explained why.  In the past 2.5 years she has had a lot of losses with family and friends.  The main person that passed was her paternal aunt with whom she was very close and seemed to love her the most of anyone 'in the world'.  She was very protective of me, every holiday, every summer, she was really really a savior to me.  She took me to church with her as a catholic and was a very good catholic woman.  The patient was surprised that she was crying when talking about her.  She grew up with her aunt's family.  They were both military family's and they often were stationed in the same place and her cousin was more like her sister.  Last August 2012 her aunt's daughter was going in for a routine knee replacement and ended up in a coma and never regained consciousness.  Her vest friend then died in 01/16/13.  She has recently began having trust issues with her spouse because he wanted her to sign paperws to list the home.  She has been very upset about this and sought out legal assistance and was advised to not sign anyone.  What are the main stressors in your life right now, how long? Depression  2, Racing Thoughts   1, Irritability   1, Excessive Worrying   2, Marital Stress   2, Low Energy   1, Panic Attacks   2 and Change in Sexual Interest   3 She has been experiencing these symptoms for the past six months.  Previous mental health services Have you ever been treated for a mental health  problem, when, where, by whom? Yes  Marital counseling around 58 for 2-3 months.   Are you currently seeing a therapist or counselor, counselor's name? Yes   Have you ever had a mental health hospitalization, how many times, length of stay? No   Have you ever been treated with medication, name, reason, response? Yes see current medication  Have you ever had suicidal thoughts or attempted suicide, when, how? No   Risk factors for Suicide Demographic factors:  None Current mental status: None Loss factors: Loss of significant relationship Historical factors: Family history of mental illness or substance abuse and Anniversary of important loss Risk Reduction factors: Sense of responsibility to family, Religious beliefs about death, Living with another person, especially a relative and Positive coping skills or problem solving skills Clinical factors:  Severe Anxiety and/or Agitation Cognitive features that contribute to risk: None    SUICIDE RISK:  Minimal: No identifiable suicidal ideation.  Patients presenting with no risk factors but with morbid ruminations; may be classified as minimal risk based on the severity of the depressive symptoms  Medical history Medical treatment and/or problems, explain: Yes fibro myalgia (dx 20 years ago and ignored), recent cardiac event, hx knee replacement, HTN Do you have any issues with chronic pain?  Yes back and left leg pain  receives non-steroidal injection, stress increases body aches 3/month and lasts 2-3 days Name of primary care physician/last physical exam: [facility]  Allergies: No   Current medications: see medication record Is there any history of mental health problems or substance abuse in your family, whom? Yes father alcoholic and died of pancreatistis Has anyone in your family been hospitalized, who, where, length of stay? Yes At the [place],  He died at age 36.  Social/family history Have you been married, how  many times?  Two times.  First marriage age 4 for seven years; still really good friends.  She was single for about five years before meeting her husband.  They dated for 2.5 years prior to marrying.  Do you have children?  One daughter Thornton Dales age 64, and two stepsons Kayliana Codd (09) and Dr. Charolotte Eke (38) and one stepdaughter Kendrick Ranch [phone]).  How many pregnancies have you had?  Two, miscarriage around 27 and she didn't even know she was pregnant but went to hospital because she was bleeding heavily.  Who lives in your current household? The patient lives in [place] in a single family home with her husband of 26 years.  Military history: No   Religious/spiritual involvement:  What religion/faith base are you? She is [employer] and attends [employer] in [place].  Family of origin (childhood history)  Where were you born? She was born in [place] Where did you grow up? She grew up in at least nine different places.  She lived for 3 years in [place] and that is the place she was the longest as a child. How many different homes have you lived? nine Describe the atmosphere of the household where you grew up:  Neat and orderly where ever they lived, [employer] housing, very fun  Do you have siblings, step/half siblings, list names, relation, sex, age? Yes Two brothers, nine year younger brother and a brother four years younger.  Her middle sibling did not mature well and has had trouble holding a job, has never been married, and lives with her mother.  She has always been the person her brothers came to for money.  Her brother made some very hurtful remarks about her to their cousins and she was deeply hurt and would wake up in the middle of the night thinking about his words.   Are your parents separated/divorced, when and why?  Parents were married until the patient's father passed when he was 54.  Are your parents alive? Yes mother, No  father passed from pancreatitis at age 10.  Social supports (personal and professional): God, no one else at this time since her closest family and friends passed within the past year.  Education How many grades have you completed? Masters degree, and Licensed educational administration from [employer] Did you have any problems in school, what type? No  Medications prescribed for these problems? 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  In college she worked at [employer].  Legal history None  Trauma/Abuse history: Have you ever been exposed to any form of abuse, what type? She thinks her mother was verbally abusive.  Have you ever been exposed to something traumatic, describe? No   Substance use Do you use Caffeine? Yes Type, frequency? 1/2 cup coffee  daily  Do you use Nicotine? No   Do you use Alcohol? Yes Type, frequency? Glass of wine, last time three weeks  How old were you went you first tasted alcohol? 22 (father would give the kids a little drink weekly to 'flush their kidneys out' Was this accepted by your family? Yes  Have you ever used illicit drugs or taken more than prescribed, type, frequency, date of last usage? No   Mental Status: General Appearance Luretha Murphy:  Neat Eye Contact:  Good Motor Behavior:  Normal Speech:  Normal Level of Consciousness:  Alert Mood:  Euthymic Affect:  Tearful Anxiety Level:  Minimal Thought Process:  Coherent and Relevant Thought Content:  WNL Perception:  Normal Judgment:  Good Insight:  Present Cognition:  Orientation time, place and person  Diagnosis AXIS I Adjustment Disorder with Mixed Disturbance of Emotions and Conduct  AXIS II No diagnosis  AXIS III Past Medical History  Diagnosis Date  . Hypertension   . Postmenopausal bleeding   .  Fibromyalgia     [name]  . Asthma     AXIS IV problems with primary support group  AXIS V 61-70 mild symptoms   Plan: Meet again in about one week.  _________________________________________           Magnus Sinning. Charlean Merl, [place] LPC/ Date

## 2012-07-23 ENCOUNTER — Encounter (HOSPITAL_COMMUNITY): Payer: Self-pay | Admitting: Psychology

## 2012-08-04 ENCOUNTER — Telehealth (HOSPITAL_COMMUNITY): Payer: Self-pay | Admitting: Psychiatry

## 2012-08-04 ENCOUNTER — Encounter (HOSPITAL_COMMUNITY): Payer: Self-pay | Admitting: Psychology

## 2012-08-04 ENCOUNTER — Ambulatory Visit (INDEPENDENT_AMBULATORY_CARE_PROVIDER_SITE_OTHER): Payer: BC Managed Care – PPO | Admitting: Psychology

## 2012-08-04 DIAGNOSIS — F329 Major depressive disorder, single episode, unspecified: Secondary | ICD-10-CM

## 2012-08-04 NOTE — Progress Notes (Signed)
   THERAPIST PROGRESS NOTE  Session Time: 206-300 pm  Participation Level: Active  Behavioral Response: NeatAlertEuthymic  Type of Therapy: Individual Therapy  Treatment Goals addressed: Anxiety and Coping  Interventions: Solution Focused, Strength-based, Psychosocial Skills: goal setting, anxiety, sleep hygiene and Supportive  Summary: Sara [name] is a 61 y.o. female who presents as pleasant and easily engaged.  She is having a significant amount of difficulty with being tired but unable to sleep.  I consulted Dr. Laury Deep who requests that patient take her Zoloft 2-25mg  tablets in the morning instead of in the evening in attempt to improve sleep.  The patient is also questioning why she is taking the [facility] when she has seen no real benefit since the first month or so after beginning back in November 2012.  She will follow-up with him at her next appointment.  This counselor and the patient developed her treatment plan to include goals of increasing trusts in family relationships, improved sleep, improved ability to manage anxiety, and coping effectively with grief/loss issues.  Suicidal/Homicidal: No  Plan: Return again in 2 weeks.  Diagnosis: Axis I: Major Depression, single episode; GAD    Axis II: No diagnosis    Salley Scarlet, [facility] 08/04/2012

## 2012-08-04 NOTE — Telephone Encounter (Signed)
Patient informed her therapist, Elray Buba, that she was having difficulty sleeping.   PLAN:  Change sertraline to Zoloft to 50 mg ( two 25 mg tablets) daily in the morning.

## 2012-08-05 ENCOUNTER — Ambulatory Visit (INDEPENDENT_AMBULATORY_CARE_PROVIDER_SITE_OTHER): Payer: BC Managed Care – PPO | Admitting: Psychiatry

## 2012-08-05 ENCOUNTER — Encounter (HOSPITAL_COMMUNITY): Payer: Self-pay | Admitting: Psychiatry

## 2012-08-05 VITALS — BP 120/74 | HR 60 | Ht 64.0 in | Wt 224.0 lb

## 2012-08-05 DIAGNOSIS — F329 Major depressive disorder, single episode, unspecified: Secondary | ICD-10-CM

## 2012-08-05 DIAGNOSIS — F332 Major depressive disorder, recurrent severe without psychotic features: Secondary | ICD-10-CM
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
## 2012-08-11 ENCOUNTER — Ambulatory Visit (HOSPITAL_COMMUNITY): Payer: BC Managed Care – PPO | Admitting: Psychiatry

## 2012-08-18 ENCOUNTER — Ambulatory Visit (INDEPENDENT_AMBULATORY_CARE_PROVIDER_SITE_OTHER): Payer: BC Managed Care – PPO | Admitting: Psychology

## 2012-08-18 DIAGNOSIS — G4700 Insomnia, unspecified: Secondary | ICD-10-CM

## 2012-08-18 DIAGNOSIS — F329 Major depressive disorder, single episode, unspecified: Secondary | ICD-10-CM

## 2012-08-20 ENCOUNTER — Telehealth (HOSPITAL_COMMUNITY): Payer: Self-pay | Admitting: Psychology

## 2012-08-20 ENCOUNTER — Encounter (HOSPITAL_COMMUNITY): Payer: Self-pay | Admitting: Psychology

## 2012-08-20 NOTE — Progress Notes (Signed)
   THERAPIST PROGRESS NOTE  Session Time: 208- 314 pm  Participation Level: Active  Behavioral Response: NeatAlertEuthymic  Type of Therapy: Individual Therapy  Treatment Goals addressed: Communication: with husband and Coping  Interventions: Solution Focused, Strength-based, Psychosocial Skills: coping, communication, self-care, progressive muscle relaxation and Supportive  Summary: Sara [name] is a 61 y.o. female who presents as pleasant and easily engaged.  Her hair is styled very differently today and is straightened and looks nice; the patient was pleased because I didn't recognize her and thought she looked much younger.  The patient reports she is doing much better than when she first entered treatment.  She believes that discontinuing the medication has been helpful because she did not believe she was experiencing any improvement with the medication and the side effects made things worse.  The patient was very productive in the past two weeks.  She took herself to [place] where her aunt and her cousin are buried and visited each at the cemetary.  She believes this was helpful as she told them she needed to move forward in her life and not stay stuck and has a sense of relief since doing so.  She spent four uninterrupted hours relaxing at home with her favorite blanket and some relaxing music.  She talked with her husband and expressed her concerns and informed him that if things didn't change she was going to end their marriage.  The patient's husband was surprised but has responded favorably and she is pleased.  She would like to work on relaxation techniques and during the session I walked her through a progressive muscle relaxation exercise.  Suicidal/Homicidal: No  Plan: Return again in 2 weeks.  Diagnosis: Axis I: Major depressive disorder    Axis II: No diagnosis    Salley Scarlet, [facility] 08/20/2012

## 2012-08-20 NOTE — Telephone Encounter (Signed)
Message copied by Salley Scarlet on Wed Aug 20, 2012  9:15 AM ------      Message from: Hassan Rowan      Created: Tue Aug 19, 2012  6:35 PM      Regarding: RE: Request to consult patient's PCP      Contact: [phone]       Pleaswse set up an appointment with me and Ms Ivin Booty.      ----- Message -----         From: Salley Scarlet, [facility]         Sent: 08/18/2012   2:30 PM           To: Larena Sox, MD, Hassan Rowan, MD      Subject: Request to consult patient's PCP                         Ms. Pennisi continues to have significant sleep loss issues with non-restorative sleep.  She would like you to consult Dr. Hassan Rowan at [facility] at [phone] regarding medication to assist her with this issue.

## 2012-08-20 NOTE — Telephone Encounter (Signed)
Patient informed of Sept 5th 1045 am appointment with Dr. Thurmond Butts to discuss ongoing sleep issues.  I expect that Dr. Laury Deep will consult Dr. Thurmond Butts prior to this appointment as requested in previous correspondence (and upon his return to the office next week).

## 2012-08-26 ENCOUNTER — Other Ambulatory Visit: Payer: Self-pay | Admitting: Cardiology

## 2012-08-26 ENCOUNTER — Telehealth (HOSPITAL_COMMUNITY): Payer: Self-pay | Admitting: Psychiatry

## 2012-08-26 MED ORDER — POTASSIUM CHLORIDE CRYS ER 20 MEQ PO TBCR: 20.0000 meq | EXTENDED_RELEASE_TABLET | Freq: Every day | ORAL | Status: DC

## 2012-08-26 MED ORDER — METOPROLOL TARTRATE 50 MG PO TABS: 50.0000 mg | ORAL_TABLET | Freq: Two times a day (BID) | ORAL | Status: DC

## 2012-09-01 NOTE — Telephone Encounter (Signed)
Will see patient on 09/02/2012.

## 2012-09-02 ENCOUNTER — Ambulatory Visit (HOSPITAL_COMMUNITY): Payer: Self-pay | Admitting: Psychiatry

## 2012-09-03 ENCOUNTER — Ambulatory Visit (INDEPENDENT_AMBULATORY_CARE_PROVIDER_SITE_OTHER): Payer: BC Managed Care – PPO | Admitting: Psychology

## 2012-09-03 ENCOUNTER — Encounter (HOSPITAL_COMMUNITY): Payer: Self-pay | Admitting: Psychology

## 2012-09-03 DIAGNOSIS — F329 Major depressive disorder, single episode, unspecified: Secondary | ICD-10-CM
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
## 2012-09-04 ENCOUNTER — Encounter: Payer: Self-pay | Admitting: Family Medicine

## 2012-09-04 ENCOUNTER — Ambulatory Visit (INDEPENDENT_AMBULATORY_CARE_PROVIDER_SITE_OTHER): Payer: BC Managed Care – PPO | Admitting: Family Medicine

## 2012-09-04 VITALS — BP 129/89 | HR 81 | Ht 64.0 in | Wt 226.0 lb

## 2012-09-04 DIAGNOSIS — I499 Cardiac arrhythmia, unspecified: Secondary | ICD-10-CM

## 2012-09-04 DIAGNOSIS — G4720 Circadian rhythm sleep disorder, unspecified type: Secondary | ICD-10-CM

## 2012-09-04 DIAGNOSIS — R7309 Other abnormal glucose: Secondary | ICD-10-CM

## 2012-09-04 DIAGNOSIS — R739 Hyperglycemia, unspecified: Secondary | ICD-10-CM

## 2012-09-04 DIAGNOSIS — I10 Essential (primary) hypertension: Secondary | ICD-10-CM

## 2012-09-04 LAB — COMPLETE METABOLIC PANEL WITH GFR
Albumin: 4.4 g/dL (ref 3.5–5.2)
Alkaline Phosphatase: 60 U/L (ref 39–117)
BUN: 16 mg/dL (ref 6–23)
Calcium: 9.8 mg/dL (ref 8.4–10.5)
GFR, Est African American: 77 mL/min
GFR, Est Non African American: 67 mL/min
Glucose, Bld: 91 mg/dL (ref 70–99)
Potassium: 4.5 mEq/L (ref 3.5–5.3)

## 2012-09-04 LAB — CBC WITH DIFFERENTIAL/PLATELET
Basophils Absolute: 0 10*3/uL (ref 0.0–0.1)
HCT: 41.3 % (ref 36.0–46.0)
Lymphocytes Relative: 40 % (ref 12–46)
Neutro Abs: 4 10*3/uL (ref 1.7–7.7)
Neutrophils Relative %: 50 % (ref 43–77)
Platelets: 335 10*3/uL (ref 150–400)
RDW: 13.9 % (ref 11.5–15.5)
WBC: 7.9 10*3/uL (ref 4.0–10.5)

## 2012-09-04 LAB — POCT GLYCOSYLATED HEMOGLOBIN (HGB A1C): Hemoglobin A1C: 5.7

## 2012-09-04 LAB — LIPID PANEL
Cholesterol: 187 mg/dL (ref 0–200)
Total CHOL/HDL Ratio: 3.5 Ratio

## 2012-09-04 MED ORDER — TORSEMIDE 20 MG PO TABS: 20.0000 mg | ORAL_TABLET | Freq: Every day | ORAL | Status: DC

## 2012-09-04 MED ORDER — ZOLPIDEM TARTRATE 10 MG PO TABS: 10.0000 mg | ORAL_TABLET | Freq: Every evening | ORAL | Status: DC | PRN

## 2012-09-04 MED ORDER — METOPROLOL SUCCINATE ER 100 MG PO TB24: 50.0000 mg | ORAL_TABLET | Freq: Every day | ORAL | Status: DC

## 2012-09-04 MED ORDER — POTASSIUM CHLORIDE CRYS ER 20 MEQ PO TBCR: 20.0000 meq | EXTENDED_RELEASE_TABLET | Freq: Every day | ORAL | Status: DC

## 2012-09-04 MED ORDER — TRAZODONE HCL 150 MG PO TABS: 75.0000 mg | ORAL_TABLET | Freq: Every day | ORAL | Status: DC

## 2012-09-04 NOTE — Patient Instructions (Signed)

## 2012-09-04 NOTE — Progress Notes (Signed)
Subjective:    Patient ID: Sara [name], female    DOB: December 29, 1951, 61 y.o.   MRN: [id number]  HPI  #1 Panic Attacks/depression. Patient has been sent down to counseling at the [facility] on lower floor. She states that since that has been in place things are doing better for her at this time she does report that they try to start her on it another medication an antidepressant and it did not work for her. #2 hypertension. Blood pressure slightly higher diastolic number than what we would like to see. #3 chronic L knee pain. She is getting special injections from the orthopedic for her knee. She states that the medication has in case of chronic back pain and is trying it for her knee at this time. She states first time the knee was injected it worked great but this last time when was injected it didn't seem to do hot anything for her. She raises the question if I can talk to the orthopedic about this but I think is best for her to call directly. #4 insomnia she is having trouble for her sleep. She's been on Ambien for about 15 years she states that she has tried the extended-release and that causes side effects. She realizes though that the recommendation is that we have to Ambien for woman her age which he definitely want to have done at this time. #5 history of arrhythmia. She raises the question about whether she should have an EKG now. I recommend in about 6 months when she has yearly exam that can be done then.  #6 hyperlipidemia needs to be rechecked. #7 history of elevated blood sugars before   Review of Systems  Constitutional: Positive for activity change.  Musculoskeletal: Positive for joint swelling and arthralgias.  Psychiatric/Behavioral: Positive for disturbed wake/sleep cycle and dysphoric mood. The patient is nervous/anxious.   All other systems reviewed and are negative.     BP 129/89  Pulse 81  Ht 5\' 4"  (1.626 m)  Wt 226 lb (102.513 kg)  BMI 38.79 kg/m2   SpO2 96% Objective:   Physical Exam  Vitals reviewed. Constitutional: She is oriented to [name], place, and time. She appears well-developed and well-nourished.  HENT:  Head: Normocephalic.  Eyes: Pupils are equal, round, and reactive to light.  Neck: Normal range of motion. Neck supple.  Cardiovascular: Normal rate, regular rhythm and normal heart sounds.   Pulmonary/Chest: Effort normal and breath sounds normal. No respiratory distress.  Musculoskeletal: Normal range of motion.  Neurological: She is alert and oriented to [name], place, and time.  Skin: Skin is warm and dry.  Psychiatric: She has a normal mood and affect.      Results for orders placed in visit on 09/04/12  POCT GLYCOSYLATED HEMOGLOBIN (HGB A1C)      Component Value Range   Hemoglobin A1C 5.7     Assessment & Plan:  #1 panic attack/depression. Encourage her to continue with counseling., The Klonopin doesn't seem to be successful her psychiatrist may want to increase it to 1 mg to 2 mg twice a day.   #2 hypertension. Blood pressures not really under control at this time. We are going to stop the metoprolol and try to get her on Toprol-XL. We will take her to 100 mg one half tablet a day and if her blood pressures not better when she returns in 3 months we'll increase it to a whole tablet a day.  #3 sleep disturbance. We'll place her on  Desyrel. Explained the Desyrel is an antidepressant but we'll using it as a sleeping agent started 150 mg tablet she can take one half to one tablet at night and see if it makes a difference with her sleep.  #4 hyperlipidemia. She is fasting we'll check a lipid panel on her CMP and CBC.  #5 history of arrhythmia. We'll follow this when she comes in for her yearly exam and we do an EKG on her. #6 elevated blood sugars. She's had a history of elevated blood sugar but A1c is 5.7 which is great.   #7 knee pain patient has surgical scar over the left knee is swollen. This time I'm going to  defer to her orthopedics specialist and asked her to contact him about more injections or other treatment. Return in 3 months for followup

## 2012-09-08 ENCOUNTER — Ambulatory Visit (HOSPITAL_COMMUNITY): Payer: Self-pay | Admitting: Psychiatry

## 2012-09-11 ENCOUNTER — Ambulatory Visit (INDEPENDENT_AMBULATORY_CARE_PROVIDER_SITE_OTHER): Payer: BC Managed Care – PPO | Admitting: Family Medicine

## 2012-09-11 ENCOUNTER — Encounter: Payer: Self-pay | Admitting: Family Medicine

## 2012-09-11 VITALS — BP 121/75 | HR 55 | Temp 97.9°F | Ht 64.0 in | Wt 225.0 lb

## 2012-09-11 DIAGNOSIS — R21 Rash and other nonspecific skin eruption: Secondary | ICD-10-CM

## 2012-09-11 LAB — POCT RAPID STREP A (OFFICE): Rapid Strep A Screen: NEGATIVE

## 2012-09-11 MED ORDER — PREDNISONE (PAK) 10 MG PO TABS: 10.0000 mg | ORAL_TABLET | Freq: Every day | ORAL | Status: AC

## 2012-09-11 MED ORDER — LORATADINE 10 MG PO TABS: 10.0000 mg | ORAL_TABLET | Freq: Every day | ORAL | Status: DC | PRN

## 2012-09-11 NOTE — Progress Notes (Signed)
Subjective:    Patient ID: Sara [name], female    DOB: Nov 23, 1951, 61 y.o.   MRN: [id number]  Rash This is a new problem. The current episode started in the past 7 days (Tuesday). The problem has been gradually worsening since onset. The affected locations include the back, chest, face, neck, left lower leg and right lower leg. The rash is characterized by dryness, redness and burning. She was exposed to nothing. Associated symptoms include facial edema and joint pain. Pertinent negatives include no anorexia, congestion, cough, diarrhea, eye pain, fatigue, fever, nail changes, rhinorrhea, shortness of breath, sore throat or vomiting. Past treatments include nothing. Her past medical history is significant for allergies and eczema. There is no history of asthma or varicella.      Review of Systems  Constitutional: Negative for fever and fatigue.       Patient has rash on back but has not started taking new medications diuretic or joint medication  HENT: Negative for congestion, sore throat and rhinorrhea.   Eyes: Negative for pain.  Respiratory: Negative for cough and shortness of breath.   Gastrointestinal: Negative for vomiting, diarrhea and anorexia.  Musculoskeletal: Positive for joint pain.  Skin: Positive for rash. Negative for nail changes.  All other systems reviewed and are negative.      BP 121/75  Pulse 55  Temp 97.9 F (36.6 C) (Oral)  Ht 5\' 4"  (1.626 m)  Wt 225 lb (102.059 kg)  BMI 38.62 kg/m2  SpO2 97% Objective:   Physical Exam  Constitutional: She is oriented to [name], place, and time. She appears well-developed and well-nourished.  HENT:  Head: Normocephalic.  Neck: Normal range of motion. Neck supple.  Cardiovascular: Normal rate.   Pulmonary/Chest: Effort normal.  Neurological: She is alert and oriented to [name], place, and time.  Skin: Rash noted.       scaly raised red rash on face, back, chest,and lower legs  Psychiatric: She has a normal mood and  affect. Her behavior is normal.     Results for orders placed in visit on 09/04/12  POCT GLYCOSYLATED HEMOGLOBIN (HGB A1C)      Component Value Range   Hemoglobin A1C 5.7    LIPID PANEL      Component Value Range   Cholesterol 187  0 - 200 mg/dL   Triglycerides 53  <782 mg/dL   HDL 53  >95 mg/dL   Total CHOL/HDL Ratio 3.5     VLDL 11  0 - 40 mg/dL   LDL Cholesterol 621 (*) 0 - 99 mg/dL  COMPLETE METABOLIC PANEL WITH GFR      Component Value Range   Sodium 143  135 - 145 mEq/L   Potassium 4.5  3.5 - 5.3 mEq/L   Chloride 106  96 - 112 mEq/L   CO2 31  19 - 32 mEq/L   Glucose, Bld 91  70 - 99 mg/dL   BUN 16  6 - 23 mg/dL   Creat 3.08  6.57 - 8.46 mg/dL   Total Bilirubin 0.6  0.3 - 1.2 mg/dL   Alkaline Phosphatase 60  39 - 117 U/L   AST 28  0 - 37 U/L   ALT 15  0 - 35 U/L   Total Protein 7.0  6.0 - 8.3 g/dL   Albumin 4.4  3.5 - 5.2 g/dL   Calcium 9.8  8.4 - 96.2 mg/dL   GFR, Est African American 77     GFR, Est Non African American 67  CBC WITH DIFFERENTIAL      Component Value Range   WBC 7.9  4.0 - 10.5 K/uL   RBC 4.56  3.87 - 5.11 MIL/uL   Hemoglobin 14.2  12.0 - 15.0 g/dL   HCT 21.3  08.6 - 57.8 %   MCV 90.6  78.0 - 100.0 fL   MCH 31.1  26.0 - 34.0 pg   MCHC 34.4  30.0 - 36.0 g/dL   RDW 46.9  62.9 - 52.8 %   Platelets 335  150 - 400 K/uL   Neutrophils Relative 50  43 - 77 %   Neutro Abs 4.0  1.7 - 7.7 K/uL   Lymphocytes Relative 40  12 - 46 %   Lymphs Abs 3.2  0.7 - 4.0 K/uL   Monocytes Relative 7  3 - 12 %   Monocytes Absolute 0.5  0.1 - 1.0 K/uL   Eosinophils Relative 2  0 - 5 %   Eosinophils Absolute 0.2  0.0 - 0.7 K/uL   Basophils Relative 1  0 - 1 %   Basophils Absolute 0.0  0.0 - 0.1 K/uL   Smear Review Criteria for review not met    TSH      Component Value Range   TSH 1.101  0.350 - 4.500 uIU/mL   Results for orders placed in visit on 09/11/12  POCT RAPID STREP A (OFFICE)      Component Value Range   Rapid Strep A Screen Negative  Negative     Assessment & Plan:    Dx allergic reaction source unknowqn. Will rule out streptococcus infection w/rapid strep. If negative will treat w/ a 12 day prednisone and claritin 10 mg.

## 2012-09-11 NOTE — Patient Instructions (Signed)
Rash A rash is a change in the color or texture of your skin. There are many different types of rashes. You may have other problems that accompany your rash. CAUSES   Infections.   Allergic reactions. This can include allergies to pets or foods.   Certain medicines.   Exposure to certain chemicals, soaps, or cosmetics.   Heat.   Exposure to poisonous plants.   Tumors, both cancerous and noncancerous.  SYMPTOMS   Redness.   Scaly skin.   Itchy skin.   Dry or cracked skin.   Bumps.   Blisters.   Pain.  DIAGNOSIS  Your caregiver may do a physical exam to determine what type of rash you have. A skin sample (biopsy) may be taken and examined under a microscope. TREATMENT  Treatment depends on the type of rash you have. Your caregiver may prescribe certain medicines. For serious conditions, you may need to see a skin doctor (dermatologist). HOME CARE INSTRUCTIONS   Avoid the substance that caused your rash.   Do not scratch your rash. This can cause infection.   You may take cool baths to help stop itching.   Only take over-the-counter or prescription medicines as directed by your caregiver.   Keep all follow-up appointments as directed by your caregiver.  SEEK IMMEDIATE MEDICAL CARE IF:  You have increasing pain, swelling, or redness.   You have a fever.   You have new or severe symptoms.   You have body aches, diarrhea, or vomiting.   Your rash is not better after 3 days.  MAKE SURE YOU:  Understand these instructions.   Will watch your condition.   Will get help right away if you are not doing well or get worse.  Document Released: 12/07/2002 Document Revised: 12/06/2011 Document Reviewed: 10/01/2011 ExitCare Patient Information 2012 [employer]. 

## 2012-09-23 ENCOUNTER — Ambulatory Visit (INDEPENDENT_AMBULATORY_CARE_PROVIDER_SITE_OTHER): Payer: BC Managed Care – PPO | Admitting: Family Medicine

## 2012-09-23 ENCOUNTER — Ambulatory Visit (HOSPITAL_COMMUNITY): Payer: BC Managed Care – PPO | Admitting: Psychology

## 2012-09-23 ENCOUNTER — Encounter: Payer: Self-pay | Admitting: Family Medicine

## 2012-09-23 DIAGNOSIS — Z23 Encounter for immunization: Secondary | ICD-10-CM

## 2012-09-23 NOTE — Progress Notes (Signed)
Patient ID: Sara [name], female   DOB: [birth year], 61 y.o.   MRN: [id number] Patient given flu vaccination.

## 2012-09-23 NOTE — Patient Instructions (Addendum)
No new instructionsInactivated Influenza Vaccine What You Need to Know WHY GET VACCINATED?  Influenza ("flu") is a contagious disease.   It is caused by the influenza virus, which can be spread by coughing, sneezing, or nasal secretions.   Anyone can get influenza, but rates of infection are highest among children. For most people, symptoms last only a few days. They include:   Fever or chills.   Sore throat.   Muscle aches.   Fatigue.   Cough.   Headache.   Runny or stuffy nose.  Other illnesses can have the same symptoms and are often mistaken for influenza. Young children, people 71 and older, pregnant women, and people with certain health conditions, such as heart, lung or kidney disease, or a weakened immune system can get much sicker. Flu can cause high fever and pneumonia, and make existing medical conditions worse. It can cause diarrhea and seizures in children. Each year thousands of people die from influenza and even more require hospitalization. By getting flu vaccine, you can protect yourself from influenza and may also avoid spreading influenza to others. INACTIVATED INFLUENZA VACCINE There are 2 types of influenza vaccine:  Inactivated (killed) vaccine, the "flu shot", is given by injection with a needle.   Live, attenuated (weakened) influenza vaccine is sprayed into the nostrils. This vaccine is described in a separate Vaccine Information Statement.  A "high-dose" inactivated influenza vaccine is available for people 49 years of age and older. Ask your doctor for more information.  In?uenza viruses are always changing, so annual vaccination is recommended. Each year scientists try to match the viruses in the vaccine to those most likely to cause ?u that year. Flu vaccine will not prevent disease from other viruses, including flu viruses not contained in the vaccine. It takes up to 2 weeks for protection to develop after the shot. Protection lasts about 1 year. Some  inactivated influenza vaccine contains a preservative called thimerosal. Thimerosal-free influenza vaccine is available. Ask your doctor for more information. WHO SHOULD GET INACTIVATED INFLUENZA VACCINE AND WHEN? WHO  All people 16 months of age and older should get flu vaccine.   Vaccination is especially important for people at higher risk of severe influenza and their close contacts, including healthcare personnel and close contacts of children younger than 6 months.  WHEN Getting the vaccine as soon as it is available. This should provide protection if the ?u season comes early. You can get the vaccine as long as illness is occurring in your community. In?uenza can occur at any time, but most influenza occurs from October through May. In recent seasons, most infections have occurred in January and February. Getting vaccinated in December, or even later, will still be beneficial in most years. Adults and older children need 1 dose of influenza vaccine each year. But some children younger than 17 years of age need 2 doses to be protected. Ask your doctor. In?uenza vaccine may be given at the same time as other vaccines, including pneumococcal vaccine. SOME PEOPLE SHOULD NOT GET INACTIVATED INFLUENZA VACCINE OR SHOULD WAIT  Tell your doctor if you have any severe (life-threatening) allergies, including a severe allergy to eggs. A severe allergy to any vaccine component may be a reason not to get the vaccine. Allergic reactions to influenza vaccine are rare.   Tell your doctor if you ever had a severe reaction after a dose of influenza vaccine.   Tell your doctor if you ever had Guillain-Barr syndrome (a severe paralytic illness, also called  GBS). Your doctor will help you decide whether the vaccine is recommended for you.   People who are moderately or severely ill should usually wait until they recover before getting the ?u vaccine. If you are ill, talk to your doctor about whether to  reschedule the vaccination. People with a mild illness can usually get the vaccine.  WHAT ARE THE RISKS FROM INACTIVATED INFLUENZA VACCINE? A vaccine, like any medicine, could possibly cause serious problems, such as severe allergic reactions. The risk of a vaccine causing serious harm, or death, is extremely small. Serious problems from inactivated influenza vaccine are very rare. The viruses in inactivated influenza vaccine have been killed, so you cannot get influenza from the vaccine. Mild problems:  Soreness, redness, or swelling where the shot was given.   Hoarseness; sore, red or itchy eyes; cough.   Fever.   Aches.   Headache.   Itching.   Fatigue.  If these problems occur, they usually begin soon after the shot and last 1 to 2 days. Moderate problems: Young children who get inactivated flu vaccine and pneumococcal vaccine (PCV13) at the same time appear to be at increased risk for seizures caused by fever. Ask your doctor for more information. Tell your doctor if a child who is getting flu vaccine has ever had a seizure. Severe problems:  Life-threatening allergic reactions from vaccines are very rare. If they do occur, it is usually within a few minutes to a few hours after the shot.   In 1976, a type of inactivated influenza (swine ?u) vaccine was associated with Guillan-Barr syndrome (GBS). Since then, ?u vaccines have not been clearly linked to GBS. However, if there is a risk of GBS from current ?u vaccines, it would be no more than 1 or 2 cases per million people vaccinated. This is much lower than the risk of severe influenza, which can be prevented by vaccination.  The safety of vaccines is always being monitored. For more information, visit:  PrintingMaps.se and   https://www.farmer-stevens.info/  One brand of inactivated flu vaccine, called Afluria, should not be given to children 59 years of age or  younger, except in special circumstances. A related vaccine was associated with fevers and fever-related seizures in young children in [place]. Your doctor can give you more information. WHAT IF THERE IS A SEVERE REACTION? What should I look for? Any unusual condition, such as a high fever or unusual behavior. Signs of a serious allergic reaction can include difficulty breathing, hoarseness or wheezing, hives, paleness, weakness, a fast heartbeat, or dizziness. What should I do?  Call a doctor, or get the person to a doctor right away.   Tell your doctor what happened, the date and time it happened, and when the vaccination was given.   Ask your doctor, nurse, or health department to report the reaction by ?ling a Vaccine Adverse Event Reporting System (VAERS) form. Or, you can ?le this report through the VAERS website at www.vaers.LAgents.no or by calling 1-445 736 9113.  VAERS does not provide medical advice. THE NATIONAL VACCINE INJURY COMPENSATION PROGRAM The National Vaccine Injury Compensation Program (VICP) was created in 1986. People who believe they may have been injured by a vaccine can learn about the program and about ?ling a claim by calling 1-250-460-1424, or visiting the VICP website at SpiritualWord.at HOW CAN I LEARN MORE?  Ask your doctor. They can give you the vaccine package insert or suggest other sources of information.   Call your local or state health department.  Contact the Centers for Disease Control and Prevention (CDC):   Call (629) 349-7049 (1-800-CDC-INFO) or   Visit the CDC's website at BiotechRoom.com.cy  CDC Inactivated Influenza Vaccine VIS (07/02/11) Document Released: 10/11/2006 Document Revised: 12/06/2011 Document Reviewed: 07/02/2011 [facility] Patient Information 2012 [place], [place].

## 2012-10-02 ENCOUNTER — Ambulatory Visit (INDEPENDENT_AMBULATORY_CARE_PROVIDER_SITE_OTHER): Payer: BC Managed Care – PPO | Admitting: Psychology

## 2012-10-02 DIAGNOSIS — F329 Major depressive disorder, single episode, unspecified: Secondary | ICD-10-CM

## 2012-10-03 ENCOUNTER — Encounter (HOSPITAL_COMMUNITY): Payer: Self-pay | Admitting: Psychology

## 2012-10-03 NOTE — Progress Notes (Signed)
   THERAPIST PROGRESS NOTE  Session Time: 1100- 1150  Participation Level: Active  Behavioral Response: NeatAlertEuthymic  Type of Therapy: Individual Therapy  Treatment Goals addressed: Communication: with husband and Coping  Interventions: Solution Focused, Strength-based, Psychosocial Skills: coping, communication, anger and Supportive  Summary: Sara [name] is a 61 y.o. female who presents as pleasant and easily engaged.  She appears to have lost some weight and volunteered that she has been on weight watchers for two weeks and has already lost 5 pounds; she has been exercising as well.  The patient reports she notices that she feels better and has more energy and plans to continue.  She enjoyed her trip to the beach except for the intimacy with her husband because of a behavior that he engaged that she felt disrespected and turned off by him.  The patient reports that she isn't sure what she plans to do about her marriage but at this time isn't going to end the marriage.  She is pleased that she now speaks up and feels more empowered to state her thoughts and feelings.  She feels irritated with him that he expects her to sell some of her personal treasures but that he won't part with his own.  He has a fleet of about 12 classic cars and will not part with them, with his mother's home, or his aunt's home, but wants her to sell some miniature figurines (valued at most $45 each).  She has been feeling sad that the home her aunt owned is likely going to be sold because her aunt's son doesn't want to move in to it.  She is also grieving her ability to go 'home' to [place] [employer] [place] was raised because of the most recent news that only authorized personnel will get in.  I talked with the patient about memories not being contained in spaces but being contained in her heart and mind and that they go everywhere with her.  She admits that she has photos of each of the places she lived and I  encouraged her to make a collage of pictures and to display so that she can view.  She feels especially attached to her home since living there for greater than 15 years.  Her husband still doesn't understand her attachment but yet won't acknowledge that he too has attachments (as listed above.)  She reports not having anyone to address her marital concerns since her aunt, her cousin Bonita Quin, and a few close friends passed.  I suggested that she consider beginning to develop some additional natural supports.  She is volunteering at [employer] and she likes this very much.  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  Suicidal/Homicidal: No  Plan: Return again in 2-3 weeks.  Diagnosis: Axis I: Major depressive disorder    Axis II: No diagnosis    Salley Scarlet, [facility] 10/03/2012

## 2012-10-06 ENCOUNTER — Other Ambulatory Visit: Payer: Self-pay | Admitting: *Deleted

## 2012-10-06 MED ORDER — MELOXICAM 15 MG PO TABS: 15.0000 mg | ORAL_TABLET | Freq: Every day | ORAL | Status: DC | PRN

## 2012-10-24 ENCOUNTER — Telehealth: Payer: Self-pay | Admitting: *Deleted

## 2012-10-24 NOTE — Telephone Encounter (Signed)
Pt called stating her BP has running in the 140s and wanted to know if she should restart the HCTZ. I didn't see HCTZ on current med list so I advised Pt to schedule f/u w/ Dr. Thurmond Butts next week to eval BP.

## 2012-10-28 ENCOUNTER — Encounter: Payer: Self-pay | Admitting: Family Medicine

## 2012-10-28 ENCOUNTER — Ambulatory Visit (INDEPENDENT_AMBULATORY_CARE_PROVIDER_SITE_OTHER): Payer: BC Managed Care – PPO | Admitting: Psychology

## 2012-10-28 ENCOUNTER — Ambulatory Visit (INDEPENDENT_AMBULATORY_CARE_PROVIDER_SITE_OTHER): Payer: BC Managed Care – PPO | Admitting: Family Medicine

## 2012-10-28 ENCOUNTER — Encounter (HOSPITAL_COMMUNITY): Payer: Self-pay | Admitting: Psychology

## 2012-10-28 VITALS — BP 149/85 | HR 97 | Ht 64.0 in | Wt 225.0 lb

## 2012-10-28 DIAGNOSIS — F329 Major depressive disorder, single episode, unspecified: Secondary | ICD-10-CM

## 2012-10-28 DIAGNOSIS — F3289 Other specified depressive episodes: Secondary | ICD-10-CM

## 2012-10-28 DIAGNOSIS — R609 Edema, unspecified: Secondary | ICD-10-CM

## 2012-10-28 DIAGNOSIS — F411 Generalized anxiety disorder: Secondary | ICD-10-CM

## 2012-10-28 DIAGNOSIS — F419 Anxiety disorder, unspecified: Secondary | ICD-10-CM

## 2012-10-28 DIAGNOSIS — I10 Essential (primary) hypertension: Secondary | ICD-10-CM

## 2012-10-28 MED ORDER — LISINOPRIL-HYDROCHLOROTHIAZIDE 20-25 MG PO TABS
0.5000 | ORAL_TABLET | Freq: Every day | ORAL | Status: DC
Start: 2012-10-28 — End: 2013-10-19

## 2012-10-28 MED ORDER — LISINOPRIL-HYDROCHLOROTHIAZIDE 10-12.5 MG PO TABS: 1.0000 | ORAL_TABLET | Freq: Every day | ORAL | Status: DC

## 2012-10-28 NOTE — Progress Notes (Signed)
  Subjective:    Patient ID: Sara [name], female    DOB: 1951/11/13, 61 y.o.   MRN: [id number]  HPI #1 edema./Hypertension. Patient had increased swelling and edema. In reviewing her medications she's been off her lisinopril HCTZ. There are two entries for this medication one at 20/25 and the other for 10/12.5. She still taking her Demadex every other day as needed for swelling with potassium and she's using Toprol-XL 100 mg one tablet a day. She reports no problems with arrhythmia or shortness of breath or chest pain. She has noted her blood pressure has been staying up and when she went to see her dentist her blood pressure was elevated. #2 anxiety/situational disturbance. She reports relaxation methods have been working well for her. She still living in the same household as her husband and continues to live as housemates versus husband and wife. She states that she's having the stresses quite well.   Review of Systems  Cardiovascular: Positive for leg swelling.  Musculoskeletal: Positive for joint swelling.  Psychiatric/Behavioral: The patient is nervous/anxious.        Improved anxiety      BP 149/85  Pulse 97  Ht 5\' 4"  (1.626 m)  Wt 225 lb (102.059 kg)  BMI 38.62 kg/m2  SpO2 96% Objective:   Physical Exam  Vitals reviewed. Constitutional: She is oriented to [name], place, and time. She appears well-developed and well-nourished.  HENT:  Head: Normocephalic.  Neck: Normal range of motion. Neck supple.  Cardiovascular: Regular rhythm.   Pulmonary/Chest: Breath sounds normal.  Musculoskeletal: She exhibits edema.       Trace to 1+ edema lower extremities  Neurological: She is alert and oriented to [name], place, and time.  Skin: Skin is warm and dry.  Psychiatric: She has a normal mood and affect. Her behavior is normal.      Assessment & Plan:  #1 hypertension/edema. We will restart her on the higher dose of lisinopril HCTZ but have her only take a half tablet of that and  the Toprol-XL. Will see how she does in the next 5 weeks before make any other adjustments at this time. Also work to change her scripts to 90 days.  #2 anxiety and depression/situation disturbance. Continue working with counseling and continue with encouragement.

## 2012-10-28 NOTE — Progress Notes (Signed)
   THERAPIST PROGRESS NOTE  Session Time: 1058- 1114 am  Participation Level: Active  Behavioral Response: Neat and Well GroomedAlertEuthymic  Type of Therapy: Individual Therapy  Treatment Goals addressed: Communication: in relationships, thoughts and feelings and Coping  Interventions: Solution Focused, Strength-based, Psychosocial Skills: communicating needs, coping and Supportive  Summary: Sara [name] is a 61 y.o. female who presents as pleasant and easily engaged. She reports she is doing great and that if she appears somewhat shaky that she had provided comfort to a grieving person in the hall and requested assistance from her therapist to assist her.  The patient reports she thinks she has done the work to get her to a good place.  She feels capable of setting boundaries, taking care of her needs, and of speaking up for herself.  She has accepted that her husband might not agree with her but that she knows what she will accept and not accept.  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  She thinks she is ready to be discharged and I agree.  We discussed how she can schedule appointments as needed and when the appropriate time to seek additional support would be.  Suicidal/Homicidal: No  Plan: Return as needed.  Diagnosis: Axis I: Major depressive disorder, anxiety    Axis II: No diagnosis    Salley Scarlet, [facility] 10/28/2012

## 2012-10-28 NOTE — Patient Instructions (Signed)
Peripheral Edema  You have swelling in your legs (peripheral edema). This swelling is due to excess accumulation of salt and water in your body. Edema may be a sign of heart, kidney or liver disease, or a side effect of a medication. It may also be due to problems in the leg veins. Elevating your legs and using special support stockings may be very helpful, if the cause of the swelling is due to poor venous circulation. Avoid long periods of standing, whatever the cause.  Treatment of edema depends on identifying the cause. Chips, pretzels, pickles and other salty foods should be avoided. Restricting salt in your diet is almost always needed. Water pills (diuretics) are often used to remove the excess salt and water from your body via urine. These medicines prevent the kidney from reabsorbing sodium. This increases urine flow.  Diuretic treatment may also result in lowering of potassium levels in your body. Potassium supplements may be needed if you have to use diuretics daily. Daily weights can help you keep track of your progress in clearing your edema. You should call your caregiver for follow up care as recommended.  SEEK IMMEDIATE MEDICAL CARE IF:    You have increased swelling, pain, redness, or heat in your legs.   You develop shortness of breath, especially when lying down.   You develop chest or abdominal pain, weakness, or fainting.   You have a fever.  Document Released: 01/24/2005 Document Revised: 03/10/2012 Document Reviewed: 01/04/2010  ExitCare Patient Information 2013 [employer].

## 2012-11-13 ENCOUNTER — Telehealth: Payer: Self-pay

## 2012-11-13 NOTE — Telephone Encounter (Signed)
Roxana wants to know if Dr Thurmond Butts thinks she should start cholesterol medication based on her last lab work.

## 2012-11-13 NOTE — Telephone Encounter (Signed)
You may inform her that even though her LDL cholesterol was higher than 100 her total cholesterol was under 200, her HDL is good in the 50s, and her triglycerides were normal. This meant that the room HDL/cholesterol ratio is under 4 so while I want her to be aware of her cholesterol so that she can modify her diet at this time unless the guidelines change I would not put her on cholesterol meds. If she wants to take a good fish oil I would have no problem with that and usually I recommend the Nutralite Fish Oil that I would be glad to send her information if she would like if she does not have return appointment before I leave.

## 2012-11-13 NOTE — Telephone Encounter (Signed)
Left message explaining Dr Warden Fillers recommendation.

## 2012-11-17 ENCOUNTER — Other Ambulatory Visit: Payer: Self-pay | Admitting: Cardiology

## 2012-12-09 ENCOUNTER — Encounter: Payer: Self-pay | Admitting: Family Medicine

## 2012-12-09 ENCOUNTER — Ambulatory Visit (INDEPENDENT_AMBULATORY_CARE_PROVIDER_SITE_OTHER): Payer: BC Managed Care – PPO | Admitting: Family Medicine

## 2012-12-09 ENCOUNTER — Encounter: Payer: Self-pay | Admitting: *Deleted

## 2012-12-09 VITALS — BP 116/77 | HR 81 | Ht 65.0 in | Wt 220.0 lb

## 2012-12-09 DIAGNOSIS — R609 Edema, unspecified: Secondary | ICD-10-CM

## 2012-12-09 DIAGNOSIS — Z136 Encounter for screening for cardiovascular disorders: Secondary | ICD-10-CM

## 2012-12-09 DIAGNOSIS — G4720 Circadian rhythm sleep disorder, unspecified type: Secondary | ICD-10-CM

## 2012-12-09 DIAGNOSIS — Z0000 Encounter for general adult medical examination without abnormal findings: Secondary | ICD-10-CM

## 2012-12-09 DIAGNOSIS — I499 Cardiac arrhythmia, unspecified: Secondary | ICD-10-CM

## 2012-12-09 DIAGNOSIS — R079 Chest pain, unspecified: Secondary | ICD-10-CM

## 2012-12-09 DIAGNOSIS — I10 Essential (primary) hypertension: Secondary | ICD-10-CM

## 2012-12-09 LAB — COMPREHENSIVE METABOLIC PANEL
ALT: 10 U/L (ref 0–35)
AST: 18 U/L (ref 0–37)
Albumin: 4.6 g/dL (ref 3.5–5.2)
CO2: 32 mEq/L (ref 19–32)
Calcium: 9.7 mg/dL (ref 8.4–10.5)
Chloride: 98 mEq/L (ref 96–112)
Creat: 1.18 mg/dL — ABNORMAL HIGH (ref 0.50–1.10)
Potassium: 3.6 mEq/L (ref 3.5–5.3)
Sodium: 140 mEq/L (ref 135–145)
Total Protein: 7.2 g/dL (ref 6.0–8.3)

## 2012-12-09 LAB — CBC WITH DIFFERENTIAL/PLATELET
Basophils Relative: 0 % (ref 0–1)
Eosinophils Absolute: 0.1 10*3/uL (ref 0.0–0.7)
Eosinophils Relative: 1 % (ref 0–5)
Lymphs Abs: 3.8 10*3/uL (ref 0.7–4.0)
MCH: 31.3 pg (ref 26.0–34.0)
MCHC: 34.5 g/dL (ref 30.0–36.0)
MCV: 90.8 fL (ref 78.0–100.0)
Platelets: 297 10*3/uL (ref 150–400)
RBC: 4.47 MIL/uL (ref 3.87–5.11)

## 2012-12-09 LAB — POCT URINALYSIS DIPSTICK
Leukocytes, UA: NEGATIVE
Nitrite, UA: NEGATIVE
Protein, UA: NEGATIVE
Urobilinogen, UA: 0.2

## 2012-12-09 LAB — LIPID PANEL
Total CHOL/HDL Ratio: 3.4 Ratio
VLDL: 17 mg/dL (ref 0–40)

## 2012-12-09 MED ORDER — OMEPRAZOLE 40 MG PO CPDR: 40.0000 mg | DELAYED_RELEASE_CAPSULE | Freq: Every day | ORAL | Status: DC

## 2012-12-09 MED ORDER — MOMETASONE FUROATE 50 MCG/ACT NA SUSP: 2.0000 | NASAL | Status: DC | PRN

## 2012-12-09 MED ORDER — TRAZODONE HCL 150 MG PO TABS: 75.0000 mg | ORAL_TABLET | Freq: Every day | ORAL | Status: DC

## 2012-12-09 MED ORDER — VITAMIN D (ERGOCALCIFEROL) 1.25 MG (50000 UNIT) PO CAPS: 50000.0000 [IU] | ORAL_CAPSULE | Freq: Every day | ORAL | Status: DC

## 2012-12-09 MED ORDER — POTASSIUM CHLORIDE CRYS ER 20 MEQ PO TBCR: 20.0000 meq | EXTENDED_RELEASE_TABLET | Freq: Every day | ORAL | Status: DC

## 2012-12-09 MED ORDER — MONTELUKAST SODIUM 10 MG PO TABS: 10.0000 mg | ORAL_TABLET | Freq: Every day | ORAL | Status: DC

## 2012-12-09 MED ORDER — ZOLPIDEM TARTRATE 10 MG PO TABS: 10.0000 mg | ORAL_TABLET | Freq: Every evening | ORAL | Status: DC | PRN

## 2012-12-09 NOTE — Progress Notes (Signed)
Subjective:    Patient ID: Sara [name], female    DOB: 08-31-1951, 61 y.o.   MRN: [id number]  HPI Patient's here for yearly exam.   Review of Systems  Musculoskeletal: Positive for myalgias.  Psychiatric/Behavioral: Positive for sleep disturbance. The patient is not nervous/anxious.   All other systems reviewed and are negative.   she does also have a history of edema her anxiety and depression doing much better, sleep disturbance has improved and she is to have a colonoscopy and mammogram in the very next future.  No Known Allergies History   Social History  . Marital Status: Married    Spouse Name: N/A    Number of Children: 4  . Years of Education: N/A   Occupational History  . 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   Social History Main Topics  . Smoking status: Never Smoker   . Smokeless tobacco: Never Used  . Alcohol Use: Yes     Comment: Occasional-Last had a drink a month ago  . Drug Use: No  . Sexually Active: No   Other Topics Concern  . Not on file   Social History Narrative   Married to [place]. Has grown children, daughter in [place].Walks for exercise.   Family History  Problem Relation Age of Onset  . Diabetes Mother   . Heart attack Mother     First MI at age 54  . Diabetes type II Mother   . Coronary artery disease Mother   . Hypertension Father   . Diabetes Father   . Alcohol abuse Father   . Pancreatitis Father   . Asthma Daughter   . Asthma Grandchild    Past Medical History  Diagnosis Date  . Hypertension   . Postmenopausal bleeding   . Fibromyalgia     [name]  . Asthma   . Hyperlipidemia    Past Surgical History  Procedure Date  . Total knee arthroplasty 2000    Right, Dr. Shelle Iron  . Breast biopsy 1980    neg   ,    BP 116/77  Pulse 81  Ht 5\' 5"  (1.651 m)  Wt 220 lb (99.791 kg)  BMI 36.61 kg/m2 Objective:   Physical Exam  Vitals reviewed. Constitutional: She is oriented to [name], place, and time. She  appears well-developed and well-nourished.  HENT:  Head: Normocephalic and atraumatic.  Right Ear: External ear normal.  Left Ear: External ear normal.  Nose: Nose normal.  Eyes: Conjunctivae normal and EOM are normal. Pupils are equal, round, and reactive to light. Right eye exhibits no chemosis and no discharge. Left eye exhibits no chemosis and no discharge. Right conjunctiva is not injected. Left conjunctiva is not injected.  Fundoscopic exam:      The right eye shows no arteriolar narrowing, no AV nicking and no exudate.       The left eye shows no arteriolar narrowing, no AV nicking and no exudate.  Neck: Normal range of motion. Neck supple. No tracheal deviation present. No thyromegaly present.  Cardiovascular: Normal rate, regular rhythm, normal heart sounds and intact distal pulses.   No murmur heard. Pulmonary/Chest: Effort normal and breath sounds normal. No respiratory distress. She has no wheezes.  Abdominal: Soft. Bowel sounds are normal. She exhibits no distension. There is no tenderness.  Genitourinary: No breast swelling, tenderness, discharge or bleeding.  Musculoskeletal: Normal range of motion.  Neurological: She is alert and oriented to [name], place, and time. No cranial nerve deficit.  Skin: Skin is warm and dry. She is not diaphoretic. No erythema.  Psychiatric: She has a normal mood and affect.   EKG normal sinus rhythm     Results for orders placed in visit on 12/09/12  POCT URINALYSIS DIPSTICK      Component Value Range   Color, UA yellow     Clarity, UA clear     Glucose, UA neg     Bilirubin, UA neg     Ketones, UA neg     Spec Grav, UA 1.015     Blood, UA trace-lysed     pH, UA 6.0     Protein, UA neg     Urobilinogen, UA 0.2     Nitrite, UA neg     Leukocytes, UA Negative     Assessment & Plan:   #1 health maintenance. Will obtain routine labs CBC CMP A1c TSH and lipids. She's had an abnormal CPK before in the past so we'll get a CPK and  MB Because of her history of edema we'll check a BMP.  Medications reviewed and renewed  Return in 4 months followup Dr. Karie Schwalbe.

## 2012-12-09 NOTE — Patient Instructions (Signed)

## 2012-12-10 LAB — CK TOTAL AND CKMB (NOT AT ARMC)
CK, MB: 1.7 ng/mL (ref 0.3–4.0)
Total CK: 141 U/L (ref 7–177)

## 2012-12-11 ENCOUNTER — Encounter: Payer: Self-pay | Admitting: Family Medicine

## 2012-12-16 ENCOUNTER — Telehealth: Payer: Self-pay | Admitting: *Deleted

## 2012-12-16 NOTE — Telephone Encounter (Signed)
Pt calls and states that you will be her new doc once Dr. Thurmond Butts leaves and would like for you to call her in regards to her recent labs

## 2012-12-16 NOTE — Telephone Encounter (Signed)
Pls let her know that her labs looked good, LDL was high and needs to be worked on, we should definitely discuss adding a cholesterol medication.

## 2012-12-16 NOTE — Telephone Encounter (Signed)
Pt notified and will make appt for next week to meet you and discuss

## 2012-12-22 ENCOUNTER — Ambulatory Visit (INDEPENDENT_AMBULATORY_CARE_PROVIDER_SITE_OTHER): Payer: BC Managed Care – PPO | Admitting: Sports Medicine

## 2012-12-22 ENCOUNTER — Encounter: Payer: Self-pay | Admitting: Sports Medicine

## 2012-12-22 ENCOUNTER — Other Ambulatory Visit: Payer: Self-pay | Admitting: Sports Medicine

## 2012-12-22 ENCOUNTER — Ambulatory Visit (INDEPENDENT_AMBULATORY_CARE_PROVIDER_SITE_OTHER): Payer: BC Managed Care – PPO

## 2012-12-22 VITALS — BP 94/67 | HR 72 | Wt 221.0 lb

## 2012-12-22 DIAGNOSIS — T8484XA Pain due to internal orthopedic prosthetic devices, implants and grafts, initial encounter: Secondary | ICD-10-CM

## 2012-12-22 DIAGNOSIS — Z96652 Presence of left artificial knee joint: Secondary | ICD-10-CM

## 2012-12-22 DIAGNOSIS — I10 Essential (primary) hypertension: Secondary | ICD-10-CM

## 2012-12-22 DIAGNOSIS — E785 Hyperlipidemia, unspecified: Secondary | ICD-10-CM

## 2012-12-22 DIAGNOSIS — R7309 Other abnormal glucose: Secondary | ICD-10-CM

## 2012-12-22 DIAGNOSIS — Z96659 Presence of unspecified artificial knee joint: Secondary | ICD-10-CM

## 2012-12-22 DIAGNOSIS — T8489XA Other specified complication of internal orthopedic prosthetic devices, implants and grafts, initial encounter: Secondary | ICD-10-CM

## 2012-12-22 DIAGNOSIS — R7303 Prediabetes: Secondary | ICD-10-CM | POA: Insufficient documentation

## 2012-12-22 DIAGNOSIS — M25569 Pain in unspecified knee: Secondary | ICD-10-CM

## 2012-12-22 NOTE — Assessment & Plan Note (Signed)
I will have her see a nutritionist. We will recheck a hemoglobin A1c in 3 months.

## 2012-12-22 NOTE — Assessment & Plan Note (Signed)
I would also like her to see a nutritionist regarding this. I can recheck it in 3 months. If not at control, we can add a statin medication.

## 2012-12-22 NOTE — Patient Instructions (Addendum)
Initial Hamstring Rehab Protocol Hamstring curls: Start with 3 sets of 15 (no weight); Progress by 5 reps every 3 days until you reach 3 sets of 30; After 3 days at 3 sets of 30, add 2lb ankle weight at 3 sets of 10; Increase every 5 days by 5 reps. You may add 2lbs ankle weight once weekly. Hamstring swings- swing leg backwards and curl at the end of the swing. Follow same schedule as above. Hamstring running lunges- running lunge position means no more than 45 degrees of knee flexion and running motion. Follow same schedule as above. 

## 2012-12-22 NOTE — Progress Notes (Signed)
Subjective:    CC: Followup  HPI: Hypertension: Blood pressure is currently low, there has been no recent changes in her medications. Her BUN/creatinine was slightly elevated, with an elevated ratio of greater than 20 suggestive of a prerenal etiology. She does understand she is to increase her oral fluid intake.  Hyperlipidemia:Slightly elevated, she would like a nutritionist visit.  Left knee pain: Status post total knee arthroplasty approximately 15 years ago, and his posterior medial, she does get some instability. She denies any swelling, or constitutional symptoms.  Past medical history, Surgical history, Family history, Social history, Allergies, and medications have been entered into the medical record, reviewed, and no changes needed.   Review of Systems: No fevers, chills, night sweats, weight loss, chest pain, or shortness of breath.   Objective:    General: Well Developed, well nourished, and in no acute distress.  Neuro: Alert and oriented x3, extra-ocular muscles intact.  HEENT: Normocephalic, atraumatic, pupils equal round reactive to light, neck supple, no masses, no lymphadenopathy, thyroid nonpalpable.  Skin: Warm and dry, no rashes. Cardiac: Regular rate and rhythm, no murmurs rubs or gallops.  Respiratory: Clear to auscultation bilaterally. Not using accessory muscles, speaking in full sentences. Left Knee: Normal to inspection with no erythema or effusion or obvious bony abnormalities. Well-healed arthroplasty scar. Palpation normal with no warmth, joint line tenderness, patellar tenderness, or condyle tenderness. ROM full in flexion and extension and lower leg rotation. Ligaments with solid consistent endpoints including ACL, PCL, LCL, MCL. Negative Mcmurray's, Apley's, and Thessalonian tests. Non painful patellar compression. Patellar glide without crepitus. Patellar and quadriceps tendons unremarkable. Reproduction of pain with resisted knee flexion, suspicious  of hamstring strain.   Impression and Recommendations:

## 2012-12-22 NOTE — Assessment & Plan Note (Signed)
The knee replacement is approximately 59 years old. This is nearing its lifespan. I would like x-rays to assess for loosening of the components, and asked her pain is most related clinically to firing of the semitendinosus muscle, I would like her to do some home hamstring rehabilitation. I will see her back in a few weeks for this, and if no better we should get a little more aggressive in terms of workup for loosening of the hardware, this would mean bone scan.

## 2012-12-22 NOTE — Assessment & Plan Note (Signed)
Blood pressure slightly low today. No changes. We will recheck this in 3 months.

## 2012-12-29 ENCOUNTER — Telehealth: Payer: Self-pay | Admitting: *Deleted

## 2012-12-29 NOTE — Telephone Encounter (Signed)
Pt called and wanted to know the results of her knee xrays. Pt also states she would like a referral to a neurologist because she said she feels she may have "pinched nerves that is causing her pain."

## 2012-12-29 NOTE — Telephone Encounter (Signed)
Knee x-ray show minimal lucency on the anterior aspect of the femoral prosthesis. For now continue with previously agreed upon rehabilitation and f/u in 4 weeks if still painful. Neurologist may not be the best specialist to discuss to pinched nerves. She should probably come to see me (Sports Med/Orthopaedics) to determine if this could be coming from her lumbar spine (the most likely cause).

## 2012-12-30 NOTE — Telephone Encounter (Signed)
Pt notified and she states she sees Dr. Ethelene Hal for her spine so she will talk to him about the nerve pain

## 2013-01-20 ENCOUNTER — Ambulatory Visit: Payer: Self-pay | Admitting: *Deleted

## 2013-01-23 ENCOUNTER — Ambulatory Visit (INDEPENDENT_AMBULATORY_CARE_PROVIDER_SITE_OTHER): Payer: BC Managed Care – PPO | Admitting: Sports Medicine

## 2013-01-23 DIAGNOSIS — M79605 Pain in left leg: Secondary | ICD-10-CM

## 2013-01-23 DIAGNOSIS — Z96659 Presence of unspecified artificial knee joint: Secondary | ICD-10-CM

## 2013-01-23 DIAGNOSIS — L989 Disorder of the skin and subcutaneous tissue, unspecified: Secondary | ICD-10-CM | POA: Insufficient documentation

## 2013-01-23 DIAGNOSIS — T8489XA Other specified complication of internal orthopedic prosthetic devices, implants and grafts, initial encounter: Secondary | ICD-10-CM

## 2013-01-23 DIAGNOSIS — M545 Low back pain, unspecified: Secondary | ICD-10-CM

## 2013-01-23 DIAGNOSIS — T8484XA Pain due to internal orthopedic prosthetic devices, implants and grafts, initial encounter: Secondary | ICD-10-CM

## 2013-01-23 DIAGNOSIS — M47816 Spondylosis without myelopathy or radiculopathy, lumbar region: Secondary | ICD-10-CM | POA: Insufficient documentation

## 2013-01-23 NOTE — Assessment & Plan Note (Addendum)
No better with home rehabilitation, still with a stabbing pain at the medial and lateral joint lines. Arthroplasty prosthesis is 60 years old. At this point we'll proceed with bone scan to assess for component loosening.  I also recommended that she try capsaicin, if we receive any Voltaren samples of happy to provide them as these have worked as well.

## 2013-01-23 NOTE — Assessment & Plan Note (Signed)
Symptoms likely represent left lumbar radiculitis and a possible L5 distribution. I would like to get records from recent MRI, and epidural steroid injections. We'll try to see if we can get these cheaper through [facility] imaging.

## 2013-01-23 NOTE — Progress Notes (Signed)
Subjective:    CC: Followup  HPI: Left knee pain: She is status post total knee arthroplasty on the left side approximately 15 years ago, unfortunately she continues to have pain. I was able to reproduce the pain at the last visit with firing of the semitendinosus muscle, I had her do some home rehabilitation exercises, and she is not improved. She gets occasional swelling, but no constitutional symptoms.  Back pain radiating down left leg: This sounds like a left L5 radiculitis. She has been getting epidural steroid injections by Dr. Ethelene Hal, these work for at least 2 months. Unfortunately she has a very high co-pay of approximately $85. She is wondering if I can take over. He also prescribed fentanyl patches as well as Norco, she's wondering if I can take over the narcotic pain management as well.  Skin lesion: Has been present for a long time, on the back, interferes with bra strap and bleeds.  Past medical history, Surgical history, Family history not pertinant except as noted below, Social history, Allergies, and medications have been entered into the medical record, reviewed, and no changes needed.   Review of Systems: No fevers, chills, night sweats, weight loss, chest pain, or shortness of breath.   Objective:    General: Well Developed, well nourished, and in no acute distress.  Neuro: Alert and oriented x3, extra-ocular muscles intact, sensation grossly intact.  HEENT: Normocephalic, atraumatic, pupils equal round reactive to light, neck supple, no masses, no lymphadenopathy, thyroid nonpalpable.  Skin: Warm and dry, no rashes.  There is a small nevus that is somewhat raised, approximately 2 mm across, well-defined border, non-variegated, nontender.  Cardiac: Regular rate and rhythm, no murmurs rubs or gallops.  Respiratory: Clear to auscultation bilaterally. Not using accessory muscles, speaking in full sentences.  Procedure: Destruction of benign nevus.  Risks, benefits, and  alternatives explained and consent obtained. Time out conducted. Cryotherapy was performed to destruct this lesion. Pt stable. Pt advised to call or RTC for continued bleeding, spreading erythema/induration, fevers, or chills.  Impression and Recommendations:

## 2013-01-23 NOTE — Assessment & Plan Note (Signed)
Cryotherapy performed for likely benign nevus, friable.

## 2013-01-27 ENCOUNTER — Other Ambulatory Visit: Payer: Self-pay | Admitting: Sports Medicine

## 2013-01-27 DIAGNOSIS — M79605 Pain in left leg: Secondary | ICD-10-CM

## 2013-01-28 ENCOUNTER — Encounter: Payer: Self-pay | Admitting: Sports Medicine

## 2013-02-19 ENCOUNTER — Encounter (HOSPITAL_COMMUNITY): Payer: BC Managed Care – PPO | Attending: Sports Medicine

## 2013-02-19 ENCOUNTER — Ambulatory Visit (INDEPENDENT_AMBULATORY_CARE_PROVIDER_SITE_OTHER): Payer: BC Managed Care – PPO | Admitting: Sports Medicine

## 2013-02-19 ENCOUNTER — Encounter: Payer: Self-pay | Admitting: Sports Medicine

## 2013-02-19 ENCOUNTER — Encounter (HOSPITAL_COMMUNITY): Payer: BC Managed Care – PPO

## 2013-02-19 ENCOUNTER — Ambulatory Visit (INDEPENDENT_AMBULATORY_CARE_PROVIDER_SITE_OTHER): Payer: BC Managed Care – PPO

## 2013-02-19 VITALS — BP 113/72 | HR 70 | Temp 98.1°F | Wt 221.0 lb

## 2013-02-19 DIAGNOSIS — J45909 Unspecified asthma, uncomplicated: Secondary | ICD-10-CM

## 2013-02-19 DIAGNOSIS — K219 Gastro-esophageal reflux disease without esophagitis: Secondary | ICD-10-CM

## 2013-02-19 DIAGNOSIS — R05 Cough: Secondary | ICD-10-CM

## 2013-02-19 DIAGNOSIS — N39498 Other specified urinary incontinence: Secondary | ICD-10-CM

## 2013-02-19 DIAGNOSIS — R059 Cough, unspecified: Secondary | ICD-10-CM

## 2013-02-19 MED ORDER — AZITHROMYCIN 250 MG PO TABS: ORAL_TABLET | ORAL | Status: DC

## 2013-02-19 MED ORDER — MOMETASONE FUROATE 50 MCG/ACT NA SUSP: 2.0000 | NASAL | Status: DC | PRN

## 2013-02-19 MED ORDER — MELOXICAM 15 MG PO TABS: 15.0000 mg | ORAL_TABLET | Freq: Every day | ORAL | Status: DC | PRN

## 2013-02-19 MED ORDER — ALBUTEROL SULFATE (2.5 MG/3ML) 0.083% IN NEBU: 2.5000 mg | INHALATION_SOLUTION | RESPIRATORY_TRACT | Status: DC | PRN

## 2013-02-19 MED ORDER — FLUTICASONE FUROATE-VILANTEROL 100-25 MCG/INH IN AEPB: 1.0000 | INHALATION_SPRAY | Freq: Every day | RESPIRATORY_TRACT | Status: DC

## 2013-02-19 MED ORDER — ALBUTEROL SULFATE HFA 108 (90 BASE) MCG/ACT IN AERS: 2.0000 | INHALATION_SPRAY | RESPIRATORY_TRACT | Status: DC | PRN

## 2013-02-19 MED ORDER — PREDNISONE 50 MG PO TABS: 50.0000 mg | ORAL_TABLET | Freq: Every day | ORAL | Status: DC

## 2013-02-19 NOTE — Progress Notes (Signed)
Subjective:    CC: Followup  HPI: Sara [name] is a very pleasant 62 year old female with a history of asthma who comes in with increasing shortness of breath, coughing productive of sputum, and wheezing. Her current regimen includes Singulair, Qvar 80 which she has been using as a rescue inhaler, and albuterol which she has been out of for some time. She denies any chest pain. She denies any fevers, chills, GI symptoms, or rashes.  Past medical history, Surgical history, Family history not pertinant except as noted below, Social history, Allergies, and medications have been entered into the medical record, reviewed, and no changes needed.   Review of Systems: No fevers, chills, night sweats, weight loss, chest pain.   Objective:    General: Well Developed, well nourished, and in no acute distress.  Neuro: Alert and oriented x3, extra-ocular muscles intact, sensation grossly intact.  HEENT: Normocephalic, atraumatic, pupils equal round reactive to light, neck supple, no masses, no lymphadenopathy, thyroid nonpalpable.  Skin: Warm and dry, no rashes. Cardiac: Regular rate and rhythm, no murmurs rubs or gallops.  Respiratory: Coarse sounds bilaterally, no wheezes. Not using accessory muscles, speaking in full sentences.  Chest x-ray is negative for infiltrate. Impression and Recommendations:

## 2013-02-19 NOTE — Assessment & Plan Note (Signed)
Currently in exacerbation. Chest x-ray, prednisone, azithromycin. I'm also going to escalate her therapy to inhaled corticosteroids/long acting bronchodilator.  We will use Breo. I'm also going to refill her albuterol inhaler as well as nebulizer. I would like to see her back in about a month.

## 2013-03-12 ENCOUNTER — Ambulatory Visit: Payer: Self-pay | Admitting: Sports Medicine

## 2013-03-19 ENCOUNTER — Other Ambulatory Visit: Payer: Self-pay | Admitting: *Deleted

## 2013-03-19 DIAGNOSIS — G4720 Circadian rhythm sleep disorder, unspecified type: Secondary | ICD-10-CM

## 2013-03-19 MED ORDER — ZOLPIDEM TARTRATE 10 MG PO TABS: 10.0000 mg | ORAL_TABLET | Freq: Every evening | ORAL | Status: DC | PRN

## 2013-04-09 ENCOUNTER — Ambulatory Visit: Payer: Self-pay | Admitting: Sports Medicine

## 2013-04-15 ENCOUNTER — Ambulatory Visit: Payer: Self-pay | Admitting: Sports Medicine

## 2013-04-21 ENCOUNTER — Other Ambulatory Visit (HOSPITAL_COMMUNITY): Payer: Self-pay | Admitting: Specialist

## 2013-04-21 DIAGNOSIS — M25562 Pain in left knee: Secondary | ICD-10-CM

## 2013-04-28 ENCOUNTER — Encounter (HOSPITAL_COMMUNITY): Payer: BC Managed Care – PPO

## 2013-05-01 ENCOUNTER — Telehealth: Payer: Self-pay | Admitting: *Deleted

## 2013-05-01 ENCOUNTER — Other Ambulatory Visit: Payer: Self-pay | Admitting: Sports Medicine

## 2013-05-01 DIAGNOSIS — F419 Anxiety disorder, unspecified: Secondary | ICD-10-CM

## 2013-05-01 DIAGNOSIS — F430 Acute stress reaction: Secondary | ICD-10-CM

## 2013-05-01 MED ORDER — CLONAZEPAM 1 MG PO TABS: 1.0000 mg | ORAL_TABLET | Freq: Two times a day (BID) | ORAL | Status: DC | PRN

## 2013-05-01 NOTE — Telephone Encounter (Signed)
Pt calls & is asking for a refill on her clonazepam.  Dr Thurmond Butts was giving her this for her heart palpitations.  Are you ok with this?  Please advise

## 2013-05-01 NOTE — Telephone Encounter (Signed)
Yes, refill in my box.

## 2013-05-09 ENCOUNTER — Other Ambulatory Visit: Payer: Self-pay | Admitting: Family Medicine

## 2013-06-03 ENCOUNTER — Ambulatory Visit (INDEPENDENT_AMBULATORY_CARE_PROVIDER_SITE_OTHER): Payer: BC Managed Care – PPO | Admitting: Sports Medicine

## 2013-06-03 VITALS — BP 117/81 | HR 75 | Wt 224.0 lb

## 2013-06-03 DIAGNOSIS — I10 Essential (primary) hypertension: Secondary | ICD-10-CM

## 2013-06-03 DIAGNOSIS — Z299 Encounter for prophylactic measures, unspecified: Secondary | ICD-10-CM

## 2013-06-03 DIAGNOSIS — E785 Hyperlipidemia, unspecified: Secondary | ICD-10-CM

## 2013-06-03 DIAGNOSIS — T8484XD Pain due to internal orthopedic prosthetic devices, implants and grafts, subsequent encounter: Secondary | ICD-10-CM

## 2013-06-03 DIAGNOSIS — R7303 Prediabetes: Secondary | ICD-10-CM

## 2013-06-03 DIAGNOSIS — Z0000 Encounter for general adult medical examination without abnormal findings: Secondary | ICD-10-CM

## 2013-06-03 DIAGNOSIS — J3089 Other allergic rhinitis: Secondary | ICD-10-CM | POA: Insufficient documentation

## 2013-06-03 DIAGNOSIS — J309 Allergic rhinitis, unspecified: Secondary | ICD-10-CM

## 2013-06-03 LAB — COMPREHENSIVE METABOLIC PANEL WITH GFR
Albumin: 4.6 g/dL (ref 3.5–5.2)
Alkaline Phosphatase: 54 U/L (ref 39–117)
BUN: 21 mg/dL (ref 6–23)
Calcium: 9.5 mg/dL (ref 8.4–10.5)
Chloride: 100 meq/L (ref 96–112)
Glucose, Bld: 107 mg/dL — ABNORMAL HIGH (ref 70–99)
Potassium: 3.7 meq/L (ref 3.5–5.3)
Sodium: 140 meq/L (ref 135–145)
Total Protein: 7.1 g/dL (ref 6.0–8.3)

## 2013-06-03 LAB — COMPREHENSIVE METABOLIC PANEL
ALT: 15 U/L (ref 0–35)
AST: 18 U/L (ref 0–37)
CO2: 30 mEq/L (ref 19–32)
Creat: 1.11 mg/dL — ABNORMAL HIGH (ref 0.50–1.10)
Total Bilirubin: 0.7 mg/dL (ref 0.3–1.2)

## 2013-06-03 LAB — CBC
HCT: 39.2 % (ref 36.0–46.0)
Hemoglobin: 13.6 g/dL (ref 12.0–15.0)
MCH: 31.4 pg (ref 26.0–34.0)
MCHC: 34.7 g/dL (ref 30.0–36.0)
MCV: 90.5 fL (ref 78.0–100.0)
Platelets: 307 10*3/uL (ref 150–400)
RBC: 4.33 MIL/uL (ref 3.87–5.11)
RDW: 13 % (ref 11.5–15.5)
WBC: 10.1 K/uL (ref 4.0–10.5)

## 2013-06-03 LAB — HEMOGLOBIN A1C
Hgb A1c MFr Bld: 5.7 % — ABNORMAL HIGH (ref <5.7)
Hgb A1c MFr Bld: 5.7 % — ABNORMAL HIGH (ref <5.7)
Mean Plasma Glucose: 117 mg/dL — ABNORMAL HIGH (ref <117)
Mean Plasma Glucose: 117 mg/dL — ABNORMAL HIGH (ref <117)

## 2013-06-03 LAB — LIPID PANEL
Cholesterol: 186 mg/dL (ref 0–200)
HDL: 51 mg/dL (ref >39)
LDL Cholesterol: 120 mg/dL — ABNORMAL HIGH (ref 0–99)
Total CHOL/HDL Ratio: 3.6 Ratio
Triglycerides: 75 mg/dL (ref <150)
VLDL: 15 mg/dL (ref 0–40)

## 2013-06-03 LAB — TSH: TSH: 1.462 u[IU]/mL (ref 0.350–4.500)

## 2013-06-03 NOTE — Assessment & Plan Note (Signed)
Very well controlled. No changes.

## 2013-06-03 NOTE — Assessment & Plan Note (Signed)
Restart nasocort. Also start claritin OTC.

## 2013-06-03 NOTE — Assessment & Plan Note (Signed)
That were slightly elevated. Rechecking lipids.

## 2013-06-03 NOTE — Progress Notes (Addendum)
  Subjective:    CC: Complete physical  HPI:  Hypertension: Well controlled.  Hyperlipidemia: Moderate control, not on any medication.  Preventive measure: Up-to-date on all preventive measures.  Allergic rhinitis: Uses only Singulair, has not used any H1 blockers or intranasal steroids. Rhinorrhea through the day, sore throat at night. No reflux symptoms.  Pain at left TKA:  Getting set up for bone scan to look for loosening of the components.  Past medical history, Surgical history, Family history not pertinant except as noted below, Social history, Allergies, and medications have been entered into the medical record, reviewed, and no changes needed.   Review of Systems: No headache, visual changes, nausea, vomiting, diarrhea, constipation, dizziness, abdominal pain, skin rash, fevers, chills, night sweats, swollen lymph nodes, weight loss, chest pain, body aches, joint swelling, muscle aches, shortness of breath, mood changes, visual or auditory hallucinations.  Objective:    General: Well Developed, well nourished, and in no acute distress.  Neuro: Alert and oriented x3, extra-ocular muscles intact, sensation grossly intact.  HEENT: Normocephalic, atraumatic, pupils equal round reactive to light, neck supple, no masses, no lymphadenopathy, thyroid nonpalpable. External ear canals, oropharynx, nasopharynx are unremarkable. Skin: Warm and dry, no rashes noted.  Cardiac: Regular rate and rhythm, no murmurs rubs or gallops.  Respiratory: Clear to auscultation bilaterally. Not using accessory muscles, speaking in full sentences.  Abdominal: Soft, nontender, nondistended, positive bowel sounds, no masses, no organomegaly.  Musculoskeletal: Shoulder, elbow, wrist, hip, knee, ankle stable, and with full range of motion.  12-lead EKG shows normal sinus rhythm with normal axis, and no ST segment changes.  Impression and Recommendations:    The patient was counselled, risk factors were  discussed, anticipatory guidance given.

## 2013-06-03 NOTE — Assessment & Plan Note (Signed)
Checking A1c.

## 2013-06-03 NOTE — Addendum Note (Signed)
Addended by: Monica Becton on: 06/03/2013 10:08 AM   Modules accepted: Level of Service

## 2013-06-03 NOTE — Assessment & Plan Note (Signed)
Skin coming up with [facility] orthopedics to look for loosening of the prosthesis.

## 2013-06-03 NOTE — Assessment & Plan Note (Signed)
Checking routine bloodwork. Up to date on preventive measures.

## 2013-06-04 LAB — VITAMIN D 25 HYDROXY (VIT D DEFICIENCY, FRACTURES): Vit D, 25-Hydroxy: 60 ng/mL (ref 30–89)

## 2013-06-05 ENCOUNTER — Encounter: Payer: Self-pay | Admitting: *Deleted

## 2013-06-09 ENCOUNTER — Other Ambulatory Visit: Payer: Self-pay | Admitting: Sports Medicine

## 2013-06-09 MED ORDER — ATORVASTATIN CALCIUM 20 MG PO TABS: 20.0000 mg | ORAL_TABLET | Freq: Every day | ORAL | Status: DC

## 2013-07-10 ENCOUNTER — Other Ambulatory Visit: Payer: Self-pay | Admitting: Sports Medicine

## 2013-07-10 NOTE — Telephone Encounter (Signed)
Patient called and left a message on the voicemail stating she requested at refill on her Mobic be sent to [facility] in [place] a few days ago and she went to pick it up and it is not there.

## 2013-07-13 ENCOUNTER — Other Ambulatory Visit: Payer: Self-pay

## 2013-07-13 ENCOUNTER — Telehealth: Payer: Self-pay | Admitting: Sports Medicine

## 2013-07-13 MED ORDER — MELOXICAM 15 MG PO TABS: 15.0000 mg | ORAL_TABLET | Freq: Every day | ORAL | Status: DC | PRN

## 2013-07-13 NOTE — Telephone Encounter (Signed)
Done

## 2013-07-13 NOTE — Telephone Encounter (Signed)
Rx Mobic sent to [facility]. Rhonda Cunningham,CMA

## 2013-07-13 NOTE — Telephone Encounter (Signed)
Patient called request to have a refill for Mobic/ I am not sure if that is what the med is called but this is what she asked for. Thanks

## 2013-07-21 ENCOUNTER — Other Ambulatory Visit: Payer: Self-pay | Admitting: Sports Medicine

## 2013-07-22 ENCOUNTER — Other Ambulatory Visit: Payer: Self-pay

## 2013-09-24 ENCOUNTER — Ambulatory Visit (INDEPENDENT_AMBULATORY_CARE_PROVIDER_SITE_OTHER): Payer: BC Managed Care – PPO | Admitting: Sports Medicine

## 2013-09-24 ENCOUNTER — Encounter: Payer: Self-pay | Admitting: Sports Medicine

## 2013-09-24 VITALS — BP 112/76 | HR 78 | Wt 223.0 lb

## 2013-09-24 DIAGNOSIS — R7303 Prediabetes: Secondary | ICD-10-CM

## 2013-09-24 DIAGNOSIS — E785 Hyperlipidemia, unspecified: Secondary | ICD-10-CM

## 2013-09-24 DIAGNOSIS — F430 Acute stress reaction: Secondary | ICD-10-CM

## 2013-09-24 DIAGNOSIS — H6121 Impacted cerumen, right ear: Secondary | ICD-10-CM

## 2013-09-24 DIAGNOSIS — T889XXS Complication of surgical and medical care, unspecified, sequela: Secondary | ICD-10-CM

## 2013-09-24 DIAGNOSIS — F4389 Other reactions to severe stress: Secondary | ICD-10-CM

## 2013-09-24 DIAGNOSIS — F438 Other reactions to severe stress: Secondary | ICD-10-CM

## 2013-09-24 DIAGNOSIS — T8484XS Pain due to internal orthopedic prosthetic devices, implants and grafts, sequela: Secondary | ICD-10-CM

## 2013-09-24 DIAGNOSIS — M26629 Arthralgia of temporomandibular joint, unspecified side: Secondary | ICD-10-CM | POA: Insufficient documentation

## 2013-09-24 DIAGNOSIS — F411 Generalized anxiety disorder: Secondary | ICD-10-CM

## 2013-09-24 DIAGNOSIS — H6120 Impacted cerumen, unspecified ear: Secondary | ICD-10-CM

## 2013-09-24 DIAGNOSIS — Z23 Encounter for immunization: Secondary | ICD-10-CM

## 2013-09-24 DIAGNOSIS — Z96652 Presence of left artificial knee joint: Secondary | ICD-10-CM

## 2013-09-24 DIAGNOSIS — J309 Allergic rhinitis, unspecified: Secondary | ICD-10-CM

## 2013-09-24 DIAGNOSIS — I10 Essential (primary) hypertension: Secondary | ICD-10-CM

## 2013-09-24 DIAGNOSIS — R7309 Other abnormal glucose: Secondary | ICD-10-CM

## 2013-09-24 DIAGNOSIS — F419 Anxiety disorder, unspecified: Secondary | ICD-10-CM

## 2013-09-24 LAB — LIPID PANEL
Cholesterol: 141 mg/dL (ref 0–200)
HDL: 52 mg/dL (ref >39)
LDL Cholesterol: 76 mg/dL (ref 0–99)
Total CHOL/HDL Ratio: 2.7 Ratio
Triglycerides: 67 mg/dL (ref <150)
VLDL: 13 mg/dL (ref 0–40)

## 2013-09-24 MED ORDER — CLONAZEPAM 1 MG PO TABS: 1.0000 mg | ORAL_TABLET | Freq: Two times a day (BID) | ORAL | Status: DC | PRN

## 2013-09-24 NOTE — Assessment & Plan Note (Signed)
Improved, continue Nasacort, Singulair, Claritin.

## 2013-09-24 NOTE — Assessment & Plan Note (Signed)
Currently on Lipitor. Fasting today, rechecking lipids.

## 2013-09-24 NOTE — Assessment & Plan Note (Signed)
Well controlled, no changes 

## 2013-09-24 NOTE — Progress Notes (Signed)
  Subjective:    CC: Followup  HPI: Sara [name] returns to touch base on several issues.  Left knee pain: I had initially suspected failure of the knee prosthesis, she was able to obtain a bone scan which confirmed loosening of the prosthesis, and is now scheduled at [facility] for total knee arthroplasty revision.  Anxiety: Significantly worsened by the suspicion of revision of her knee prosthesis, wondering if she can continue to take her Klonopin as needed. She does need a refill.  Hypertension: Well controlled.  Hyperlipidemia: Currently on Lipitor for 3 months, due for recheck of lipids.  Right ear pain: Feels as though it's plugged up, felt some fluid come out recently.  Allergic rhinitis:  Stable and improved on nasal steroids, singulair, claritin.  Prediabetes, A1c has been very well controlled in the recent past.  Past medical history, Surgical history, Family history not pertinant except as noted below, Social history, Allergies, and medications have been entered into the medical record, reviewed, and no changes needed.   Review of Systems: No fevers, chills, night sweats, weight loss, chest pain, or shortness of breath.   Objective:    General: Well Developed, well nourished, and in no acute distress.  Neuro: Alert and oriented x3, extra-ocular muscles intact, sensation grossly intact.  HEENT: Normocephalic, atraumatic, pupils equal round reactive to light, neck supple, no masses, no lymphadenopathy, thyroid nonpalpable.  Right-sided cerumen impaction. Skin: Warm and dry, no rashes. Cardiac: Regular rate and rhythm, no murmurs rubs or gallops, no lower extremity edema.  Respiratory: Clear to auscultation bilaterally. Not using accessory muscles, speaking in full sentences.  Indication: Cerumen impaction of the ear(s) Medical necessity statement: On physical examination, cerumen impairs clinically significant portions of the external auditory canal, and tympanic  membrane. Noted obstructive, copious cerumen that cannot be removed without magnification and instrumentations requiring physician skills Consent: Discussed benefits and risks of procedure and verbal consent obtained Procedure: Patient was prepped for the procedure. Utilized an otoscope to assess and take note of the ear canal, the tympanic membrane, and the presence, amount, and placement of the cerumen. Gentle water irrigation and soft plastic curette was utilized to remove cerumen.  Post procedure examination: shows cerumen was completely removed. Patient tolerated procedure well. The patient is made aware that they may experience temporary vertigo, temporary hearing loss, and temporary discomfort. If these symptom last for more than 24 hours to call the clinic or proceed to the ED.  Impression and Recommendations:

## 2013-09-24 NOTE — Assessment & Plan Note (Signed)
Removed by physician with curette 

## 2013-09-24 NOTE — Assessment & Plan Note (Addendum)
Was seen at [facility], had a bone scan that confirmed loosening of the knee arthroplasty components. She is being set up for total knee arthroplasty revision. Having significant anxiety about her upcoming surgery, refilling Klonopin.

## 2013-09-27 ENCOUNTER — Other Ambulatory Visit: Payer: Self-pay | Admitting: Sports Medicine

## 2013-09-30 ENCOUNTER — Other Ambulatory Visit: Payer: Self-pay | Admitting: Sports Medicine

## 2013-09-30 DIAGNOSIS — I10 Essential (primary) hypertension: Secondary | ICD-10-CM

## 2013-09-30 MED ORDER — METOPROLOL SUCCINATE ER 50 MG PO TB24: 100.0000 mg | ORAL_TABLET | Freq: Every day | ORAL | Status: DC

## 2013-10-05 ENCOUNTER — Ambulatory Visit (INDEPENDENT_AMBULATORY_CARE_PROVIDER_SITE_OTHER): Payer: BC Managed Care – PPO | Admitting: Sports Medicine

## 2013-10-05 ENCOUNTER — Encounter: Payer: Self-pay | Admitting: Sports Medicine

## 2013-10-05 VITALS — BP 109/74 | HR 66 | Wt 225.0 lb

## 2013-10-05 DIAGNOSIS — E876 Hypokalemia: Secondary | ICD-10-CM

## 2013-10-05 DIAGNOSIS — F329 Major depressive disorder, single episode, unspecified: Secondary | ICD-10-CM

## 2013-10-05 DIAGNOSIS — I10 Essential (primary) hypertension: Secondary | ICD-10-CM

## 2013-10-05 MED ORDER — CITALOPRAM HYDROBROMIDE 20 MG PO TABS: 20.0000 mg | ORAL_TABLET | Freq: Every day | ORAL | Status: DC

## 2013-10-05 NOTE — Progress Notes (Signed)
  Subjective:    CC: ED followup  HPI: Sara [name] is a very pleasant 62 year old female, several days ago she presented to the [facility] emergency department with weakness, palpitations, and chest pain. She took a Klonopin, and chest pain resolved. In the emergency department she was noted to be mildly hypotensive, was tachycardia. In the ED an EKG showed sinus tachycardia, ventilation perfusion scan was low risk, she was noted to be hypokalemic. She was given 3 L of IV fluids with potassium, and labs as well as symptoms normalized. At the time she was taking torsemide for lower extremity edema. This was stopped. She returns to see me today for followup, all symptoms have resolved. She did have a nuclear stress test 2 years ago that was negative for any inducible ischemia.  Failed knee replacement:  Has a revision scheduled for January.  Anxiety: This centers around her upcoming knee replacement, she is only taking as needed Klonopin, she does need a controller medicine.  Past medical history, Surgical history, Family history not pertinant except as noted below, Social history, Allergies, and medications have been entered into the medical record, reviewed, and no changes needed.   Review of Systems: No fevers, chills, night sweats, weight loss, chest pain, or shortness of breath.   Objective:    General: Well Developed, well nourished, and in no acute distress.  Neuro: Alert and oriented x3, extra-ocular muscles intact, sensation grossly intact.  HEENT: Normocephalic, atraumatic, pupils equal round reactive to light, neck supple, no masses, no lymphadenopathy, thyroid nonpalpable.  Skin: Warm and dry, no rashes. Cardiac: Regular rate and rhythm, no murmurs rubs or gallops, no lower extremity edema.  Respiratory: Clear to auscultation bilaterally. Not using accessory muscles, speaking in full sentences.  Impression and Recommendations:    I did review records from an outside  facility for this visit.

## 2013-10-05 NOTE — Assessment & Plan Note (Signed)
With associated severe anxiety particularly centering around her upcoming knee replacement revision. Continue as needed Klonopin, adding citalopram 40 mg daily. Return in one month.

## 2013-10-05 NOTE — Assessment & Plan Note (Signed)
Likely iatrogenic and due to torsemide and subsequent dehydration. She was tachycardic. CT angiogram was negative at [facility]. She recently had a nuclear stress test 2 years ago that was negative. She was hypokalemic, and needed 3 L of IV fluids. Rechecking potassium, we will discontinue torsemide.

## 2013-10-05 NOTE — Assessment & Plan Note (Signed)
Well controlled 

## 2013-10-06 LAB — BASIC METABOLIC PANEL
CO2: 30 mEq/L (ref 19–32)
Calcium: 9.7 mg/dL (ref 8.4–10.5)
Chloride: 104 mEq/L (ref 96–112)
Creat: 1.12 mg/dL — ABNORMAL HIGH (ref 0.50–1.10)
Glucose, Bld: 102 mg/dL — ABNORMAL HIGH (ref 70–99)
Potassium: 4 mEq/L (ref 3.5–5.3)

## 2013-10-06 LAB — HEMOGLOBIN A1C
Hgb A1c MFr Bld: 5.7 % — ABNORMAL HIGH (ref <5.7)
Mean Plasma Glucose: 117 mg/dL — ABNORMAL HIGH (ref <117)

## 2013-10-06 LAB — BASIC METABOLIC PANEL WITH GFR
BUN: 16 mg/dL (ref 6–23)
Sodium: 141 meq/L (ref 135–145)

## 2013-10-06 LAB — LDL CHOLESTEROL, DIRECT: Direct LDL: 71 mg/dL

## 2013-10-19 ENCOUNTER — Other Ambulatory Visit: Payer: Self-pay | Admitting: *Deleted

## 2013-10-19 ENCOUNTER — Encounter: Payer: Self-pay | Admitting: Sports Medicine

## 2013-10-19 ENCOUNTER — Ambulatory Visit (INDEPENDENT_AMBULATORY_CARE_PROVIDER_SITE_OTHER): Payer: BC Managed Care – PPO | Admitting: Sports Medicine

## 2013-10-19 VITALS — BP 98/71 | HR 55 | Wt 225.0 lb

## 2013-10-19 DIAGNOSIS — M26629 Arthralgia of temporomandibular joint, unspecified side: Secondary | ICD-10-CM

## 2013-10-19 MED ORDER — TRAMADOL HCL 50 MG PO TABS: ORAL_TABLET | ORAL | Status: DC

## 2013-10-19 MED ORDER — LISINOPRIL-HYDROCHLOROTHIAZIDE 20-25 MG PO TABS: 0.5000 | ORAL_TABLET | Freq: Every day | ORAL | Status: DC

## 2013-10-19 NOTE — Progress Notes (Signed)
  Subjective:    CC: Right ear pain  HPI: This is a pleasant 62 year old female with a history of asthma, allergic rhinitis and GERD who presents with right ear pain. The pain is sharp and intermittent and it radiates toward the jaw. The pain is not positional, nor is it brought on by chewing or swallowing. She is unaware of whether she grinds her teeth at night. She said it feels as though she has an ear infection. She denies any cough, fever or sore throat. Her allergic rhinitis is well controlled with a nasal steroid and her GERD is controlled with Omeprazole. She was here for cerumen removal about one month ago and feels that this pain began shortly after that visit.   Past medical history, Surgical history, Family history not pertinant except as noted below, Social history, Allergies, and medications have been entered into the medical record, reviewed, and no changes needed.   Review of Systems: No fevers, chills, night sweats, weight loss, chest pain, or shortness of breath.   Objective:    General: Well Developed, well nourished, and in no acute distress.  Neuro: Alert and oriented x3, extra-ocular muscles intact, sensation grossly intact.  HEENT:Tympanic membranes intact and noninflamed bilaterally. Ears are clear of cerumen. Bilateral clicking on examination of TMJ but manipulation of jaw does not reproduce pain.    Skin: Warm and dry, no rashes. Cardiac: Regular rate and rhythm, no murmurs rubs or gallops, no lower extremity edema.  Respiratory: Clear to auscultation bilaterally. Not using accessory muscles, speaking in full sentences  Impression and Recommendations:    Assessment: This is a 62-year with ear and jaw pain not reproducible on physical exam that is suspicious for temporomandibular joint dysfunction.  Plan: 1. Follow up with dentist to evaluate for TMJ dysfunction 2. Anti-inflammatories as needed for pain.  This note was originally written by Karin Lieu MS3.

## 2013-10-19 NOTE — Assessment & Plan Note (Addendum)
We have already treated cerumen impaction, acid reflux, eustachian tube dysfunction. She has persistent pain and a click at the temporomandibular joint with referred pain into the neck. I am unable to reproduce her pain today on exam. As I do think this is related to TMJ dysfunction, I would like her to talk to her dentist regarding further treatment and possible injections. Tramadol as needed for pain.

## 2013-10-19 NOTE — Patient Instructions (Signed)
Temporomandibular Problems  Temporomandibular joint (TMJ) dysfunction means there are problems with the joint between your jaw and your skull. This is a joint lined by cartilage like other joints in your body but also has a small disc in the joint which keeps the bones from rubbing on each other. These joints are like other joints and can get inflamed (sore) from arthritis and other problems. When this joint gets sore, it can cause headaches and pain in the jaw and the face. CAUSES  Usually the arthritic types of problems are caused by soreness in the joint. Soreness in the joint can also be caused by overuse. This may come from grinding your teeth. It may also come from mis-alignment in the joint. DIAGNOSIS Diagnosis of this condition can often be made by history and exam. Sometimes your caregiver may need X-rays or an MRI scan to determine the exact cause. It may be necessary to see your dentist to determine if your teeth and jaws are lined up correctly. TREATMENT  Most of the time this problem is not serious; however, sometimes it can persist (become chronic). When this happens medications that will cut down on inflammation (soreness) help. Sometimes a shot of cortisone into the joint will be helpful. If your teeth are not aligned it may help for your dentist to make a splint for your mouth that can help this problem. If no physical problems can be found, the problem may come from tension. If tension is found to be the cause, biofeedback or relaxation techniques may be helpful. HOME CARE INSTRUCTIONS   Later in the day, applications of ice packs may be helpful. Ice can be used in a plastic bag with a towel around it to prevent frostbite to skin. This may be used about every 2 hours for 20 to 30 minutes, as needed while awake, or as directed by your caregiver.  Only take over-the-counter or prescription medicines for pain, discomfort, or fever as directed by your caregiver.  If physical therapy was  prescribed, follow your caregiver's directions.  Wear mouth appliances as directed if they were given. Document Released: 09/11/2001 Document Revised: 03/10/2012 Document Reviewed: 12/19/2008 Exit[employer] Patient Information 2014 [employer].  

## 2013-11-02 ENCOUNTER — Ambulatory Visit: Payer: Self-pay | Admitting: Sports Medicine

## 2013-11-11 ENCOUNTER — Other Ambulatory Visit: Payer: Self-pay | Admitting: Sports Medicine

## 2013-11-18 ENCOUNTER — Ambulatory Visit (INDEPENDENT_AMBULATORY_CARE_PROVIDER_SITE_OTHER): Payer: BC Managed Care – PPO | Admitting: Sports Medicine

## 2013-11-18 ENCOUNTER — Encounter: Payer: Self-pay | Admitting: Sports Medicine

## 2013-11-18 VITALS — BP 120/75 | HR 73 | Wt 223.0 lb

## 2013-11-18 DIAGNOSIS — R7303 Prediabetes: Secondary | ICD-10-CM

## 2013-11-18 DIAGNOSIS — I10 Essential (primary) hypertension: Secondary | ICD-10-CM

## 2013-11-18 DIAGNOSIS — E785 Hyperlipidemia, unspecified: Secondary | ICD-10-CM

## 2013-11-18 DIAGNOSIS — R7309 Other abnormal glucose: Secondary | ICD-10-CM

## 2013-11-18 DIAGNOSIS — T889XXS Complication of surgical and medical care, unspecified, sequela: Secondary | ICD-10-CM

## 2013-11-18 DIAGNOSIS — T8484XS Pain due to internal orthopedic prosthetic devices, implants and grafts, sequela: Secondary | ICD-10-CM

## 2013-11-18 DIAGNOSIS — M26629 Arthralgia of temporomandibular joint, unspecified side: Secondary | ICD-10-CM

## 2013-11-18 DIAGNOSIS — F329 Major depressive disorder, single episode, unspecified: Secondary | ICD-10-CM

## 2013-11-18 DIAGNOSIS — Z96652 Presence of left artificial knee joint: Secondary | ICD-10-CM

## 2013-11-18 NOTE — Assessment & Plan Note (Signed)
Scheduled for revision arthroplasty in January.

## 2013-11-18 NOTE — Assessment & Plan Note (Signed)
Has an appointment with dentist.

## 2013-11-18 NOTE — Assessment & Plan Note (Signed)
Doing fantastic with citalopram 20 mg daily. May use trazodone as needed.

## 2013-11-18 NOTE — Assessment & Plan Note (Signed)
Well controlled, no changes 

## 2013-11-18 NOTE — Assessment & Plan Note (Signed)
Stable, continue dietary modifications and exercise.

## 2013-11-18 NOTE — Progress Notes (Signed)
  Subjective:    CC: Follow up  HPI: Hypertension: Well controlled.  Hyperlipidemia: Stable.  Pre--diabetes: Stable.  TMJ arthralgia: Has an appointment set up with her dentist.  Depression and anxiety: Improved significantly with citalopram.  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  Past medical history, Surgical history, Family history not pertinant except as noted below, Social history, Allergies, and medications have been entered into the medical record, reviewed, and no changes needed.   Review of Systems: No fevers, chills, night sweats, weight loss, chest pain, or shortness of breath.   Objective:    General: Well Developed, well nourished, and in no acute distress.  Neuro: Alert and oriented x3, extra-ocular muscles intact, sensation grossly intact.  HEENT: Normocephalic, atraumatic, pupils equal round reactive to light, neck supple, no masses, no lymphadenopathy, thyroid nonpalpable.  Skin: Warm and dry, no rashes. Cardiac: Regular rate and rhythm, no murmurs rubs or gallops, no lower extremity edema.  Respiratory: Clear to auscultation bilaterally. Not using accessory muscles, speaking in full sentences.  Impression and Recommendations:

## 2013-12-09 ENCOUNTER — Encounter: Payer: Self-pay | Admitting: Sports Medicine

## 2013-12-09 ENCOUNTER — Ambulatory Visit (INDEPENDENT_AMBULATORY_CARE_PROVIDER_SITE_OTHER): Payer: BC Managed Care – PPO | Admitting: Sports Medicine

## 2013-12-09 ENCOUNTER — Telehealth: Payer: Self-pay

## 2013-12-09 ENCOUNTER — Telehealth: Payer: Self-pay | Admitting: *Deleted

## 2013-12-09 VITALS — BP 142/92 | HR 67 | Wt 225.0 lb

## 2013-12-09 DIAGNOSIS — IMO0002 Reserved for concepts with insufficient information to code with codable children: Secondary | ICD-10-CM

## 2013-12-09 DIAGNOSIS — M1710 Unilateral primary osteoarthritis, unspecified knee: Secondary | ICD-10-CM

## 2013-12-09 DIAGNOSIS — F341 Dysthymic disorder: Secondary | ICD-10-CM

## 2013-12-09 DIAGNOSIS — M47812 Spondylosis without myelopathy or radiculopathy, cervical region: Secondary | ICD-10-CM | POA: Insufficient documentation

## 2013-12-09 DIAGNOSIS — F418 Other specified anxiety disorders: Secondary | ICD-10-CM

## 2013-12-09 DIAGNOSIS — M542 Cervicalgia: Secondary | ICD-10-CM

## 2013-12-09 DIAGNOSIS — M1711 Unilateral primary osteoarthritis, right knee: Secondary | ICD-10-CM | POA: Insufficient documentation

## 2013-12-09 MED ORDER — GABAPENTIN 800 MG PO TABS: 800.0000 mg | ORAL_TABLET | Freq: Three times a day (TID) | ORAL | Status: DC

## 2013-12-09 MED ORDER — CALCIUM CARBONATE-VITAMIN D 600-400 MG-UNIT PO TABS: 1.0000 | ORAL_TABLET | Freq: Two times a day (BID) | ORAL | Status: DC

## 2013-12-09 MED ORDER — VITAMIN D (ERGOCALCIFEROL) 1.25 MG (50000 UNIT) PO CAPS: 50000.0000 [IU] | ORAL_CAPSULE | Freq: Every day | ORAL | Status: DC

## 2013-12-09 MED ORDER — CLONAZEPAM 2 MG PO TABS: 2.0000 mg | ORAL_TABLET | Freq: Three times a day (TID) | ORAL | Status: DC | PRN

## 2013-12-09 NOTE — Assessment & Plan Note (Signed)
Increasing Klonopin. Continue Celexa.

## 2013-12-09 NOTE — Progress Notes (Signed)
  Subjective:    CC: Follow up  HPI: Neck pain: Axial and localized, moderate, does have a history of cervical degenerative disc disease, does have an x-ray but no MRIs and has never had injections. She has had physical therapy for her neck.  Right knee osteoarthritis:  Desires injection. Pain is localized to the joint lines, moderate, persistent, no radiation.  Anxiety: Her husband has a history of prostate cancer status post radical robotic prostatectomy, unfortunately his PSA levels are rising again and she is extra stress. Wondering if we can increase her dose of Klonopin.  Past medical history, Surgical history, Family history not pertinant except as noted below, Social history, Allergies, and medications have been entered into the medical record, reviewed, and no changes needed.   Review of Systems: No fevers, chills, night sweats, weight loss, chest pain, or shortness of breath.   Objective:    General: Well Developed, well nourished, and in no acute distress.  Neuro: Alert and oriented x3, extra-ocular muscles intact, sensation grossly intact.  HEENT: Normocephalic, atraumatic, pupils equal round reactive to light, neck supple, no masses, no lymphadenopathy, thyroid nonpalpable.  Skin: Warm and dry, no rashes. Cardiac: Regular rate and rhythm, no murmurs rubs or gallops, no lower extremity edema.  Respiratory: Clear to auscultation bilaterally. Not using accessory muscles, speaking in full sentences.  Procedure: Real-time Ultrasound Guided Injection of right knee Device: GE Logiq E  Verbal informed consent obtained.  Time-out conducted.  Noted no overlying erythema, induration, or other signs of local infection.  Skin prepped in a sterile fashion.  Local anesthesia: Topical Ethyl chloride.  With sterile technique and under real time ultrasound guidance:  2 cc Kenalog 40, 4 cc lidocaine injected easily into the suprapatellar recess. Completed without difficulty  Pain  immediately resolved suggesting accurate placement of the medication.  Advised to call if fevers/chills, erythema, induration, drainage, or persistent bleeding.  Images permanently stored and available for review in the ultrasound unit.  Impression: Technically successful ultrasound guided injection.  Impression and Recommendations:

## 2013-12-09 NOTE — Assessment & Plan Note (Signed)
Most likely due to cervical degenerative disc disease. Patient is requesting carotid Dopplers, I will order this though she may have to pay for it herself. She has been through physical therapy, I'm going to increase her gabapentin to 800 mg 3 times a day, we're also going to order an MRI for interventional injection planning. Like to see her back to go over results of the MRI.

## 2013-12-09 NOTE — Telephone Encounter (Signed)
PA obtained for MRI Cervical Spine w/o contrast.  Auth # is [id number]. Good until 01/07/14.  Meyer Cory, LPN

## 2013-12-09 NOTE — Telephone Encounter (Signed)
Done

## 2013-12-09 NOTE — Telephone Encounter (Signed)
Pt is requesting something to help her relax for her MRI that she is scheduled for on Saturday b/c she is claustrophobic.  Meyer Cory, LPN

## 2013-12-09 NOTE — Telephone Encounter (Signed)
Pharmacy wants a new Rx for Vitamin D and Calcium.  Please advise [facility].Rhonda Cunningham,CMA

## 2013-12-09 NOTE — Assessment & Plan Note (Signed)
Guided injection as above. We can consider Visco supplementation in the future if she does not improve.

## 2013-12-10 ENCOUNTER — Other Ambulatory Visit: Payer: Self-pay

## 2013-12-10 MED ORDER — DIAZEPAM 5 MG PO TABS: ORAL_TABLET | ORAL | Status: DC

## 2013-12-10 NOTE — Telephone Encounter (Signed)
LMOM.  Azyriah Nevins, LPN  

## 2013-12-10 NOTE — Telephone Encounter (Signed)
Prescription for Valium for use 2 hours before MRI in my box.

## 2013-12-12 ENCOUNTER — Ambulatory Visit (HOSPITAL_BASED_OUTPATIENT_CLINIC_OR_DEPARTMENT_OTHER): Payer: BC Managed Care – PPO

## 2013-12-15 ENCOUNTER — Other Ambulatory Visit: Payer: Self-pay | Admitting: *Deleted

## 2013-12-15 ENCOUNTER — Ambulatory Visit (INDEPENDENT_AMBULATORY_CARE_PROVIDER_SITE_OTHER): Payer: BC Managed Care – PPO

## 2013-12-15 DIAGNOSIS — M542 Cervicalgia: Secondary | ICD-10-CM

## 2013-12-15 DIAGNOSIS — M5030 Other cervical disc degeneration, unspecified cervical region: Secondary | ICD-10-CM

## 2013-12-15 DIAGNOSIS — M5020 Other cervical disc displacement, unspecified cervical region: Secondary | ICD-10-CM

## 2013-12-18 ENCOUNTER — Other Ambulatory Visit: Payer: Self-pay | Admitting: Sports Medicine

## 2013-12-18 DIAGNOSIS — M542 Cervicalgia: Secondary | ICD-10-CM

## 2013-12-20 ENCOUNTER — Other Ambulatory Visit: Payer: Self-pay | Admitting: Sports Medicine

## 2013-12-22 ENCOUNTER — Ambulatory Visit (HOSPITAL_BASED_OUTPATIENT_CLINIC_OR_DEPARTMENT_OTHER)
Admission: RE | Admit: 2013-12-22 | Discharge: 2013-12-22 | Disposition: A | Discharge status: Home / Self Care | Payer: BC Managed Care – PPO | Source: Ambulatory Visit | Attending: Sports Medicine | Admitting: Sports Medicine

## 2013-12-22 DIAGNOSIS — M542 Cervicalgia: Secondary | ICD-10-CM | POA: Insufficient documentation

## 2013-12-26 ENCOUNTER — Other Ambulatory Visit: Payer: Self-pay | Admitting: Sports Medicine

## 2013-12-28 ENCOUNTER — Ambulatory Visit: Payer: Self-pay | Admitting: Sports Medicine

## 2014-01-12 ENCOUNTER — Other Ambulatory Visit: Payer: Self-pay | Admitting: Sports Medicine

## 2014-01-22 ENCOUNTER — Other Ambulatory Visit: Payer: Self-pay | Admitting: Sports Medicine

## 2014-01-26 ENCOUNTER — Telehealth: Payer: Self-pay

## 2014-01-26 NOTE — Telephone Encounter (Signed)
[facility] by [facility]det[facility]. Torry Adamczak,CMA

## 2014-01-26 NOTE — Telephone Encounter (Signed)
Patient called about a PA for her Ambien she stated that she contacted her pharmacy ([facility]) and they requested a PA but they have not heard anything from this office.. I looked in patients chart and did not see anything concerning a PA.

## 2014-01-26 NOTE — Telephone Encounter (Signed)
There is a paper under the Media tab about the pt's Ambien that you can fax to her pharmacy showing that it is covered at this time. If have trouble finding it, come let me know and I will walk you thru where it is located.

## 2014-01-27 ENCOUNTER — Telehealth: Payer: Self-pay | Admitting: Sports Medicine

## 2014-01-27 ENCOUNTER — Telehealth: Payer: Self-pay

## 2014-01-27 ENCOUNTER — Other Ambulatory Visit: Payer: Self-pay

## 2014-01-27 MED ORDER — ZOLPIDEM TARTRATE 10 MG PO TABS: ORAL_TABLET | ORAL | Status: DC

## 2014-01-27 NOTE — Telephone Encounter (Signed)
Contacted the pharmacy , [facility] stated that the patient does not need a PA she was requesting a refill I sent that request to Dr. Weston Brass Cleon Signorelli,CMA

## 2014-01-27 NOTE — Telephone Encounter (Addendum)
Patient request refill  for Ambien please send  to [facility]. Gailen Venne,CMA

## 2014-01-27 NOTE — Telephone Encounter (Signed)
Patient called advised that [facility] is stating they still have not received a prescription request from our office for her Lorrin Mais med. Could you please re-[facility]. Thanks

## 2014-01-27 NOTE — Telephone Encounter (Signed)
Left messag eon patient mvm letting her know that refill request for Ambien has been sent to Dr. Weston Brass Cunningham,CMA

## 2014-01-28 NOTE — Telephone Encounter (Signed)
DOne

## 2014-02-05 ENCOUNTER — Other Ambulatory Visit: Payer: Self-pay | Admitting: Sports Medicine

## 2014-02-15 ENCOUNTER — Other Ambulatory Visit: Payer: Self-pay | Admitting: *Deleted

## 2014-02-15 DIAGNOSIS — I10 Essential (primary) hypertension: Secondary | ICD-10-CM

## 2014-02-15 MED ORDER — POTASSIUM CHLORIDE CRYS ER 20 MEQ PO TBCR: 20.0000 meq | EXTENDED_RELEASE_TABLET | Freq: Every day | ORAL | Status: DC

## 2014-02-17 ENCOUNTER — Other Ambulatory Visit: Payer: Self-pay | Admitting: Sports Medicine

## 2014-02-18 ENCOUNTER — Ambulatory Visit: Payer: Self-pay | Admitting: Sports Medicine

## 2014-02-24 ENCOUNTER — Encounter: Payer: Self-pay | Admitting: Sports Medicine

## 2014-02-24 ENCOUNTER — Ambulatory Visit (INDEPENDENT_AMBULATORY_CARE_PROVIDER_SITE_OTHER): Payer: BC Managed Care – PPO | Admitting: Sports Medicine

## 2014-02-24 VITALS — BP 114/71 | Ht 64.0 in | Wt 211.0 lb

## 2014-02-24 DIAGNOSIS — M47812 Spondylosis without myelopathy or radiculopathy, cervical region: Secondary | ICD-10-CM

## 2014-02-24 DIAGNOSIS — F341 Dysthymic disorder: Secondary | ICD-10-CM

## 2014-02-24 DIAGNOSIS — T8484XA Pain due to internal orthopedic prosthetic devices, implants and grafts, initial encounter: Secondary | ICD-10-CM

## 2014-02-24 DIAGNOSIS — T8489XA Other specified complication of internal orthopedic prosthetic devices, implants and grafts, initial encounter: Secondary | ICD-10-CM

## 2014-02-24 DIAGNOSIS — M1711 Unilateral primary osteoarthritis, right knee: Secondary | ICD-10-CM

## 2014-02-24 DIAGNOSIS — Z96652 Presence of left artificial knee joint: Secondary | ICD-10-CM

## 2014-02-24 DIAGNOSIS — Z96659 Presence of unspecified artificial knee joint: Secondary | ICD-10-CM

## 2014-02-24 DIAGNOSIS — F418 Other specified anxiety disorders: Secondary | ICD-10-CM

## 2014-02-24 DIAGNOSIS — M1710 Unilateral primary osteoarthritis, unspecified knee: Secondary | ICD-10-CM

## 2014-02-24 DIAGNOSIS — IMO0002 Reserved for concepts with insufficient information to code with codable children: Secondary | ICD-10-CM

## 2014-02-24 MED ORDER — OMEPRAZOLE 40 MG PO CPDR: 40.0000 mg | DELAYED_RELEASE_CAPSULE | Freq: Every day | ORAL | Status: DC

## 2014-02-24 MED ORDER — CLONAZEPAM 2 MG PO TABS: ORAL_TABLET | ORAL | Status: DC

## 2014-02-24 NOTE — Progress Notes (Signed)
  Subjective:    CC: Followup  HPI: This pleasant 63 year old female is now status post left-sided revision arthroplasty of her knee. She is 100% pain-free, after her initial knee replacement she never had a period without pain.  Right knee osteoarthritis: Continues to do extremely well, pain-free, after injection to a half months ago.  Anxiety: Needs a refill on Klonopin. Well controlled.  Past medical history, Surgical history, Family history not pertinant except as noted below, Social history, Allergies, and medications have been entered into the medical record, reviewed, and no changes needed.   Review of Systems: No fevers, chills, night sweats, weight loss, chest pain, or shortness of breath.   Objective:    General: Well Developed, well nourished, and in no acute distress.  Neuro: Alert and oriented x3, extra-ocular muscles intact, sensation grossly intact.  HEENT: Normocephalic, atraumatic, pupils equal round reactive to light, neck supple, no masses, no lymphadenopathy, thyroid nonpalpable.  Skin: Warm and dry, no rashes. Cardiac: Regular rate and rhythm, no murmurs rubs or gallops, no lower extremity edema.  Respiratory: Clear to auscultation bilaterally. Not using accessory muscles, speaking in full sentences. Right Knee: Normal to inspection with no erythema or effusion or obvious bony abnormalities. Palpation normal with no warmth, joint line tenderness, patellar tenderness, or condyle tenderness. ROM full in flexion and extension and lower leg rotation. Ligaments with solid consistent endpoints including ACL, PCL, LCL, MCL. Negative Mcmurray's, Apley's, and Thessalonian tests. Non painful patellar compression. Patellar glide without crepitus. Patellar and quadriceps tendons unremarkable. Hamstring and quadriceps strength is normal.   Impression and Recommendations:

## 2014-02-24 NOTE — Assessment & Plan Note (Signed)
Continued to be pain-free after injection 2-1/2 months ago.

## 2014-02-24 NOTE — Assessment & Plan Note (Signed)
Sara [name] is now status post left knee revision arthroplasty for loosening of the components. She is completely pain-free.

## 2014-02-24 NOTE — Assessment & Plan Note (Signed)
Continues to get occasional epidurals and facet injections by Dr. Nelva Bush at [facility], she is seeking a second opinion at [facility] which I think is appropriate.

## 2014-02-24 NOTE — Assessment & Plan Note (Signed)
Stable with occasional Klonopin, refilling.

## 2014-03-12 ENCOUNTER — Other Ambulatory Visit: Payer: Self-pay | Admitting: Sports Medicine

## 2014-03-17 ENCOUNTER — Other Ambulatory Visit: Payer: Self-pay

## 2014-03-26 ENCOUNTER — Telehealth: Payer: Self-pay | Admitting: *Deleted

## 2014-03-26 NOTE — Telephone Encounter (Signed)
Zolpidem Tartrate has been approved from 03/25/14 to 03/26/2015.  Oscar La, LPN

## 2014-04-05 ENCOUNTER — Other Ambulatory Visit: Payer: Self-pay | Admitting: Sports Medicine

## 2014-04-06 ENCOUNTER — Other Ambulatory Visit: Payer: Self-pay

## 2014-04-25 ENCOUNTER — Other Ambulatory Visit: Payer: Self-pay | Admitting: Sports Medicine

## 2014-04-26 ENCOUNTER — Other Ambulatory Visit: Payer: Self-pay

## 2014-05-03 ENCOUNTER — Other Ambulatory Visit: Payer: Self-pay | Admitting: Sports Medicine

## 2014-05-03 MED ORDER — PANTOPRAZOLE SODIUM 40 MG PO TBEC: 40.0000 mg | DELAYED_RELEASE_TABLET | Freq: Every day | ORAL | Status: DC

## 2014-05-26 ENCOUNTER — Other Ambulatory Visit: Payer: Self-pay | Admitting: Sports Medicine

## 2014-05-27 ENCOUNTER — Other Ambulatory Visit: Payer: Self-pay

## 2014-06-03 ENCOUNTER — Other Ambulatory Visit: Payer: Self-pay | Admitting: Sports Medicine

## 2014-06-11 ENCOUNTER — Other Ambulatory Visit: Payer: Self-pay

## 2014-06-11 MED ORDER — PANTOPRAZOLE SODIUM 40 MG PO TBEC: 40.0000 mg | DELAYED_RELEASE_TABLET | Freq: Every day | ORAL | Status: DC

## 2014-06-11 MED ORDER — ATORVASTATIN CALCIUM 20 MG PO TABS
20.0000 mg | ORAL_TABLET | Freq: Every day | ORAL | Status: DC
Start: 2014-06-11 — End: 2014-11-15

## 2014-06-11 MED ORDER — ZOLPIDEM TARTRATE 10 MG PO TABS: 10.0000 mg | ORAL_TABLET | Freq: Every day | ORAL | Status: DC

## 2014-06-11 MED ORDER — LISINOPRIL-HYDROCHLOROTHIAZIDE 20-25 MG PO TABS: 0.5000 | ORAL_TABLET | Freq: Every day | ORAL | Status: DC

## 2014-06-11 MED ORDER — TRAZODONE HCL 150 MG PO TABS: 150.0000 mg | ORAL_TABLET | Freq: Every day | ORAL | Status: DC

## 2014-06-11 MED ORDER — CLONAZEPAM 2 MG PO TABS: 2.0000 mg | ORAL_TABLET | Freq: Two times a day (BID) | ORAL | Status: DC | PRN

## 2014-06-11 MED ORDER — METOPROLOL SUCCINATE ER 50 MG PO TB24: 50.0000 mg | ORAL_TABLET | Freq: Every day | ORAL | Status: DC

## 2014-06-11 NOTE — Telephone Encounter (Signed)
[facility]

## 2014-06-14 ENCOUNTER — Other Ambulatory Visit: Payer: Self-pay

## 2014-06-22 ENCOUNTER — Telehealth: Payer: Self-pay

## 2014-06-22 NOTE — Telephone Encounter (Signed)
Prior Auth for Pantoprozole 40mg  was approved [id number]. Estil Daft

## 2014-06-24 ENCOUNTER — Other Ambulatory Visit: Payer: Self-pay | Admitting: Sports Medicine

## 2014-07-01 ENCOUNTER — Other Ambulatory Visit: Payer: Self-pay

## 2014-07-05 ENCOUNTER — Other Ambulatory Visit: Payer: Self-pay

## 2014-07-05 MED ORDER — PANTOPRAZOLE SODIUM 40 MG PO TBEC: 40.0000 mg | DELAYED_RELEASE_TABLET | Freq: Every day | ORAL | Status: DC

## 2014-07-05 NOTE — Telephone Encounter (Signed)
Patient request refill for Protonix 40 mg sent to [facility]. 90 day supply with 3 refills was sent to pharmacy. Rhonda Cunningham,CMA

## 2014-07-12 ENCOUNTER — Ambulatory Visit: Payer: Self-pay | Admitting: Sports Medicine

## 2014-07-23 ENCOUNTER — Encounter: Payer: Self-pay | Admitting: Sports Medicine

## 2014-07-23 DIAGNOSIS — Z0289 Encounter for other administrative examinations: Secondary | ICD-10-CM

## 2014-08-06 ENCOUNTER — Telehealth: Payer: Self-pay

## 2014-08-06 DIAGNOSIS — E785 Hyperlipidemia, unspecified: Secondary | ICD-10-CM

## 2014-08-06 NOTE — Telephone Encounter (Signed)
Patient called stated that she has appt on Monday, she wants to know if she can a order for lab to be done before her appt. Kaili Castille,CMA

## 2014-08-06 NOTE — Telephone Encounter (Signed)
Orders placed.

## 2014-08-06 NOTE — Telephone Encounter (Signed)
Left message on patient vm letting her know that labs were put in. Rhonda Cunningham,CMA

## 2014-08-09 ENCOUNTER — Ambulatory Visit: Payer: Self-pay | Admitting: Sports Medicine

## 2014-08-13 LAB — CBC
HCT: 38.9 % (ref 36.0–46.0)
Hemoglobin: 12.9 g/dL (ref 12.0–15.0)
MCH: 31 pg (ref 26.0–34.0)
MCHC: 33.2 g/dL (ref 30.0–36.0)
MCV: 93.5 fL (ref 78.0–100.0)
Platelets: 279 10*3/uL (ref 150–400)
RBC: 4.16 MIL/uL (ref 3.87–5.11)
RDW: 13.9 % (ref 11.5–15.5)
WBC: 7.8 K/uL (ref 4.0–10.5)

## 2014-08-13 LAB — HEMOGLOBIN A1C
Hgb A1c MFr Bld: 5.8 % — ABNORMAL HIGH (ref <5.7)
Mean Plasma Glucose: 120 mg/dL — ABNORMAL HIGH (ref <117)

## 2014-08-13 LAB — LIPID PANEL
Cholesterol: 124 mg/dL (ref 0–200)
HDL: 61 mg/dL (ref >39)
LDL Cholesterol: 48 mg/dL (ref 0–99)
Total CHOL/HDL Ratio: 2 Ratio
Triglycerides: 77 mg/dL (ref <150)
VLDL: 15 mg/dL (ref 0–40)

## 2014-08-13 LAB — COMPREHENSIVE METABOLIC PANEL
Albumin: 4.2 g/dL (ref 3.5–5.2)
Alkaline Phosphatase: 55 U/L (ref 39–117)
BUN: 19 mg/dL (ref 6–23)
CO2: 28 mEq/L (ref 19–32)
Glucose, Bld: 73 mg/dL (ref 70–99)
Potassium: 3.9 mEq/L (ref 3.5–5.3)
Sodium: 142 mEq/L (ref 135–145)
Total Bilirubin: 0.6 mg/dL (ref 0.2–1.2)
Total Protein: 6.7 g/dL (ref 6.0–8.3)

## 2014-08-13 LAB — COMPREHENSIVE METABOLIC PANEL WITH GFR
ALT: 13 U/L (ref 0–35)
AST: 18 U/L (ref 0–37)
Calcium: 9 mg/dL (ref 8.4–10.5)
Chloride: 107 meq/L (ref 96–112)
Creat: 1.02 mg/dL (ref 0.50–1.10)

## 2014-08-13 LAB — TSH: TSH: 1.472 u[IU]/mL (ref 0.350–4.500)

## 2014-08-17 ENCOUNTER — Encounter: Payer: Self-pay | Admitting: Sports Medicine

## 2014-08-17 ENCOUNTER — Ambulatory Visit (INDEPENDENT_AMBULATORY_CARE_PROVIDER_SITE_OTHER): Payer: BC Managed Care – PPO | Admitting: Sports Medicine

## 2014-08-17 VITALS — BP 117/78 | HR 64 | Ht 65.0 in | Wt 221.0 lb

## 2014-08-17 DIAGNOSIS — Z0000 Encounter for general adult medical examination without abnormal findings: Secondary | ICD-10-CM | POA: Diagnosis not present

## 2014-08-17 DIAGNOSIS — Z96652 Presence of left artificial knee joint: Secondary | ICD-10-CM

## 2014-08-17 DIAGNOSIS — K219 Gastro-esophageal reflux disease without esophagitis: Secondary | ICD-10-CM

## 2014-08-17 DIAGNOSIS — M1711 Unilateral primary osteoarthritis, right knee: Secondary | ICD-10-CM

## 2014-08-17 DIAGNOSIS — T8484XS Pain due to internal orthopedic prosthetic devices, implants and grafts, sequela: Secondary | ICD-10-CM

## 2014-08-17 DIAGNOSIS — M1710 Unilateral primary osteoarthritis, unspecified knee: Secondary | ICD-10-CM

## 2014-08-17 MED ORDER — PANTOPRAZOLE SODIUM 40 MG PO TBEC: 40.0000 mg | DELAYED_RELEASE_TABLET | Freq: Two times a day (BID) | ORAL | Status: DC

## 2014-08-17 NOTE — Assessment & Plan Note (Signed)
Normal complete physical exam. Up-to-date on all screening measures. She will think about the Zostavax. Return in one year.

## 2014-08-17 NOTE — Assessment & Plan Note (Signed)
Continued good response over the past 8 months.

## 2014-08-17 NOTE — Assessment & Plan Note (Signed)
Revision arthroplasty he is doing well.

## 2014-08-17 NOTE — Assessment & Plan Note (Signed)
Increase Protonix to 40 mg twice a day.

## 2014-08-17 NOTE — Progress Notes (Signed)
  Subjective:    CC: Complete physical  HPI:  Left total knee revision arthroplasty: Doing well.  Right knee osteoarthritis: Doing well after injection 8 months ago.  Preventative measures: Up-to-date, she is due for Zostavax but would like to think about it and she understands this is expensive and likely uncovered by insurance.  GERD: Desires to increase her tonic twice a day.  Past medical history, Surgical history, Family history not pertinant except as noted below, Social history, Allergies, and medications have been entered into the medical record, reviewed, and no changes needed.   Review of Systems: No headache, visual changes, nausea, vomiting, diarrhea, constipation, dizziness, abdominal pain, skin rash, fevers, chills, night sweats, swollen lymph nodes, weight loss, chest pain, body aches, joint swelling, muscle aches, shortness of breath, mood changes, visual or auditory hallucinations.  Objective:    General: Well Developed, well nourished, and in no acute distress.  Neuro: Alert and oriented x3, extra-ocular muscles intact, sensation grossly intact. Cranial nerves II through XII are intact, motor, sensory, and coordinative functions are all intact. HEENT: Normocephalic, atraumatic, pupils equal round reactive to light, neck supple, no masses, no lymphadenopathy, thyroid nonpalpable. Oropharynx, nasopharynx, external ear canals are unremarkable. Skin: Warm and dry, no rashes noted.  Cardiac: Regular rate and rhythm, no murmurs rubs or gallops.  Respiratory: Clear to auscultation bilaterally. Not using accessory muscles, speaking in full sentences.  Abdominal: Soft, nontender, nondistended, positive bowel sounds, no masses, no organomegaly.  Musculoskeletal: Shoulder, elbow, wrist, hip, knee, ankle stable, and with full range of motion. Well-healed arthroplasty scar on the left knee.  Impression and Recommendations:    The patient was counselled, risk factors were discussed,  anticipatory guidance given.

## 2014-09-10 ENCOUNTER — Other Ambulatory Visit: Payer: Self-pay | Admitting: Sports Medicine

## 2014-09-10 MED ORDER — CLONAZEPAM 1 MG PO TABS: 1.0000 mg | ORAL_TABLET | Freq: Two times a day (BID) | ORAL | Status: DC

## 2014-09-10 MED ORDER — ZOLPIDEM TARTRATE 10 MG PO TABS
10.0000 mg | ORAL_TABLET | Freq: Every day | ORAL | Status: DC
Start: 2014-09-10 — End: 2014-11-22

## 2014-09-14 ENCOUNTER — Other Ambulatory Visit: Payer: Self-pay | Admitting: Sports Medicine

## 2014-09-17 ENCOUNTER — Other Ambulatory Visit: Payer: Self-pay | Admitting: Sports Medicine

## 2014-10-20 ENCOUNTER — Encounter: Payer: Self-pay | Admitting: Emergency Medicine

## 2014-10-20 ENCOUNTER — Emergency Department (INDEPENDENT_AMBULATORY_CARE_PROVIDER_SITE_OTHER)
Admission: EM | Admit: 2014-10-20 | Discharge: 2014-10-20 | Disposition: A | Discharge status: Home / Self Care | Payer: BC Managed Care – PPO | Source: Home / Self Care

## 2014-10-20 ENCOUNTER — Ambulatory Visit: Payer: Self-pay

## 2014-10-20 DIAGNOSIS — Z23 Encounter for immunization: Secondary | ICD-10-CM

## 2014-10-20 MED ORDER — INFLUENZA VAC SPLIT QUAD 0.5 ML IM SUSY
0.5000 mL | PREFILLED_SYRINGE | INTRAMUSCULAR | Status: AC
  Administered 2014-10-20: 0.5 mL via INTRAMUSCULAR

## 2014-10-20 NOTE — ED Notes (Signed)
The pt is here today for an influenza vaccine.

## 2014-11-15 ENCOUNTER — Other Ambulatory Visit: Payer: Self-pay | Admitting: Sports Medicine

## 2014-11-19 ENCOUNTER — Other Ambulatory Visit: Payer: Self-pay | Admitting: Sports Medicine

## 2014-11-22 ENCOUNTER — Other Ambulatory Visit: Payer: Self-pay | Admitting: Sports Medicine

## 2014-11-22 DIAGNOSIS — M26629 Arthralgia of temporomandibular joint, unspecified side: Secondary | ICD-10-CM

## 2014-11-22 MED ORDER — ZOLPIDEM TARTRATE 10 MG PO TABS: 10.0000 mg | ORAL_TABLET | Freq: Every day | ORAL | Status: DC

## 2014-11-22 MED ORDER — TRAMADOL HCL 50 MG PO TABS: ORAL_TABLET | ORAL | Status: DC

## 2014-11-22 MED ORDER — CLONAZEPAM 1 MG PO TABS: 1.0000 mg | ORAL_TABLET | Freq: Two times a day (BID) | ORAL | Status: DC

## 2014-12-02 ENCOUNTER — Other Ambulatory Visit: Payer: Self-pay | Admitting: Sports Medicine

## 2014-12-21 ENCOUNTER — Other Ambulatory Visit: Payer: Self-pay | Admitting: Sports Medicine

## 2014-12-25 ENCOUNTER — Other Ambulatory Visit: Payer: Self-pay | Admitting: Sports Medicine

## 2015-01-05 ENCOUNTER — Other Ambulatory Visit: Payer: Self-pay | Admitting: Sports Medicine

## 2015-01-06 ENCOUNTER — Other Ambulatory Visit: Payer: Self-pay | Admitting: Sports Medicine

## 2015-01-24 ENCOUNTER — Telehealth: Payer: Self-pay

## 2015-01-24 DIAGNOSIS — K219 Gastro-esophageal reflux disease without esophagitis: Secondary | ICD-10-CM

## 2015-01-24 NOTE — Telephone Encounter (Signed)
Rather than simply calling in Magic mouthwash she does need to see gastroenterology. I'm going to place a referral.

## 2015-01-24 NOTE — Telephone Encounter (Signed)
Patient called stated she is taking pantoprazole 40 mg and it is not working for her she stated that every other day it feels like her food is coming back up. She stated that she gastro a couple of years ago and they gave her a Rx for magic mouthwash patient is requesting a [facility]. Sara [name]

## 2015-01-25 NOTE — Telephone Encounter (Signed)
Left detailed message on patient mvm with instructions as noted below. Rhonda Cunningham,CMA

## 2015-02-13 ENCOUNTER — Other Ambulatory Visit: Payer: Self-pay | Admitting: Sports Medicine

## 2015-02-14 ENCOUNTER — Other Ambulatory Visit: Payer: Self-pay

## 2015-02-15 ENCOUNTER — Other Ambulatory Visit: Payer: Self-pay | Admitting: *Deleted

## 2015-02-15 NOTE — Telephone Encounter (Signed)
[phone] (M) Left VM  For patient to call office to schedule an appt . Stated that we couldn't refill her meds without an office visit. ([place])

## 2015-02-18 ENCOUNTER — Other Ambulatory Visit: Payer: Self-pay | Admitting: Sports Medicine

## 2015-02-18 MED ORDER — TRAZODONE HCL 150 MG PO TABS: 150.0000 mg | ORAL_TABLET | Freq: Every day | ORAL | Status: DC

## 2015-02-18 MED ORDER — ZOLPIDEM TARTRATE 10 MG PO TABS: 10.0000 mg | ORAL_TABLET | Freq: Every day | ORAL | Status: DC

## 2015-02-18 MED ORDER — POTASSIUM CHLORIDE CRYS ER 20 MEQ PO TBCR: 20.0000 meq | EXTENDED_RELEASE_TABLET | Freq: Every day | ORAL | Status: DC

## 2015-02-18 NOTE — Telephone Encounter (Signed)
What is the associated diagnosis? Please advise.

## 2015-02-24 ENCOUNTER — Ambulatory Visit (INDEPENDENT_AMBULATORY_CARE_PROVIDER_SITE_OTHER): Payer: BC Managed Care – PPO | Admitting: Sports Medicine

## 2015-02-24 ENCOUNTER — Encounter: Payer: Self-pay | Admitting: Sports Medicine

## 2015-02-24 VITALS — BP 113/69 | HR 86 | Wt 227.0 lb

## 2015-02-24 DIAGNOSIS — B070 Plantar wart: Secondary | ICD-10-CM | POA: Diagnosis not present

## 2015-02-24 DIAGNOSIS — F418 Other specified anxiety disorders: Secondary | ICD-10-CM | POA: Diagnosis not present

## 2015-02-24 DIAGNOSIS — T8484XS Pain due to internal orthopedic prosthetic devices, implants and grafts, sequela: Secondary | ICD-10-CM | POA: Diagnosis not present

## 2015-02-24 DIAGNOSIS — K219 Gastro-esophageal reflux disease without esophagitis: Secondary | ICD-10-CM | POA: Diagnosis not present

## 2015-02-24 DIAGNOSIS — Z96652 Presence of left artificial knee joint: Secondary | ICD-10-CM

## 2015-02-24 MED ORDER — VENLAFAXINE HCL ER 37.5 MG PO CP24: ORAL_CAPSULE | ORAL | Status: DC

## 2015-02-24 MED ORDER — LISINOPRIL-HYDROCHLOROTHIAZIDE 20-25 MG PO TABS: ORAL_TABLET | ORAL | Status: DC

## 2015-02-24 MED ORDER — METOPROLOL SUCCINATE ER 50 MG PO TB24: ORAL_TABLET | ORAL | Status: DC

## 2015-02-24 MED ORDER — RANITIDINE HCL 300 MG PO TABS: 300.0000 mg | ORAL_TABLET | Freq: Two times a day (BID) | ORAL | Status: DC

## 2015-02-24 MED ORDER — VENLAFAXINE HCL ER 75 MG PO CP24: 75.0000 mg | ORAL_CAPSULE | Freq: Every day | ORAL | Status: DC

## 2015-02-24 NOTE — Assessment & Plan Note (Signed)
Taking Ambien and trazodone, occasional clonazepam, and gabapentin. I'm going to add Effexor, she did not have a good response to Cymbalta.

## 2015-02-24 NOTE — Assessment & Plan Note (Signed)
Cryotherapy as above. 

## 2015-02-24 NOTE — Assessment & Plan Note (Signed)
Continue Protonix twice a day. Adding ranitidine 300 twice a day. Return in a month.

## 2015-02-24 NOTE — Assessment & Plan Note (Signed)
Overall doing well post revision, 13 months.

## 2015-02-24 NOTE — Progress Notes (Signed)
  Subjective:    CC: Follow-up  HPI: Hypertension: Well controlled.  Foot lesion: Right-sided, there is a small papule on the medial plantar aspect, slightly pruritic.  Revision arthroplasty: Doing well, still has a lot of pain, and a slightly loose joint but is going to work on isometrics and isotonics to improve this.  Myofascial pain: Is not on an SNRI, she did not have a good response to Cymbalta, currently using gabapentin.  Prediabetes: Amenable to cutting back on carbohydrates and diet to lose weight. We can recheck this in 3 months.  Laryngeal pharyngeal reflux: Doing much better with Protonix twice a day, amenable to adding a second acid blocker.  Past medical history, Surgical history, Family history not pertinant except as noted below, Social history, Allergies, and medications have been entered into the medical record, reviewed, and no changes needed.   Review of Systems: No fevers, chills, night sweats, weight loss, chest pain, or shortness of breath.   Objective:    General: Well Developed, well nourished, and in no acute distress.  Neuro: Alert and oriented x3, extra-ocular muscles intact, sensation grossly intact.  HEENT: Normocephalic, atraumatic, pupils equal round reactive to light, neck supple, no masses, no lymphadenopathy, thyroid nonpalpable.  Skin: Warm and dry, no rashes. Cardiac: Regular rate and rhythm, no murmurs rubs or gallops, no lower extremity edema.  Respiratory: Clear to auscultation bilaterally. Not using accessory muscles, speaking in full sentences.  Impression and Recommendations:

## 2015-03-24 ENCOUNTER — Encounter: Payer: Self-pay | Admitting: Sports Medicine

## 2015-03-24 ENCOUNTER — Ambulatory Visit (INDEPENDENT_AMBULATORY_CARE_PROVIDER_SITE_OTHER): Payer: BC Managed Care – PPO | Admitting: Sports Medicine

## 2015-03-24 VITALS — BP 109/82 | HR 105 | Wt 227.0 lb

## 2015-03-24 DIAGNOSIS — E785 Hyperlipidemia, unspecified: Secondary | ICD-10-CM | POA: Diagnosis not present

## 2015-03-24 DIAGNOSIS — F418 Other specified anxiety disorders: Secondary | ICD-10-CM

## 2015-03-24 DIAGNOSIS — R7303 Prediabetes: Secondary | ICD-10-CM

## 2015-03-24 DIAGNOSIS — B070 Plantar wart: Secondary | ICD-10-CM

## 2015-03-24 DIAGNOSIS — K219 Gastro-esophageal reflux disease without esophagitis: Secondary | ICD-10-CM | POA: Diagnosis not present

## 2015-03-24 DIAGNOSIS — R7309 Other abnormal glucose: Secondary | ICD-10-CM | POA: Diagnosis not present

## 2015-03-24 NOTE — Assessment & Plan Note (Signed)
After failed cryotherapy performed an excision today. Return as needed.

## 2015-03-24 NOTE — Assessment & Plan Note (Signed)
We can recheck this in 2 months.

## 2015-03-24 NOTE — Assessment & Plan Note (Signed)
Controlled on current medications 

## 2015-03-24 NOTE — Assessment & Plan Note (Signed)
Continue current medications. Patient would like me to check her liver function.

## 2015-03-24 NOTE — Progress Notes (Signed)
  Subjective:    CC: Follow-up  HPI: Myofascial pain: Well-controlled on current medications, has discontinued venlafaxine.  Plantar wart: Improved with cryotherapy however still present.  Hyperlipidemia: A friend told her that it worsened her liver enzymes, she would like me to check her liver enzymes.  Past medical history, Surgical history, Family history not pertinant except as noted below, Social history, Allergies, and medications have been entered into the medical record, reviewed, and no changes needed.   Review of Systems: No fevers, chills, night sweats, weight loss, chest pain, or shortness of breath.   Objective:    General: Well Developed, well nourished, and in no acute distress.  Neuro: Alert and oriented x3, extra-ocular muscles intact, sensation grossly intact.  HEENT: Normocephalic, atraumatic, pupils equal round reactive to light, neck supple, no masses, no lymphadenopathy, thyroid nonpalpable.  Skin: Warm and dry, no rashes. Cardiac: Regular rate and rhythm, no murmurs rubs or gallops, no lower extremity edema.  Respiratory: Clear to auscultation bilaterally. Not using accessory muscles, speaking in full sentences. Right foot: Visible plantar wart looks much better than before.  Procedure:  Excision of right foot plantar wart Risks, benefits, and alternatives explained and consent obtained. Time out conducted. Surface prepped with alcohol. 5cc lidocaine with epinephine infiltrated in a field block. Adequate anesthesia ensured. Area prepped and draped in a sterile fashion. Excision performed with: Using a 15 blade I cored out the lesion, and then used electrocautery for hemostasis. Hemostasis achieved. Pt stable.  Impression and Recommendations:

## 2015-03-24 NOTE — Assessment & Plan Note (Signed)
Taking Ambien and trazodone, occasional clonazepam and gabapentin. She has discontinued Effexor, and feels like her symptoms are under good control. No changes.

## 2015-03-28 ENCOUNTER — Telehealth: Payer: Self-pay | Admitting: Sports Medicine

## 2015-03-28 NOTE — Telephone Encounter (Signed)
Received Fax for Pa on Zolpidem Tartrate 10 mg sent through cover my meds and waiting on auth. - CF

## 2015-04-08 NOTE — Telephone Encounter (Signed)
[facility] and they are sending to medical director for further review and we should have an answer in 1 business day. - CF

## 2015-04-11 NOTE — Telephone Encounter (Signed)
[facility] and they denied zolpidem. - CF

## 2015-04-21 ENCOUNTER — Telehealth: Payer: Self-pay | Admitting: Sports Medicine

## 2015-04-21 NOTE — Telephone Encounter (Signed)
[facility] with more clinical questions for Ambien filled out form, Dr T signed and I faxed it today waiting on auth. - CF

## 2015-04-27 NOTE — Telephone Encounter (Signed)
[facility] and Ambien has been approved from 04/05/15-04/25/2016. Case ID [id number] - CF

## 2015-05-02 ENCOUNTER — Other Ambulatory Visit: Payer: Self-pay | Admitting: Sports Medicine

## 2015-05-02 MED ORDER — CLONAZEPAM 1 MG PO TABS: 1.0000 mg | ORAL_TABLET | Freq: Two times a day (BID) | ORAL | Status: DC

## 2015-05-04 ENCOUNTER — Other Ambulatory Visit: Payer: Self-pay | Admitting: Sports Medicine

## 2015-05-04 MED ORDER — CLONAZEPAM 1 MG PO TABS: 1.0000 mg | ORAL_TABLET | Freq: Two times a day (BID) | ORAL | Status: DC

## 2015-05-04 NOTE — Telephone Encounter (Signed)
Received fax requesting Rx be changed from 1 month with 2 refills to 90 day supply.

## 2015-05-05 ENCOUNTER — Other Ambulatory Visit: Payer: Self-pay | Admitting: Sports Medicine

## 2015-05-05 MED ORDER — ZOLPIDEM TARTRATE 10 MG PO TABS: 10.0000 mg | ORAL_TABLET | Freq: Every day | ORAL | Status: DC

## 2015-05-15 ENCOUNTER — Other Ambulatory Visit: Payer: Self-pay | Admitting: Sports Medicine

## 2015-05-24 ENCOUNTER — Ambulatory Visit: Payer: Self-pay | Admitting: Sports Medicine

## 2015-05-27 ENCOUNTER — Encounter: Payer: Self-pay | Admitting: Sports Medicine

## 2015-05-27 ENCOUNTER — Ambulatory Visit (INDEPENDENT_AMBULATORY_CARE_PROVIDER_SITE_OTHER): Payer: BC Managed Care – PPO | Admitting: Sports Medicine

## 2015-05-27 VITALS — BP 119/83 | HR 65 | Ht 65.0 in | Wt 228.0 lb

## 2015-05-27 DIAGNOSIS — Z96652 Presence of left artificial knee joint: Secondary | ICD-10-CM

## 2015-05-27 DIAGNOSIS — I10 Essential (primary) hypertension: Secondary | ICD-10-CM | POA: Diagnosis not present

## 2015-05-27 DIAGNOSIS — E785 Hyperlipidemia, unspecified: Secondary | ICD-10-CM

## 2015-05-27 DIAGNOSIS — R7309 Other abnormal glucose: Secondary | ICD-10-CM

## 2015-05-27 DIAGNOSIS — T8484XS Pain due to internal orthopedic prosthetic devices, implants and grafts, sequela: Secondary | ICD-10-CM

## 2015-05-27 DIAGNOSIS — R7303 Prediabetes: Secondary | ICD-10-CM

## 2015-05-27 LAB — POCT GLYCOSYLATED HEMOGLOBIN (HGB A1C): Hemoglobin A1C: 5.9

## 2015-05-27 MED ORDER — EPINEPHRINE 0.3 MG/0.3ML IJ SOAJ: 0.3000 mg | Freq: Once | INTRAMUSCULAR | Status: DC

## 2015-05-27 NOTE — Progress Notes (Signed)
  Subjective:    CC: Follow-up  HPI: Prediabetes: Hemoglobin A1c is approximately the same as prior.  Left total knee arthroplasty: Still getting some mechanical symptoms under her kneecap, on x-rays by her orthopedic surgeon there is no sign of loosening of the hardware, I have asked her to follow up with him regarding her mechanical symptoms which are painful.  Hypertension: Well controlled  Hyperlipidemia: Well controlled  Preventive measures: Up-to-date on all screening measures.  Past medical history, Surgical history, Family history not pertinant except as noted below, Social history, Allergies, and medications have been entered into the medical record, reviewed, and no changes needed.   Review of Systems: No fevers, chills, night sweats, weight loss, chest pain, or shortness of breath.   Objective:    General: Well Developed, well nourished, and in no acute distress.  Neuro: Alert and oriented x3, extra-ocular muscles intact, sensation grossly intact.  HEENT: Normocephalic, atraumatic, pupils equal round reactive to light, neck supple, no masses, no lymphadenopathy, thyroid nonpalpable.  Skin: Warm and dry, no rashes. Cardiac: Regular rate and rhythm, no murmurs rubs or gallops, no lower extremity edema.  Respiratory: Clear to auscultation bilaterally. Not using accessory muscles, speaking in full sentences.  Impression and Recommendations:

## 2015-05-27 NOTE — Assessment & Plan Note (Signed)
Well controlled, no changes 

## 2015-05-27 NOTE — Assessment & Plan Note (Signed)
Overall doing well post left total knee arthroplasty revision after loosening of the components. She does have some patellofemoral clunking, likely due to alignment of the patellar part of the prosthesis. I have asked her to discuss this again with her orthopedic surgeon, the clunk is painful.

## 2015-05-27 NOTE — Assessment & Plan Note (Signed)
Well controlled, no changes 

## 2015-05-27 NOTE — Assessment & Plan Note (Addendum)
Hemoglobin A1c essentially unchanged, no changes in plan.

## 2015-05-31 ENCOUNTER — Other Ambulatory Visit: Payer: Self-pay | Admitting: Sports Medicine

## 2015-06-20 ENCOUNTER — Other Ambulatory Visit: Payer: Self-pay | Admitting: Sports Medicine

## 2015-06-20 DIAGNOSIS — M26629 Arthralgia of temporomandibular joint, unspecified side: Secondary | ICD-10-CM

## 2015-06-20 MED ORDER — TRAMADOL HCL 50 MG PO TABS: ORAL_TABLET | ORAL | Status: DC

## 2015-07-25 ENCOUNTER — Telehealth: Payer: Self-pay

## 2015-07-25 MED ORDER — ZOLPIDEM TARTRATE 10 MG PO TABS: 10.0000 mg | ORAL_TABLET | Freq: Every day | ORAL | Status: DC

## 2015-07-25 NOTE — Telephone Encounter (Signed)
Patient request refill for ambien # [id number] Refills faxed to [facility]. Patient last appt was 03/2015 and was told to follow up in 1 year. Gustavus Haskin,CMA

## 2015-08-16 ENCOUNTER — Other Ambulatory Visit: Payer: Self-pay | Admitting: Sports Medicine

## 2015-08-16 DIAGNOSIS — M26629 Arthralgia of temporomandibular joint, unspecified side: Secondary | ICD-10-CM

## 2015-08-16 MED ORDER — CLONAZEPAM 1 MG PO TABS: 1.0000 mg | ORAL_TABLET | Freq: Two times a day (BID) | ORAL | Status: DC

## 2015-08-16 MED ORDER — TRAMADOL HCL 50 MG PO TABS: ORAL_TABLET | ORAL | Status: DC

## 2015-08-31 ENCOUNTER — Ambulatory Visit (INDEPENDENT_AMBULATORY_CARE_PROVIDER_SITE_OTHER): Payer: BC Managed Care – PPO | Admitting: Sports Medicine

## 2015-08-31 VITALS — BP 110/77 | HR 81 | Temp 97.8°F | Wt 231.0 lb

## 2015-08-31 DIAGNOSIS — Z23 Encounter for immunization: Secondary | ICD-10-CM | POA: Diagnosis not present

## 2015-08-31 NOTE — Progress Notes (Signed)
Patient came into clinic today for her Zostavax. Pt states she does want to get her flu shot too but will wait a couple weeks in between. Pt tolerated injection in right deltoid with no immediate complications. Advised to schedule a nurse visit when she was ready for her flu shot. Verbalized understanding, no further questions.

## 2015-09-08 ENCOUNTER — Other Ambulatory Visit: Payer: Self-pay | Admitting: Sports Medicine

## 2015-09-21 ENCOUNTER — Ambulatory Visit (INDEPENDENT_AMBULATORY_CARE_PROVIDER_SITE_OTHER): Payer: BC Managed Care – PPO | Admitting: Family Medicine

## 2015-09-21 VITALS — Temp 98.2°F

## 2015-09-21 DIAGNOSIS — Z23 Encounter for immunization: Secondary | ICD-10-CM | POA: Diagnosis not present

## 2015-09-21 NOTE — Progress Notes (Signed)
   Subjective:    Patient ID: Sara [name], female    DOB: 1951/10/19, 64 y.o.   MRN: [id number]  HPI  Sara [name] is here for a flu vaccine.   Review of Systems     Objective:   Physical Exam        Assessment & Plan:  Patient tolerated injection well without complications.

## 2015-10-05 DIAGNOSIS — M1711 Unilateral primary osteoarthritis, right knee: Secondary | ICD-10-CM | POA: Diagnosis not present

## 2015-10-05 DIAGNOSIS — T8482XD Fibrosis due to internal orthopedic prosthetic devices, implants and grafts, subsequent encounter: Secondary | ICD-10-CM | POA: Diagnosis not present

## 2015-10-05 DIAGNOSIS — Z96652 Presence of left artificial knee joint: Secondary | ICD-10-CM | POA: Diagnosis not present

## 2015-10-05 DIAGNOSIS — M1611 Unilateral primary osteoarthritis, right hip: Secondary | ICD-10-CM | POA: Diagnosis not present

## 2015-10-05 DIAGNOSIS — E6609 Other obesity due to excess calories: Secondary | ICD-10-CM | POA: Diagnosis not present

## 2015-10-07 ENCOUNTER — Ambulatory Visit (INDEPENDENT_AMBULATORY_CARE_PROVIDER_SITE_OTHER): Payer: Medicare Other | Admitting: Sports Medicine

## 2015-10-07 ENCOUNTER — Encounter: Payer: Self-pay | Admitting: Sports Medicine

## 2015-10-07 VITALS — BP 115/73 | HR 104 | Ht 65.0 in | Wt 230.0 lb

## 2015-10-07 DIAGNOSIS — Z13820 Encounter for screening for osteoporosis: Secondary | ICD-10-CM

## 2015-10-07 DIAGNOSIS — R7303 Prediabetes: Secondary | ICD-10-CM | POA: Diagnosis not present

## 2015-10-07 DIAGNOSIS — R635 Abnormal weight gain: Secondary | ICD-10-CM | POA: Diagnosis not present

## 2015-10-07 DIAGNOSIS — R7989 Other specified abnormal findings of blood chemistry: Secondary | ICD-10-CM | POA: Diagnosis not present

## 2015-10-07 DIAGNOSIS — R7309 Other abnormal glucose: Secondary | ICD-10-CM | POA: Diagnosis not present

## 2015-10-07 DIAGNOSIS — I10 Essential (primary) hypertension: Secondary | ICD-10-CM | POA: Diagnosis not present

## 2015-10-07 DIAGNOSIS — D649 Anemia, unspecified: Secondary | ICD-10-CM

## 2015-10-07 DIAGNOSIS — E785 Hyperlipidemia, unspecified: Secondary | ICD-10-CM

## 2015-10-07 LAB — CBC
HCT: 40 % (ref 36.0–46.0)
Hemoglobin: 13.6 g/dL (ref 12.0–15.0)
MCH: 30.7 pg (ref 26.0–34.0)
MCHC: 34 g/dL (ref 30.0–36.0)
MCV: 90.3 fL (ref 78.0–100.0)
MPV: 9.2 fL (ref 8.6–12.4)
Platelets: 321 K/uL (ref 150–400)
RBC: 4.43 MIL/uL (ref 3.87–5.11)
RDW: 13.2 % (ref 11.5–15.5)
WBC: 9.5 K/uL (ref 4.0–10.5)

## 2015-10-07 LAB — LIPID PANEL
Cholesterol: 121 mg/dL — ABNORMAL LOW (ref 125–200)
HDL: 59 mg/dL (ref >=46)
LDL Cholesterol: 53 mg/dL (ref <130)
Total CHOL/HDL Ratio: 2.1 ratio (ref <=5.0)
Triglycerides: 46 mg/dL (ref <150)
VLDL: 9 mg/dL (ref <30)

## 2015-10-07 LAB — COMPREHENSIVE METABOLIC PANEL
Albumin: 4.4 g/dL (ref 3.6–5.1)
Chloride: 101 mmol/L (ref 98–110)
Creat: 1.09 mg/dL — ABNORMAL HIGH (ref 0.50–0.99)
Potassium: 3.8 mmol/L (ref 3.5–5.3)
Sodium: 140 mmol/L (ref 135–146)
Total Protein: 7 g/dL (ref 6.1–8.1)

## 2015-10-07 LAB — COMPREHENSIVE METABOLIC PANEL WITH GFR
ALT: 12 U/L (ref 6–29)
AST: 17 U/L (ref 10–35)
Alkaline Phosphatase: 63 U/L (ref 33–130)
BUN: 15 mg/dL (ref 7–25)
CO2: 29 mmol/L (ref 20–31)
Calcium: 9.2 mg/dL (ref 8.6–10.4)
Glucose, Bld: 86 mg/dL (ref 65–99)
Total Bilirubin: 0.7 mg/dL (ref 0.2–1.2)

## 2015-10-07 LAB — HEMOGLOBIN A1C
Hgb A1c MFr Bld: 6 % — ABNORMAL HIGH (ref <5.7)
Mean Plasma Glucose: 126 mg/dL — ABNORMAL HIGH (ref <117)

## 2015-10-07 MED ORDER — PHENTERMINE HCL 37.5 MG PO TABS: ORAL_TABLET | ORAL | Status: DC

## 2015-10-07 MED ORDER — LIRAGLUTIDE 18 MG/3ML ~~LOC~~ SOPN: PEN_INJECTOR | SUBCUTANEOUS | Status: DC

## 2015-10-07 NOTE — Assessment & Plan Note (Signed)
Phentermine, Victoza, referral to nutrition. Return monthly for weight checks and refills.

## 2015-10-07 NOTE — Assessment & Plan Note (Signed)
Starting phentermine and Victoza. We may need to try a few different medications until we get a covered GLP-1 agonist. Return monthly for weight checks and refills.

## 2015-10-07 NOTE — Progress Notes (Signed)
  Subjective:    CC: follow-up  HPI: Diabetes: Needs some routine blood work.  Obesity: Would like help with weight loss.  Past medical history, Surgical history, Family history not pertinant except as noted below, Social history, Allergies, and medications have been entered into the medical record, reviewed, and no changes needed.   Review of Systems: No fevers, chills, night sweats, weight loss, chest pain, or shortness of breath.   Objective:    General: Well Developed, well nourished, and in no acute distress.  Neuro: Alert and oriented x3, extra-ocular muscles intact, sensation grossly intact.  HEENT: Normocephalic, atraumatic, pupils equal round reactive to light, neck supple, no masses, no lymphadenopathy, thyroid nonpalpable.  Skin: Warm and dry, no rashes. Cardiac: Regular rate and rhythm, no murmurs rubs or gallops, no lower extremity edema.  Respiratory: Clear to auscultation bilaterally. Not using accessory muscles, speaking in full sentences.  Impression and Recommendations:    I spent 25 minutes with this patient, greater than 50% was face-to-face time counseling regarding the above diagnoses

## 2015-10-08 LAB — TSH: TSH: 1.702 u[IU]/mL (ref 0.350–4.500)

## 2015-10-08 LAB — VITAMIN D 25 HYDROXY (VIT D DEFICIENCY, FRACTURES): Vit D, 25-Hydroxy: 32 ng/mL (ref 30–100)

## 2015-10-08 NOTE — Addendum Note (Signed)
Addended by: Silverio Decamp on: 10/08/2015 12:31 PM   Modules accepted: Orders

## 2015-10-08 NOTE — Assessment & Plan Note (Signed)
Lipids are a bit low, decreasing lipitor to 10mg  daily.

## 2015-10-10 DIAGNOSIS — T8482XD Fibrosis due to internal orthopedic prosthetic devices, implants and grafts, subsequent encounter: Secondary | ICD-10-CM | POA: Diagnosis not present

## 2015-10-10 DIAGNOSIS — Z471 Aftercare following joint replacement surgery: Secondary | ICD-10-CM | POA: Diagnosis not present

## 2015-10-10 DIAGNOSIS — M47816 Spondylosis without myelopathy or radiculopathy, lumbar region: Secondary | ICD-10-CM | POA: Diagnosis not present

## 2015-10-10 DIAGNOSIS — M1711 Unilateral primary osteoarthritis, right knee: Secondary | ICD-10-CM | POA: Diagnosis not present

## 2015-10-10 DIAGNOSIS — Z96652 Presence of left artificial knee joint: Secondary | ICD-10-CM | POA: Diagnosis not present

## 2015-10-11 ENCOUNTER — Telehealth: Payer: Self-pay | Admitting: Sports Medicine

## 2015-10-11 NOTE — Telephone Encounter (Signed)
We discussed this but she may have forgotten, nausea is normal and she needs to push through it, it goes away after a couple days and its easy to just take a zofran of phenergan in the beginning.  She needs to restart it.

## 2015-10-11 NOTE — Telephone Encounter (Signed)
Patient wanted you to know she canceled her 1 mo f/u because she can not take that Wt loss med and it makes her sick. She just wanted Dr. [name] to know what was going on. Thanks, [employer]

## 2015-10-13 NOTE — Telephone Encounter (Signed)
I called pt back to let her know what you said about the nausea as you both had discussed, but she states she can not do it and still made an appointment with the nutritional dept and says thank you so very much to Dr.T

## 2015-10-17 DIAGNOSIS — Z6838 Body mass index (BMI) 38.0-38.9, adult: Secondary | ICD-10-CM | POA: Diagnosis not present

## 2015-10-17 DIAGNOSIS — G894 Chronic pain syndrome: Secondary | ICD-10-CM | POA: Diagnosis not present

## 2015-10-17 DIAGNOSIS — R635 Abnormal weight gain: Secondary | ICD-10-CM | POA: Diagnosis not present

## 2015-10-17 DIAGNOSIS — M25562 Pain in left knee: Secondary | ICD-10-CM | POA: Diagnosis not present

## 2015-10-17 DIAGNOSIS — G8929 Other chronic pain: Secondary | ICD-10-CM | POA: Diagnosis not present

## 2015-10-18 DIAGNOSIS — K219 Gastro-esophageal reflux disease without esophagitis: Secondary | ICD-10-CM | POA: Diagnosis not present

## 2015-10-18 DIAGNOSIS — R079 Chest pain, unspecified: Secondary | ICD-10-CM | POA: Diagnosis not present

## 2015-10-19 ENCOUNTER — Other Ambulatory Visit: Payer: Self-pay | Admitting: Sports Medicine

## 2015-10-31 DIAGNOSIS — E669 Obesity, unspecified: Secondary | ICD-10-CM | POA: Diagnosis not present

## 2015-11-04 ENCOUNTER — Ambulatory Visit: Payer: Self-pay | Admitting: Sports Medicine

## 2015-11-14 ENCOUNTER — Other Ambulatory Visit: Payer: Self-pay | Admitting: Sports Medicine

## 2015-11-14 DIAGNOSIS — M26623 Arthralgia of bilateral temporomandibular joint: Secondary | ICD-10-CM

## 2015-11-14 MED ORDER — TRAMADOL HCL 50 MG PO TABS: ORAL_TABLET | ORAL | Status: DC

## 2015-11-14 MED ORDER — CLONAZEPAM 1 MG PO TABS: 1.0000 mg | ORAL_TABLET | Freq: Two times a day (BID) | ORAL | Status: DC

## 2015-11-14 MED ORDER — GABAPENTIN 800 MG PO TABS: 800.0000 mg | ORAL_TABLET | Freq: Three times a day (TID) | ORAL | Status: DC

## 2015-11-18 ENCOUNTER — Other Ambulatory Visit: Payer: Self-pay | Admitting: Sports Medicine

## 2015-11-29 DIAGNOSIS — E669 Obesity, unspecified: Secondary | ICD-10-CM | POA: Diagnosis not present

## 2015-11-29 DIAGNOSIS — Z6831 Body mass index (BMI) 31.0-31.9, adult: Secondary | ICD-10-CM | POA: Diagnosis not present

## 2015-11-30 ENCOUNTER — Telehealth: Payer: Self-pay

## 2015-11-30 NOTE — Telephone Encounter (Signed)
[name] called and left a message complaining of body aches and pains. She states her vitamin D was low and has tried OTC vitamin D without any relief of her symptoms. She wants to know if low vitamin D could cause body aches and pain. If so she would like the prescription strength vitamin D. Please advise.

## 2015-12-01 NOTE — Telephone Encounter (Signed)
Its not, likely a virus.  Should be seen if patient sufficiently concerned.

## 2015-12-01 NOTE — Telephone Encounter (Signed)
Left detailed message.   

## 2016-01-04 ENCOUNTER — Ambulatory Visit (INDEPENDENT_AMBULATORY_CARE_PROVIDER_SITE_OTHER): Payer: Medicare Other | Admitting: Sports Medicine

## 2016-01-04 ENCOUNTER — Encounter: Payer: Self-pay | Admitting: Sports Medicine

## 2016-01-04 VITALS — BP 119/79 | HR 89 | Resp 18 | Wt 234.6 lb

## 2016-01-04 DIAGNOSIS — F418 Other specified anxiety disorders: Secondary | ICD-10-CM

## 2016-01-04 DIAGNOSIS — R635 Abnormal weight gain: Secondary | ICD-10-CM | POA: Diagnosis not present

## 2016-01-04 DIAGNOSIS — H8113 Benign paroxysmal vertigo, bilateral: Secondary | ICD-10-CM

## 2016-01-04 DIAGNOSIS — D649 Anemia, unspecified: Secondary | ICD-10-CM | POA: Diagnosis not present

## 2016-01-04 DIAGNOSIS — R7303 Prediabetes: Secondary | ICD-10-CM

## 2016-01-04 DIAGNOSIS — J4521 Mild intermittent asthma with (acute) exacerbation: Secondary | ICD-10-CM

## 2016-01-04 DIAGNOSIS — R7309 Other abnormal glucose: Secondary | ICD-10-CM | POA: Diagnosis not present

## 2016-01-04 DIAGNOSIS — I10 Essential (primary) hypertension: Secondary | ICD-10-CM

## 2016-01-04 DIAGNOSIS — E785 Hyperlipidemia, unspecified: Secondary | ICD-10-CM | POA: Diagnosis not present

## 2016-01-04 DIAGNOSIS — M160 Bilateral primary osteoarthritis of hip: Secondary | ICD-10-CM

## 2016-01-04 DIAGNOSIS — R799 Abnormal finding of blood chemistry, unspecified: Secondary | ICD-10-CM | POA: Diagnosis not present

## 2016-01-04 DIAGNOSIS — H8110 Benign paroxysmal vertigo, unspecified ear: Secondary | ICD-10-CM | POA: Insufficient documentation

## 2016-01-04 DIAGNOSIS — R7989 Other specified abnormal findings of blood chemistry: Secondary | ICD-10-CM | POA: Diagnosis not present

## 2016-01-04 LAB — CBC
HCT: 39.6 % (ref 36.0–46.0)
Hemoglobin: 13.3 g/dL (ref 12.0–15.0)
MCH: 30.8 pg (ref 26.0–34.0)
MCHC: 33.6 g/dL (ref 30.0–36.0)
MCV: 91.7 fL (ref 78.0–100.0)
MPV: 9.1 fL (ref 8.6–12.4)
Platelets: 350 10*3/uL (ref 150–400)
RBC: 4.32 MIL/uL (ref 3.87–5.11)
RDW: 13.7 % (ref 11.5–15.5)
WBC: 11.5 10*3/uL — ABNORMAL HIGH (ref 4.0–10.5)

## 2016-01-04 LAB — POCT GLYCOSYLATED HEMOGLOBIN (HGB A1C): Hemoglobin A1C: 6.1

## 2016-01-04 MED ORDER — ALBUTEROL SULFATE HFA 108 (90 BASE) MCG/ACT IN AERS: 2.0000 | INHALATION_SPRAY | RESPIRATORY_TRACT | Status: DC | PRN

## 2016-01-04 MED ORDER — DIAZEPAM 5 MG PO TABS: ORAL_TABLET | ORAL | Status: DC

## 2016-01-04 MED ORDER — MELOXICAM 15 MG PO TABS
ORAL_TABLET | ORAL | Status: DC
Start: 2016-01-04 — End: 2016-05-07

## 2016-01-04 MED ORDER — CALCIUM CARBONATE-VITAMIN D 600-400 MG-UNIT PO TABS: 1.0000 | ORAL_TABLET | Freq: Two times a day (BID) | ORAL | Status: DC

## 2016-01-04 NOTE — Patient Instructions (Signed)
Benign Positional Vertigo Vertigo is the feeling that you or your surroundings are moving when they are not. Benign positional vertigo is the most common form of vertigo. The cause of this condition is not serious (is benign). This condition is triggered by certain movements and positions (is positional). This condition can be dangerous if it occurs while you are doing something that could endanger you or others, such as driving.  CAUSES In many cases, the cause of this condition is not known. It may be caused by a disturbance in an area of the inner ear that helps your brain to sense movement and balance. This disturbance can be caused by a viral infection (labyrinthitis), head injury, or repetitive motion. RISK FACTORS This condition is more likely to develop in:  Women.  People who are 50 years of age or older. SYMPTOMS Symptoms of this condition usually happen when you move your head or your eyes in different directions. Symptoms may start suddenly, and they usually last for less than a minute. Symptoms may include:  Loss of balance and falling.  Feeling like you are spinning or moving.  Feeling like your surroundings are spinning or moving.  Nausea and vomiting.  Blurred vision.  Dizziness.  Involuntary eye movement (nystagmus). Symptoms can be mild and cause only slight annoyance, or they can be severe and interfere with daily life. Episodes of benign positional vertigo may return (recur) over time, and they may be triggered by certain movements. Symptoms may improve over time. DIAGNOSIS This condition is usually diagnosed by medical history and a physical exam of the head, neck, and ears. You may be referred to a health care provider who specializes in ear, nose, and throat (ENT) problems (otolaryngologist) or a provider who specializes in disorders of the nervous system (neurologist). You may have additional testing, including:  MRI.  A CT scan.  Eye movement tests. Your  health care provider may ask you to change positions quickly while he or she watches you for symptoms of benign positional vertigo, such as nystagmus. Eye movement may be tested with an electronystagmogram (ENG), caloric stimulation, the Dix-Hallpike test, or the roll test.  An electroencephalogram (EEG). This records electrical activity in your brain.  Hearing tests. TREATMENT Usually, your health care provider will treat this by moving your head in specific positions to adjust your inner ear back to normal. Surgery may be needed in severe cases, but this is rare. In some cases, benign positional vertigo may resolve on its own in 2-4 weeks. HOME CARE INSTRUCTIONS Safety  Move slowly.Avoid sudden body or head movements.  Avoid driving.  Avoid operating heavy machinery.  Avoid doing any tasks that would be dangerous to you or others if a vertigo episode would occur.  If you have trouble walking or keeping your balance, try using a cane for stability. If you feel dizzy or unstable, sit down right away.  Return to your normal activities as told by your health care provider. Ask your health care provider what activities are safe for you. General Instructions  Take over-the-counter and prescription medicines only as told by your health care provider.  Avoid certain positions or movements as told by your health care provider.  Drink enough fluid to keep your urine clear or pale yellow.  Keep all follow-up visits as told by your health care provider. This is important. SEEK MEDICAL CARE IF:  You have a fever.  Your condition gets worse or you develop new symptoms.  Your family or friends   notice any behavioral changes.  Your nausea or vomiting gets worse.  You have numbness or a "pins and needles" sensation. SEEK IMMEDIATE MEDICAL CARE IF:  You have difficulty speaking or moving.  You are always dizzy.  You faint.  You develop severe headaches.  You have weakness in your  legs or arms.  You have changes in your hearing or vision.  You develop a stiff neck.  You develop sensitivity to light.   This information is not intended to replace advice given to you by your health care provider. Make sure you discuss any questions you have with your health care provider.   Document Released: 09/24/2006 Document Revised: 09/07/2015 Document Reviewed: 04/11/2015 Elsevier Interactive Patient Education 2016 [employer]  

## 2016-01-04 NOTE — Assessment & Plan Note (Signed)
Single episode,seems to have resolved. I am going to switch her to Valium instead of Klonopin, she will use up to 30 per month.

## 2016-01-04 NOTE — Assessment & Plan Note (Signed)
X-ray confirmed, adding meloxicam, return if no better in one month for bilateral hip joint injections.

## 2016-01-04 NOTE — Progress Notes (Signed)
  Subjective:    CC: follow-up  HPI: Prediabetes: Is working aggressively on weight loss, did not even try the Victoza injections to lose weight.  Hypertension: Controlled  Hyperlipidemia: Due for a recheck  Hip pain: Bilateral, right worse than left and localized at the groin with gelling, x-rays at an outside office did show hip osteoarthritis. Not taking any NSAIDs.  Dizziness: Single episode yesterday when she got out of the car, she describes it as more horizontal room spinning rather than vertical spinning or presyncope. No chest pain, no palpitations. No tinnitus, hearing is unremarkable. No upper respiratory symptoms. Symptoms did resolve early this morning.  Past medical history, Surgical history, Family history not pertinant except as noted below, Social history, Allergies, and medications have been entered into the medical record, reviewed, and no changes needed.   Review of Systems: No fevers, chills, night sweats, weight loss, chest pain, or shortness of breath.   Objective:    General: Well Developed, well nourished, and in no acute distress.  Neuro: Alert and oriented x3, extra-ocular muscles intact, sensation grossly intact. Cranial nerves II through XII are intact, motor, sensory, and coordinative functions are all intact. HEENT: Normocephalic, atraumatic, pupils equal round reactive to light, neck supple, no masses, no lymphadenopathy, thyroid nonpalpable.  Skin: Warm and dry, no rashes. Cardiac: Regular rate and rhythm, no murmurs rubs or gallops, no lower extremity edema.  Respiratory: Clear to auscultation bilaterally. Not using accessory muscles, speaking in full sentences. Bilateral hips: ROM IR: 60 Deg, ER: 60 Deg, Flexion: 120 Deg, Extension: 100 Deg, Abduction: 45 Deg, Adduction: 29 Deg, internal rotation reproduces hip pain in the groin. Strength IR: 5/5, ER: 5/5, Flexion: 5/5, Extension: 5/5, Abduction: 5/5, Adduction: 5/5 Pelvic alignment unremarkable to  inspection and palpation. Standing hip rotation and gait without trendelenburg / unsteadiness. Greater trochanter without tenderness to palpation. No tenderness over piriformis. No SI joint tenderness and normal minimal SI movement.  Impression and Recommendations:    I spent 40 minutes with this patient, greater than 50% was face-to-face time counseling regarding the above diagnoses

## 2016-01-04 NOTE — Assessment & Plan Note (Signed)
Did not use Victoza, it's not that she was unable to tolerate it, she was simply scared of doing an injection.

## 2016-01-04 NOTE — Assessment & Plan Note (Signed)
Rechecking lipids today. 

## 2016-01-04 NOTE — Assessment & Plan Note (Signed)
Switching to valium instead of klonopin.

## 2016-01-04 NOTE — Assessment & Plan Note (Signed)
Well controlled, no changes 

## 2016-01-04 NOTE — Assessment & Plan Note (Signed)
Hemoglobin A1c is relatively stable.

## 2016-01-05 ENCOUNTER — Other Ambulatory Visit: Payer: Self-pay | Admitting: Sports Medicine

## 2016-01-05 LAB — COMPREHENSIVE METABOLIC PANEL
ALT: 14 U/L (ref 6–29)
AST: 22 U/L (ref 10–35)
Albumin: 4.1 g/dL (ref 3.6–5.1)
Alkaline Phosphatase: 62 U/L (ref 33–130)
Calcium: 9.1 mg/dL (ref 8.6–10.4)
Potassium: 4.2 mmol/L (ref 3.5–5.3)
Total Bilirubin: 0.4 mg/dL (ref 0.2–1.2)
Total Protein: 6.6 g/dL (ref 6.1–8.1)

## 2016-01-05 LAB — COMPREHENSIVE METABOLIC PANEL WITH GFR
BUN: 13 mg/dL (ref 7–25)
CO2: 22 mmol/L (ref 20–31)
Chloride: 105 mmol/L (ref 98–110)
Creat: 0.87 mg/dL (ref 0.50–0.99)
Glucose, Bld: 91 mg/dL (ref 65–99)
Sodium: 138 mmol/L (ref 135–146)

## 2016-01-05 LAB — LIPID PANEL
Cholesterol: 122 mg/dL — ABNORMAL LOW (ref 125–200)
HDL: 55 mg/dL (ref >=46)
LDL Cholesterol: 55 mg/dL (ref <130)
Total CHOL/HDL Ratio: 2.2 ratio (ref <=5.0)
Triglycerides: 59 mg/dL (ref <150)
VLDL: 12 mg/dL (ref <30)

## 2016-01-05 LAB — HEMOGLOBIN A1C
Hgb A1c MFr Bld: 5.8 % — ABNORMAL HIGH (ref <5.7)
Mean Plasma Glucose: 120 mg/dL — ABNORMAL HIGH (ref <117)

## 2016-01-05 LAB — TSH: TSH: 2.642 u[IU]/mL (ref 0.350–4.500)

## 2016-01-05 LAB — VITAMIN D 25 HYDROXY (VIT D DEFICIENCY, FRACTURES): Vit D, 25-Hydroxy: 29 ng/mL — ABNORMAL LOW (ref 30–100)

## 2016-01-05 MED ORDER — VITAMIN D (ERGOCALCIFEROL) 1.25 MG (50000 UNIT) PO CAPS: 50000.0000 [IU] | ORAL_CAPSULE | ORAL | Status: DC

## 2016-01-13 DIAGNOSIS — T8482XD Fibrosis due to internal orthopedic prosthetic devices, implants and grafts, subsequent encounter: Secondary | ICD-10-CM | POA: Diagnosis not present

## 2016-01-13 DIAGNOSIS — Z96652 Presence of left artificial knee joint: Secondary | ICD-10-CM | POA: Diagnosis not present

## 2016-01-13 DIAGNOSIS — M25862 Other specified joint disorders, left knee: Secondary | ICD-10-CM | POA: Diagnosis not present

## 2016-01-13 DIAGNOSIS — M25562 Pain in left knee: Secondary | ICD-10-CM | POA: Diagnosis not present

## 2016-01-13 DIAGNOSIS — G8929 Other chronic pain: Secondary | ICD-10-CM | POA: Diagnosis not present

## 2016-01-13 DIAGNOSIS — Z471 Aftercare following joint replacement surgery: Secondary | ICD-10-CM | POA: Diagnosis not present

## 2016-01-19 ENCOUNTER — Other Ambulatory Visit: Payer: Self-pay

## 2016-01-19 DIAGNOSIS — E785 Hyperlipidemia, unspecified: Secondary | ICD-10-CM

## 2016-01-19 DIAGNOSIS — M26623 Arthralgia of bilateral temporomandibular joint: Secondary | ICD-10-CM

## 2016-01-19 MED ORDER — TRAMADOL HCL 50 MG PO TABS: ORAL_TABLET | ORAL | Status: DC

## 2016-01-19 MED ORDER — METOPROLOL SUCCINATE ER 50 MG PO TB24: ORAL_TABLET | ORAL | Status: DC

## 2016-01-19 MED ORDER — ATORVASTATIN CALCIUM 10 MG PO TABS: 10.0000 mg | ORAL_TABLET | Freq: Every day | ORAL | Status: DC

## 2016-01-19 MED ORDER — LISINOPRIL-HYDROCHLOROTHIAZIDE 20-25 MG PO TABS: ORAL_TABLET | ORAL | Status: DC

## 2016-01-20 ENCOUNTER — Other Ambulatory Visit: Payer: Self-pay | Admitting: Sports Medicine

## 2016-02-01 ENCOUNTER — Ambulatory Visit: Payer: Self-pay | Admitting: Sports Medicine

## 2016-02-02 ENCOUNTER — Ambulatory Visit (INDEPENDENT_AMBULATORY_CARE_PROVIDER_SITE_OTHER): Payer: Medicare Other | Admitting: Sports Medicine

## 2016-02-02 ENCOUNTER — Encounter: Payer: Self-pay | Admitting: Sports Medicine

## 2016-02-02 VITALS — BP 125/81 | HR 100 | Resp 18 | Wt 219.6 lb

## 2016-02-02 DIAGNOSIS — R635 Abnormal weight gain: Secondary | ICD-10-CM

## 2016-02-02 DIAGNOSIS — H8113 Benign paroxysmal vertigo, bilateral: Secondary | ICD-10-CM

## 2016-02-02 DIAGNOSIS — G4700 Insomnia, unspecified: Secondary | ICD-10-CM

## 2016-02-02 DIAGNOSIS — I10 Essential (primary) hypertension: Secondary | ICD-10-CM | POA: Diagnosis not present

## 2016-02-02 MED ORDER — TRAZODONE HCL 150 MG PO TABS: ORAL_TABLET | ORAL | Status: DC

## 2016-02-02 MED ORDER — DIAZEPAM 5 MG PO TABS: ORAL_TABLET | ORAL | Status: DC

## 2016-02-02 MED ORDER — ZOLPIDEM TARTRATE 10 MG PO TABS: 10.0000 mg | ORAL_TABLET | Freq: Every day | ORAL | Status: DC

## 2016-02-02 NOTE — Assessment & Plan Note (Signed)
15 pound weight loss on her own with [employer], not using GLP-1 agonists

## 2016-02-02 NOTE — Progress Notes (Signed)
  Subjective:    CC: Follow-up  HPI: Vertigo: Resolved with occasional Valium, feels much better.  Hypertension: Well controlled  Insomnia: Needs a refill on Ambien and trazodone.  Obesity: Is not using her injections, has lost a good 15 pounds on her own with [employer]. Congratulated.  Past medical history, Surgical history, Family history not pertinant except as noted below, Social history, Allergies, and medications have been entered into the medical record, reviewed, and no changes needed.   Review of Systems: No fevers, chills, night sweats, weight loss, chest pain, or shortness of breath.   Objective:    General: Well Developed, well nourished, and in no acute distress.  Neuro: Alert and oriented x3, extra-ocular muscles intact, sensation grossly intact.  HEENT: Normocephalic, atraumatic, pupils equal round reactive to light, neck supple, no masses, no lymphadenopathy, thyroid nonpalpable.  Skin: Warm and dry, no rashes. Cardiac: Regular rate and rhythm, no murmurs rubs or gallops, no lower extremity edema.  Respiratory: Clear to auscultation bilaterally. Not using accessory muscles, speaking in full sentences.  Impression and Recommendations:    I spent 25 minutes with this patient, greater than 50% was face-to-face time counseling regarding the above diagnoses

## 2016-02-02 NOTE — Assessment & Plan Note (Signed)
Doing well, refilling Valium, #30 per month, 3 month supply to [facility]

## 2016-02-02 NOTE — Assessment & Plan Note (Signed)
Refilling trazodone and Ambien

## 2016-02-02 NOTE — Assessment & Plan Note (Signed)
Well-controlled, no changes needed 

## 2016-02-08 DIAGNOSIS — M25862 Other specified joint disorders, left knee: Secondary | ICD-10-CM | POA: Diagnosis not present

## 2016-02-08 DIAGNOSIS — F329 Major depressive disorder, single episode, unspecified: Secondary | ICD-10-CM | POA: Diagnosis not present

## 2016-02-08 DIAGNOSIS — M659 Synovitis and tenosynovitis, unspecified: Secondary | ICD-10-CM | POA: Diagnosis not present

## 2016-02-08 DIAGNOSIS — M65862 Other synovitis and tenosynovitis, left lower leg: Secondary | ICD-10-CM | POA: Diagnosis not present

## 2016-02-08 DIAGNOSIS — J449 Chronic obstructive pulmonary disease, unspecified: Secondary | ICD-10-CM | POA: Diagnosis not present

## 2016-02-08 DIAGNOSIS — K219 Gastro-esophageal reflux disease without esophagitis: Secondary | ICD-10-CM | POA: Diagnosis not present

## 2016-02-08 DIAGNOSIS — Z87442 Personal history of urinary calculi: Secondary | ICD-10-CM | POA: Diagnosis not present

## 2016-02-08 DIAGNOSIS — R7303 Prediabetes: Secondary | ICD-10-CM | POA: Diagnosis not present

## 2016-02-08 DIAGNOSIS — J45909 Unspecified asthma, uncomplicated: Secondary | ICD-10-CM | POA: Diagnosis not present

## 2016-02-08 DIAGNOSIS — Z86010 Personal history of colonic polyps: Secondary | ICD-10-CM | POA: Diagnosis not present

## 2016-02-08 DIAGNOSIS — S8392XA Sprain of unspecified site of left knee, initial encounter: Secondary | ICD-10-CM | POA: Diagnosis not present

## 2016-02-08 DIAGNOSIS — I10 Essential (primary) hypertension: Secondary | ICD-10-CM | POA: Diagnosis not present

## 2016-02-13 ENCOUNTER — Other Ambulatory Visit: Payer: Self-pay

## 2016-02-13 DIAGNOSIS — G4700 Insomnia, unspecified: Secondary | ICD-10-CM

## 2016-02-13 MED ORDER — ZOLPIDEM TARTRATE 10 MG PO TABS: 10.0000 mg | ORAL_TABLET | Freq: Every day | ORAL | Status: DC

## 2016-02-15 DIAGNOSIS — J45909 Unspecified asthma, uncomplicated: Secondary | ICD-10-CM | POA: Diagnosis not present

## 2016-02-15 DIAGNOSIS — I10 Essential (primary) hypertension: Secondary | ICD-10-CM | POA: Diagnosis not present

## 2016-02-15 DIAGNOSIS — Z9889 Other specified postprocedural states: Secondary | ICD-10-CM | POA: Diagnosis not present

## 2016-02-15 DIAGNOSIS — M25862 Other specified joint disorders, left knee: Secondary | ICD-10-CM | POA: Diagnosis not present

## 2016-02-15 DIAGNOSIS — Z79899 Other long term (current) drug therapy: Secondary | ICD-10-CM | POA: Diagnosis not present

## 2016-02-15 DIAGNOSIS — Z79891 Long term (current) use of opiate analgesic: Secondary | ICD-10-CM | POA: Diagnosis not present

## 2016-02-15 DIAGNOSIS — Z7982 Long term (current) use of aspirin: Secondary | ICD-10-CM | POA: Diagnosis not present

## 2016-02-15 DIAGNOSIS — Z4789 Encounter for other orthopedic aftercare: Secondary | ICD-10-CM | POA: Diagnosis not present

## 2016-02-15 DIAGNOSIS — J449 Chronic obstructive pulmonary disease, unspecified: Secondary | ICD-10-CM | POA: Diagnosis not present

## 2016-02-23 ENCOUNTER — Other Ambulatory Visit: Payer: Self-pay

## 2016-02-23 DIAGNOSIS — M26623 Arthralgia of bilateral temporomandibular joint: Secondary | ICD-10-CM

## 2016-02-23 DIAGNOSIS — M25562 Pain in left knee: Secondary | ICD-10-CM | POA: Diagnosis not present

## 2016-02-23 MED ORDER — GABAPENTIN 800 MG PO TABS: 800.0000 mg | ORAL_TABLET | Freq: Three times a day (TID) | ORAL | Status: DC

## 2016-02-23 MED ORDER — METOPROLOL SUCCINATE ER 50 MG PO TB24: ORAL_TABLET | ORAL | Status: DC

## 2016-02-23 MED ORDER — TRAMADOL HCL 50 MG PO TABS: ORAL_TABLET | ORAL | Status: DC

## 2016-02-23 MED ORDER — VITAMIN D (ERGOCALCIFEROL) 1.25 MG (50000 UNIT) PO CAPS: 50000.0000 [IU] | ORAL_CAPSULE | ORAL | Status: DC

## 2016-02-23 MED ORDER — LISINOPRIL-HYDROCHLOROTHIAZIDE 20-25 MG PO TABS: ORAL_TABLET | ORAL | Status: DC

## 2016-02-24 ENCOUNTER — Telehealth: Payer: Self-pay | Admitting: Sports Medicine

## 2016-02-24 NOTE — Telephone Encounter (Signed)
Received fax for prior authorization on Zolpidem Tartrate filled out form and faxed back. Waiting on authorization. - CF

## 2016-02-26 ENCOUNTER — Other Ambulatory Visit: Payer: Self-pay | Admitting: Sports Medicine

## 2016-02-29 ENCOUNTER — Telehealth: Payer: Self-pay | Admitting: *Deleted

## 2016-02-29 NOTE — Telephone Encounter (Signed)
Zolpidem was approved from 02-24-2016 through 02-23-2019. Patient and pharmacy notified

## 2016-03-01 ENCOUNTER — Ambulatory Visit: Payer: Self-pay | Admitting: Sports Medicine

## 2016-03-01 DIAGNOSIS — M6281 Muscle weakness (generalized): Secondary | ICD-10-CM | POA: Diagnosis not present

## 2016-03-01 DIAGNOSIS — M25562 Pain in left knee: Secondary | ICD-10-CM | POA: Diagnosis not present

## 2016-03-08 DIAGNOSIS — M6281 Muscle weakness (generalized): Secondary | ICD-10-CM | POA: Diagnosis not present

## 2016-03-08 DIAGNOSIS — M25562 Pain in left knee: Secondary | ICD-10-CM | POA: Diagnosis not present

## 2016-03-11 ENCOUNTER — Other Ambulatory Visit: Payer: Self-pay | Admitting: Sports Medicine

## 2016-03-14 DIAGNOSIS — J449 Chronic obstructive pulmonary disease, unspecified: Secondary | ICD-10-CM | POA: Diagnosis not present

## 2016-03-14 DIAGNOSIS — Z7982 Long term (current) use of aspirin: Secondary | ICD-10-CM | POA: Diagnosis not present

## 2016-03-14 DIAGNOSIS — J45909 Unspecified asthma, uncomplicated: Secondary | ICD-10-CM | POA: Diagnosis not present

## 2016-03-14 DIAGNOSIS — I10 Essential (primary) hypertension: Secondary | ICD-10-CM | POA: Diagnosis not present

## 2016-03-14 DIAGNOSIS — Z79899 Other long term (current) drug therapy: Secondary | ICD-10-CM | POA: Diagnosis not present

## 2016-03-14 DIAGNOSIS — Z4789 Encounter for other orthopedic aftercare: Secondary | ICD-10-CM | POA: Diagnosis not present

## 2016-03-16 DIAGNOSIS — M6281 Muscle weakness (generalized): Secondary | ICD-10-CM | POA: Diagnosis not present

## 2016-03-16 DIAGNOSIS — M25562 Pain in left knee: Secondary | ICD-10-CM | POA: Diagnosis not present

## 2016-03-17 ENCOUNTER — Other Ambulatory Visit: Payer: Self-pay | Admitting: Sports Medicine

## 2016-03-19 ENCOUNTER — Other Ambulatory Visit: Payer: Self-pay | Admitting: Sports Medicine

## 2016-03-21 NOTE — Telephone Encounter (Signed)
Zolpidem Tartrate was approved. - CF

## 2016-03-23 DIAGNOSIS — M6281 Muscle weakness (generalized): Secondary | ICD-10-CM | POA: Diagnosis not present

## 2016-03-23 DIAGNOSIS — M25562 Pain in left knee: Secondary | ICD-10-CM | POA: Diagnosis not present

## 2016-04-06 ENCOUNTER — Encounter: Payer: Self-pay | Admitting: Sports Medicine

## 2016-04-06 DIAGNOSIS — Z114 Encounter for screening for human immunodeficiency virus [HIV]: Secondary | ICD-10-CM

## 2016-04-06 DIAGNOSIS — Z1159 Encounter for screening for other viral diseases: Secondary | ICD-10-CM

## 2016-04-09 ENCOUNTER — Other Ambulatory Visit: Payer: Self-pay

## 2016-04-09 MED ORDER — VITAMIN D (ERGOCALCIFEROL) 1.25 MG (50000 UNIT) PO CAPS: 50000.0000 [IU] | ORAL_CAPSULE | ORAL | Status: DC

## 2016-04-24 DIAGNOSIS — K219 Gastro-esophageal reflux disease without esophagitis: Secondary | ICD-10-CM | POA: Diagnosis not present

## 2016-04-24 DIAGNOSIS — R079 Chest pain, unspecified: Secondary | ICD-10-CM | POA: Diagnosis not present

## 2016-04-24 DIAGNOSIS — R1084 Generalized abdominal pain: Secondary | ICD-10-CM | POA: Diagnosis not present

## 2016-04-24 DIAGNOSIS — K5900 Constipation, unspecified: Secondary | ICD-10-CM | POA: Diagnosis not present

## 2016-04-24 DIAGNOSIS — R140 Abdominal distension (gaseous): Secondary | ICD-10-CM | POA: Diagnosis not present

## 2016-04-26 ENCOUNTER — Other Ambulatory Visit: Payer: Self-pay | Admitting: Sports Medicine

## 2016-04-26 DIAGNOSIS — H8113 Benign paroxysmal vertigo, bilateral: Secondary | ICD-10-CM

## 2016-04-26 MED ORDER — DIAZEPAM 5 MG PO TABS: ORAL_TABLET | ORAL | Status: DC

## 2016-05-07 ENCOUNTER — Other Ambulatory Visit: Payer: Self-pay | Admitting: Sports Medicine

## 2016-06-04 DIAGNOSIS — Z86010 Personal history of colonic polyps: Secondary | ICD-10-CM | POA: Diagnosis not present

## 2016-06-04 DIAGNOSIS — R140 Abdominal distension (gaseous): Secondary | ICD-10-CM | POA: Diagnosis not present

## 2016-06-04 DIAGNOSIS — K5900 Constipation, unspecified: Secondary | ICD-10-CM | POA: Diagnosis not present

## 2016-06-04 DIAGNOSIS — K581 Irritable bowel syndrome with constipation: Secondary | ICD-10-CM | POA: Diagnosis not present

## 2016-06-07 DIAGNOSIS — Z791 Long term (current) use of non-steroidal anti-inflammatories (NSAID): Secondary | ICD-10-CM | POA: Diagnosis not present

## 2016-06-07 DIAGNOSIS — Z6837 Body mass index (BMI) 37.0-37.9, adult: Secondary | ICD-10-CM | POA: Diagnosis not present

## 2016-06-07 DIAGNOSIS — M25561 Pain in right knee: Secondary | ICD-10-CM | POA: Diagnosis not present

## 2016-06-07 DIAGNOSIS — Z96652 Presence of left artificial knee joint: Secondary | ICD-10-CM | POA: Diagnosis not present

## 2016-06-07 DIAGNOSIS — J45909 Unspecified asthma, uncomplicated: Secondary | ICD-10-CM | POA: Diagnosis not present

## 2016-06-07 DIAGNOSIS — I10 Essential (primary) hypertension: Secondary | ICD-10-CM | POA: Diagnosis not present

## 2016-06-07 DIAGNOSIS — E785 Hyperlipidemia, unspecified: Secondary | ICD-10-CM | POA: Diagnosis not present

## 2016-06-07 DIAGNOSIS — Z9889 Other specified postprocedural states: Secondary | ICD-10-CM | POA: Diagnosis not present

## 2016-06-07 DIAGNOSIS — Z471 Aftercare following joint replacement surgery: Secondary | ICD-10-CM | POA: Diagnosis not present

## 2016-06-07 DIAGNOSIS — Z7982 Long term (current) use of aspirin: Secondary | ICD-10-CM | POA: Diagnosis not present

## 2016-06-07 DIAGNOSIS — M25462 Effusion, left knee: Secondary | ICD-10-CM | POA: Diagnosis not present

## 2016-06-07 DIAGNOSIS — Z79899 Other long term (current) drug therapy: Secondary | ICD-10-CM | POA: Diagnosis not present

## 2016-06-07 DIAGNOSIS — J449 Chronic obstructive pulmonary disease, unspecified: Secondary | ICD-10-CM | POA: Diagnosis not present

## 2016-06-07 DIAGNOSIS — M1711 Unilateral primary osteoarthritis, right knee: Secondary | ICD-10-CM | POA: Diagnosis not present

## 2016-06-07 DIAGNOSIS — T8482XD Fibrosis due to internal orthopedic prosthetic devices, implants and grafts, subsequent encounter: Secondary | ICD-10-CM | POA: Diagnosis not present

## 2016-06-12 ENCOUNTER — Other Ambulatory Visit: Payer: Self-pay

## 2016-06-12 MED ORDER — POTASSIUM CHLORIDE CRYS ER 20 MEQ PO TBCR: 20.0000 meq | EXTENDED_RELEASE_TABLET | Freq: Every day | ORAL | Status: DC

## 2016-06-20 DIAGNOSIS — H52202 Unspecified astigmatism, left eye: Secondary | ICD-10-CM | POA: Diagnosis not present

## 2016-06-20 DIAGNOSIS — H43813 Vitreous degeneration, bilateral: Secondary | ICD-10-CM | POA: Diagnosis not present

## 2016-06-20 DIAGNOSIS — H04123 Dry eye syndrome of bilateral lacrimal glands: Secondary | ICD-10-CM | POA: Diagnosis not present

## 2016-06-20 DIAGNOSIS — H524 Presbyopia: Secondary | ICD-10-CM | POA: Diagnosis not present

## 2016-06-27 DIAGNOSIS — Z1231 Encounter for screening mammogram for malignant neoplasm of breast: Secondary | ICD-10-CM | POA: Diagnosis not present

## 2016-06-27 DIAGNOSIS — Z13820 Encounter for screening for osteoporosis: Secondary | ICD-10-CM | POA: Diagnosis not present

## 2016-06-27 DIAGNOSIS — Z01419 Encounter for gynecological examination (general) (routine) without abnormal findings: Secondary | ICD-10-CM | POA: Diagnosis not present

## 2016-06-27 DIAGNOSIS — Z780 Asymptomatic menopausal state: Secondary | ICD-10-CM | POA: Diagnosis not present

## 2016-06-27 DIAGNOSIS — Z6838 Body mass index (BMI) 38.0-38.9, adult: Secondary | ICD-10-CM | POA: Diagnosis not present

## 2016-07-04 ENCOUNTER — Other Ambulatory Visit: Payer: Self-pay

## 2016-07-04 MED ORDER — VITAMIN D (ERGOCALCIFEROL) 1.25 MG (50000 UNIT) PO CAPS: 50000.0000 [IU] | ORAL_CAPSULE | ORAL | Status: DC

## 2016-07-09 DIAGNOSIS — M1712 Unilateral primary osteoarthritis, left knee: Secondary | ICD-10-CM | POA: Diagnosis not present

## 2016-08-10 ENCOUNTER — Other Ambulatory Visit: Payer: Self-pay | Admitting: Sports Medicine

## 2016-08-10 DIAGNOSIS — H8113 Benign paroxysmal vertigo, bilateral: Secondary | ICD-10-CM

## 2016-08-10 MED ORDER — DIAZEPAM 5 MG PO TABS: ORAL_TABLET | ORAL | 0 refills | Status: DC

## 2016-08-13 ENCOUNTER — Other Ambulatory Visit: Payer: Self-pay | Admitting: Sports Medicine

## 2016-08-13 DIAGNOSIS — G4700 Insomnia, unspecified: Secondary | ICD-10-CM

## 2016-08-13 MED ORDER — ZOLPIDEM TARTRATE 10 MG PO TABS: 10.0000 mg | ORAL_TABLET | Freq: Every day | ORAL | 3 refills | Status: DC

## 2016-08-28 DIAGNOSIS — D492 Neoplasm of unspecified behavior of bone, soft tissue, and skin: Secondary | ICD-10-CM | POA: Diagnosis not present

## 2016-08-28 DIAGNOSIS — D225 Melanocytic nevi of trunk: Secondary | ICD-10-CM | POA: Diagnosis not present

## 2016-08-28 DIAGNOSIS — L720 Epidermal cyst: Secondary | ICD-10-CM | POA: Diagnosis not present

## 2016-08-28 DIAGNOSIS — D171 Benign lipomatous neoplasm of skin and subcutaneous tissue of trunk: Secondary | ICD-10-CM | POA: Diagnosis not present

## 2016-08-28 DIAGNOSIS — L579 Skin changes due to chronic exposure to nonionizing radiation, unspecified: Secondary | ICD-10-CM | POA: Diagnosis not present

## 2016-08-28 DIAGNOSIS — L820 Inflamed seborrheic keratosis: Secondary | ICD-10-CM | POA: Diagnosis not present

## 2016-08-28 DIAGNOSIS — L814 Other melanin hyperpigmentation: Secondary | ICD-10-CM | POA: Diagnosis not present

## 2016-09-05 ENCOUNTER — Other Ambulatory Visit: Payer: Self-pay | Admitting: Sports Medicine

## 2016-09-05 ENCOUNTER — Ambulatory Visit (INDEPENDENT_AMBULATORY_CARE_PROVIDER_SITE_OTHER): Payer: Medicare Other | Admitting: Sports Medicine

## 2016-09-05 DIAGNOSIS — M19041 Primary osteoarthritis, right hand: Secondary | ICD-10-CM

## 2016-09-05 DIAGNOSIS — H9319 Tinnitus, unspecified ear: Secondary | ICD-10-CM | POA: Insufficient documentation

## 2016-09-05 DIAGNOSIS — Z96652 Presence of left artificial knee joint: Secondary | ICD-10-CM

## 2016-09-05 DIAGNOSIS — Z23 Encounter for immunization: Secondary | ICD-10-CM | POA: Diagnosis not present

## 2016-09-05 DIAGNOSIS — Z0000 Encounter for general adult medical examination without abnormal findings: Secondary | ICD-10-CM

## 2016-09-05 DIAGNOSIS — T8484XS Pain due to internal orthopedic prosthetic devices, implants and grafts, sequela: Secondary | ICD-10-CM

## 2016-09-05 DIAGNOSIS — H9311 Tinnitus, right ear: Secondary | ICD-10-CM

## 2016-09-05 MED ORDER — DICLOFENAC SODIUM 75 MG PO TBEC: 75.0000 mg | DELAYED_RELEASE_TABLET | Freq: Two times a day (BID) | ORAL | 3 refills | Status: DC

## 2016-09-05 NOTE — Assessment & Plan Note (Signed)
Flu shot today 

## 2016-09-05 NOTE — Assessment & Plan Note (Signed)
Persistent pain despite arthroscopy with takedown of adhesions. Injection by her pain doctor provided some good relief.  Switching from meloxicam to diclofenac.

## 2016-09-05 NOTE — Progress Notes (Signed)
  Subjective:    CC: Couple of issues  HPI: Preventive measures: Desires flu shot.  Right hand pain: Localized at the first MCP, pain is moderate, persistent. Currently taking her meloxicam but has been doing this for several years now. Agreeable to switch to different medication.  Left total knee arthroplasty: Has been back to arthroscopy for takedown of adhesions and scar tissue. Unfortunately had persistent pain, she saw her pain doctor who injected her knee which provided some relief. Wondering if she can have another injection.   Tinnitus: Right-sided, with occasional vertigo. Agreeable to discuss this with ENT.  Past medical history, Surgical history, Family history not pertinant except as noted below, Social history, Allergies, and medications have been entered into the medical record, reviewed, and no changes needed.   Review of Systems: No fevers, chills, night sweats, weight loss, chest pain, or shortness of breath.   Objective:    General: Well Developed, well nourished, and in no acute distress.  Neuro: Alert and oriented x3, extra-ocular muscles intact, sensation grossly intact.  HEENT: Normocephalic, atraumatic, pupils equal round reactive to light, neck supple, no masses, no lymphadenopathy, thyroid nonpalpable.  Skin: Warm and dry, no rashes. Cardiac: Regular rate and rhythm, no murmurs rubs or gallops, no lower extremity edema.  Respiratory: Clear to auscultation bilaterally. Not using accessory muscles, speaking in full sentences. Right hand: Tender to palpation at the first MCP with minimal swelling.  Impression and Recommendations:    Pain due to total left knee replacement Persistent pain despite arthroscopy with takedown of adhesions. Injection by her pain doctor provided some good relief.  Switching from meloxicam to diclofenac.   Primary osteoarthritis, right hand With discrete pain at the right first metacarpal phalangeal joint. Switching to  diclofenac. Injection if no better.  Annual physical exam Flu shot today.  Tinnitus Right-sided with vertigo, referral to ENT.  I spent 25 minutes with this patient, greater than 50% was face-to-face time counseling regarding the above diagnoses

## 2016-09-05 NOTE — Assessment & Plan Note (Signed)
Right-sided with vertigo, referral to ENT.

## 2016-09-05 NOTE — Assessment & Plan Note (Signed)
With discrete pain at the right first metacarpal phalangeal joint. Switching to diclofenac. Injection if no better.

## 2016-09-20 ENCOUNTER — Encounter: Payer: Self-pay | Admitting: Sports Medicine

## 2016-09-20 ENCOUNTER — Ambulatory Visit (INDEPENDENT_AMBULATORY_CARE_PROVIDER_SITE_OTHER): Payer: Medicare Other | Admitting: Sports Medicine

## 2016-09-20 VITALS — BP 112/73 | HR 79 | Resp 18 | Wt 227.5 lb

## 2016-09-20 DIAGNOSIS — Z0000 Encounter for general adult medical examination without abnormal findings: Secondary | ICD-10-CM

## 2016-09-20 DIAGNOSIS — M19041 Primary osteoarthritis, right hand: Secondary | ICD-10-CM

## 2016-09-20 DIAGNOSIS — R7303 Prediabetes: Secondary | ICD-10-CM

## 2016-09-20 DIAGNOSIS — Z1159 Encounter for screening for other viral diseases: Secondary | ICD-10-CM | POA: Diagnosis not present

## 2016-09-20 DIAGNOSIS — I10 Essential (primary) hypertension: Secondary | ICD-10-CM | POA: Diagnosis not present

## 2016-09-20 DIAGNOSIS — E785 Hyperlipidemia, unspecified: Secondary | ICD-10-CM | POA: Diagnosis not present

## 2016-09-20 DIAGNOSIS — Z114 Encounter for screening for human immunodeficiency virus [HIV]: Secondary | ICD-10-CM | POA: Diagnosis not present

## 2016-09-20 LAB — COMPREHENSIVE METABOLIC PANEL
AST: 28 U/L (ref 10–35)
BUN: 27 mg/dL — ABNORMAL HIGH (ref 7–25)
CO2: 29 mmol/L (ref 20–31)
Chloride: 102 mmol/L (ref 98–110)
Creat: 1.25 mg/dL — ABNORMAL HIGH (ref 0.50–0.99)
Sodium: 140 mmol/L (ref 135–146)
Total Protein: 7 g/dL (ref 6.1–8.1)

## 2016-09-20 LAB — LIPID PANEL
Cholesterol: 146 mg/dL (ref 125–200)
HDL: 58 mg/dL (ref >=46)
LDL Cholesterol: 76 mg/dL (ref <130)
Total CHOL/HDL Ratio: 2.5 Ratio (ref <=5.0)
Triglycerides: 61 mg/dL (ref <150)
VLDL: 12 mg/dL (ref <30)

## 2016-09-20 LAB — CBC
HCT: 40.3 % (ref 35.0–45.0)
Hemoglobin: 13.5 g/dL (ref 11.7–15.5)
MCH: 31.6 pg (ref 27.0–33.0)
MCHC: 33.5 g/dL (ref 32.0–36.0)
MCV: 94.4 fL (ref 80.0–100.0)
MPV: 9.7 fL (ref 7.5–12.5)
Platelets: 309 10*3/uL (ref 140–400)
RBC: 4.27 MIL/uL (ref 3.80–5.10)
RDW: 13.6 % (ref 11.0–15.0)
WBC: 8.7 K/uL (ref 3.8–10.8)

## 2016-09-20 LAB — COMPREHENSIVE METABOLIC PANEL WITH GFR
ALT: 22 U/L (ref 6–29)
Albumin: 4.5 g/dL (ref 3.6–5.1)
Alkaline Phosphatase: 52 U/L (ref 33–130)
Calcium: 9.3 mg/dL (ref 8.6–10.4)
Glucose, Bld: 93 mg/dL (ref 65–99)
Potassium: 4.1 mmol/L (ref 3.5–5.3)
Total Bilirubin: 0.9 mg/dL (ref 0.2–1.2)

## 2016-09-20 LAB — TSH: TSH: 1.62 mIU/L

## 2016-09-20 NOTE — Progress Notes (Signed)
Subjective:    Sara [name] is a 65 y.o. female who presents for Medicare Annual/Subsequent preventive examination.  Preventive Screening-Counseling & Management  Tobacco History  Smoking Status  . Never Smoker  Smokeless Tobacco  . Never Used     Problems Prior to Visit 1.   Current Problems (verified) Patient Active Problem List   Diagnosis Date Noted  . Primary osteoarthritis, right hand 09/05/2016  . Tinnitus 09/05/2016  . Insomnia 02/02/2016  . Benign paroxysmal positional vertigo 01/04/2016  . Primary osteoarthritis of both hips 01/04/2016  . Cervical spondylosis 12/09/2013  . Osteoarthritis of right knee 12/09/2013  . TMJ arthralgia 09/24/2013  . Allergic rhinitis 06/03/2013  . Annual physical exam 06/03/2013  . Low back pain radiating to left leg 01/23/2013  . Hypertension 12/22/2012  . Hyperlipidemia with target LDL less than 100 12/22/2012  . Prediabetes 12/22/2012  . Depression with anxiety 07/15/2012  . Asthma with COPD ([place]) 10/14/2011  . GERD (gastroesophageal reflux disease) 02/10/2010  . Pain due to total left knee replacement ([place]) 02/10/2010  . Stress incontinence 02/10/2010    Medications Prior to Visit Current Outpatient Prescriptions on File Prior to Visit  Medication Sig Dispense Refill  . albuterol (PROVENTIL HFA;VENTOLIN HFA) 108 (90 Base) MCG/ACT inhaler Inhale 2 puffs into the lungs every 4 (four) hours as needed for wheezing or shortness of breath. 1 each 6  . albuterol (PROVENTIL) (2.5 MG/3ML) 0.083% nebulizer solution Take 3 mLs (2.5 mg total) by nebulization every 4 (four) hours as needed for wheezing or shortness of breath (please include nebulizer machine, hoses, and mask if needed.). 30 vial 6  . aspirin 81 MG tablet Take 81 mg by mouth daily.      Marland Kitchen atorvastatin (LIPITOR) 20 MG tablet TAKE 1 TABLET DAILY 90 tablet 2  . Calcium Carbonate-Vitamin D 600-400 MG-UNIT tablet Take 1 tablet by mouth 2 (two) times daily. 60 tablet 11  .  COMBIPATCH 0.05-0.14 MG/DAY     . diazepam (VALIUM) 5 MG tablet One tab daily as needed for anxiety or vertigo. 90 tablet 0  . diclofenac (VOLTAREN) 75 MG EC tablet Take 1 tablet (75 mg total) by mouth 2 (two) times daily. 60 tablet 3  . EPINEPHrine 0.3 mg/0.3 mL IJ SOAJ injection Inject 0.3 mLs (0.3 mg total) into the muscle once. 1 Device 11  . gabapentin (NEURONTIN) 800 MG tablet Take 1 tablet (800 mg total) by mouth 3 (three) times daily. 270 tablet 3  . lisinopril-hydrochlorothiazide (PRINZIDE,ZESTORETIC) 20-25 MG tablet TAKE ONE-HALF (1/2) TO ONE TABLET DAILY 90 tablet 2  . metoprolol succinate (TOPROL-XL) 50 MG 24 hr tablet TAKE 1 TABLET DAILY WITH OR IMMEDIATELY FOLLOWING A MEAL 90 tablet 2  . pantoprazole (PROTONIX) 40 MG tablet TAKE 1 TABLET TWICE A DAY BEFORE MEALS 180 tablet 2  . potassium chloride SA (K-DUR,KLOR-CON) 20 MEQ tablet Take 1 tablet (20 mEq total) by mouth daily. OR ONLY WHEN TAKING DEMADEX OR FUROSEMIDE 90 tablet 3  . traMADol (ULTRAM) 50 MG tablet 1-2 tabs by mouth Q8 hours, maximum 6 tabs per day. 90 tablet 3  . traZODone (DESYREL) 150 MG tablet TAKE 1 TABLET AT BEDTIME (NEED APPOINTMENT) 90 tablet 3  . zolpidem (AMBIEN) 10 MG tablet Take 1 tablet (10 mg total) by mouth at bedtime. 90 tablet 3   No current facility-administered medications on file prior to visit.     Current Medications (verified) Current Outpatient Prescriptions  Medication Sig Dispense Refill  . albuterol (PROVENTIL HFA;VENTOLIN  HFA) 108 (90 Base) MCG/ACT inhaler Inhale 2 puffs into the lungs every 4 (four) hours as needed for wheezing or shortness of breath. 1 each 6  . albuterol (PROVENTIL) (2.5 MG/3ML) 0.083% nebulizer solution Take 3 mLs (2.5 mg total) by nebulization every 4 (four) hours as needed for wheezing or shortness of breath (please include nebulizer machine, hoses, and mask if needed.). 30 vial 6  . aspirin 81 MG tablet Take 81 mg by mouth daily.      Marland Kitchen atorvastatin (LIPITOR) 20 MG  tablet TAKE 1 TABLET DAILY 90 tablet 2  . Calcium Carbonate-Vitamin D 600-400 MG-UNIT tablet Take 1 tablet by mouth 2 (two) times daily. 60 tablet 11  . COMBIPATCH 0.05-0.14 MG/DAY     . diazepam (VALIUM) 5 MG tablet One tab daily as needed for anxiety or vertigo. 90 tablet 0  . diclofenac (VOLTAREN) 75 MG EC tablet Take 1 tablet (75 mg total) by mouth 2 (two) times daily. 60 tablet 3  . EPINEPHrine 0.3 mg/0.3 mL IJ SOAJ injection Inject 0.3 mLs (0.3 mg total) into the muscle once. 1 Device 11  . gabapentin (NEURONTIN) 800 MG tablet Take 1 tablet (800 mg total) by mouth 3 (three) times daily. 270 tablet 3  . lisinopril-hydrochlorothiazide (PRINZIDE,ZESTORETIC) 20-25 MG tablet TAKE ONE-HALF (1/2) TO ONE TABLET DAILY 90 tablet 2  . metoprolol succinate (TOPROL-XL) 50 MG 24 hr tablet TAKE 1 TABLET DAILY WITH OR IMMEDIATELY FOLLOWING A MEAL 90 tablet 2  . pantoprazole (PROTONIX) 40 MG tablet TAKE 1 TABLET TWICE A DAY BEFORE MEALS 180 tablet 2  . potassium chloride SA (K-DUR,KLOR-CON) 20 MEQ tablet Take 1 tablet (20 mEq total) by mouth daily. OR ONLY WHEN TAKING DEMADEX OR FUROSEMIDE 90 tablet 3  . traMADol (ULTRAM) 50 MG tablet 1-2 tabs by mouth Q8 hours, maximum 6 tabs per day. 90 tablet 3  . traZODone (DESYREL) 150 MG tablet TAKE 1 TABLET AT BEDTIME (NEED APPOINTMENT) 90 tablet 3  . zolpidem (AMBIEN) 10 MG tablet Take 1 tablet (10 mg total) by mouth at bedtime. 90 tablet 3   No current facility-administered medications for this visit.      Allergies (verified) Bee venom   PAST HISTORY  Family History Family History  Problem Relation Age of Onset  . Diabetes Mother   . Heart attack Mother     First MI at age 55  . Diabetes type II Mother   . Coronary artery disease Mother   . Hypertension Father   . Diabetes Father   . Alcohol abuse Father   . Pancreatitis Father   . Asthma Daughter   . Asthma Grandchild     Social History Social History  Substance Use Topics  . Smoking status:  Never Smoker  . Smokeless tobacco: Never Used  . Alcohol use Yes     Comment: Occasional-Last had a drink a month ago     Are there smokers in your home (other than you)? No  Risk Factors Current exercise habits: Exercises 3 times per week, 60 minutes, goal is to lose 30 pounds by 2018 Dietary issues discussed: Yes   Cardiac risk factors: advanced age (older than 76 for men, 83 for women), dyslipidemia, hypertension, obesity (BMI >= 30 kg/m2) and sedentary lifestyle.  Depression Screen (Note: if answer to either of the following is "Yes", a more complete depression screening is indicated)   Over the past two weeks, have you felt down, depressed or hopeless? No  Over the past two weeks,  have you felt little interest or pleasure in doing things? No  Have you lost interest or pleasure in daily life? No  Do you often feel hopeless? No  Do you cry easily over simple problems? No  Activities of Daily Living In your present state of health, do you have any difficulty performing the following activities?:  Driving? No Managing money?  No Feeding yourself? No Getting from bed to chair? No Climbing a flight of stairs? Yes Preparing food and eating?: No Bathing or showering? No Getting dressed: No Getting to the toilet? No Using the toilet:No Moving around from place to place: No In the past year have you fallen or had a near fall?:No   Are you sexually active?  No  Do you have more than one partner?  No  Hearing Difficulties: Yes Do you often ask people to speak up or repeat themselves? Yes Do you experience ringing or noises in your ears? Yes Do you have difficulty understanding soft or whispered voices? Yes   Do you feel that you have a problem with memory? No  Do you often misplace items? No  Do you feel safe at home?  Yes  Cognitive Testing  Alert? Yes  Normal Appearance?Yes  Oriented to person? Yes  Place? Yes   Time? Yes  Recall of three objects?  Yes  Can perform  simple calculations? Yes  Displays appropriate judgment?Yes  Can read the correct time from a watch face?Yes   Advanced Directives have been discussed with the patient? No  List the Names of Other Physician/Practitioners you currently use: 1.    Indicate any recent Medical Services you may have received from other than [facility] providers in the past year (date may be approximate).  Immunization History  Administered Date(s) Administered  . Influenza Split 09/27/2011, 09/23/2012  . Influenza Whole 10/11/2010  . Influenza,inj,Quad PF,36+ Mos 09/24/2013, 10/20/2014, 09/21/2015, 09/05/2016  . Pneumococcal Polysaccharide-23 03/04/2012  . Tdap 03/04/2012  . Zoster 08/31/2015    Screening Tests Health Maintenance  Topic Date Due  . Hepatitis C Screening  December 01, 1951  . HIV Screening  06/16/1968  . PNEUMOCOCCAL POLYSACCHARIDE VACCINE (2) 03/04/2017  . MAMMOGRAM  05/22/2017  . PAP SMEAR  05/22/2018  . TETANUS/TDAP  03/04/2022  . COLONOSCOPY  01/02/2023  . INFLUENZA VACCINE  Completed  . ZOSTAVAX  Completed    All answers were reviewed with the patient and necessary referrals were made:  Aundria Mems, MD   09/20/2016   History reviewed: allergies, current medications, past family history, past medical history, past social history, past surgical history and problem list  Review of Systems A comprehensive review of systems was negative.    Objective:   Vision by Snellen: See flow sheet.  Body mass index is 37.86 kg/m. BP 112/73   Pulse 79   Resp 18   Wt 227 lb 8 oz (103.2 kg)   BMI 37.86 kg/m  General: Well Developed, well nourished, and in no acute distress.  Neuro: Alert and oriented x3, extra-ocular muscles intact, sensation grossly intact. Cranial nerves II through XII are intact, motor, sensory, and coordinative functions are all intact. HEENT: Normocephalic, atraumatic, pupils equal round reactive to light, neck supple, no masses, no lymphadenopathy, thyroid  nonpalpable. Oropharynx, nasopharynx, external ear canals are unremarkable. Skin: Warm and dry, no rashes noted.  Cardiac: Regular rate and rhythm, no murmurs rubs or gallops.  Respiratory: Clear to auscultation bilaterally. Not using accessory muscles, speaking in full sentences.  Abdominal: Soft, nontender, nondistended, positive  bowel sounds, no masses, no organomegaly.  Musculoskeletal: Shoulder, elbow, wrist, hip, knee, ankle stable, and with full range of motion.    Assessment:     Healthy female     Plan:     During the course of the visit the patient was educated and counseled about appropriate screening and preventive services including:    Routine blood work  Diet review for nutrition referral? Yes ____  Not Indicated _x___   Patient Instructions (the written plan) was given to the patient.  Medicare Attestation I have personally reviewed: The patient's medical and social history Their use of alcohol, tobacco or illicit drugs Their current medications and supplements The patient's functional ability including ADLs,fall risks, home safety risks, cognitive, and hearing and visual impairment Diet and physical activities Evidence for depression or mood disorders  The patient's weight, height, BMI, and visual acuity have been recorded in the chart.  I have made referrals, counseling, and provided education to the patient based on review of the above and I have provided the patient with a written personalized care plan for preventive services.     Aundria Mems, MD   09/20/2016

## 2016-09-20 NOTE — Assessment & Plan Note (Signed)
Medicare physical as above, checking routine blood work.

## 2016-09-20 NOTE — Assessment & Plan Note (Signed)
Resolved with conservative measures. 

## 2016-09-20 NOTE — Assessment & Plan Note (Signed)
Well controlled, no changes 

## 2016-09-20 NOTE — Assessment & Plan Note (Signed)
Has been well-controlled, rechecking A1c

## 2016-09-21 LAB — HIV ANTIBODY (ROUTINE TESTING W REFLEX): HIV 1&2 Ab, 4th Generation: NONREACTIVE

## 2016-09-21 LAB — HEMOGLOBIN A1C
Hgb A1c MFr Bld: 5.5 % (ref <5.7)
Mean Plasma Glucose: 111 mg/dL

## 2016-09-21 LAB — HEPATITIS C ANTIBODY: HCV Ab: NEGATIVE

## 2016-10-01 DIAGNOSIS — H93293 Other abnormal auditory perceptions, bilateral: Secondary | ICD-10-CM | POA: Diagnosis not present

## 2016-10-01 DIAGNOSIS — H9311 Tinnitus, right ear: Secondary | ICD-10-CM | POA: Diagnosis not present

## 2016-10-09 DIAGNOSIS — H903 Sensorineural hearing loss, bilateral: Secondary | ICD-10-CM | POA: Diagnosis not present

## 2016-10-12 DIAGNOSIS — H9311 Tinnitus, right ear: Secondary | ICD-10-CM | POA: Diagnosis not present

## 2016-10-12 DIAGNOSIS — H905 Unspecified sensorineural hearing loss: Secondary | ICD-10-CM | POA: Diagnosis not present

## 2016-11-05 ENCOUNTER — Other Ambulatory Visit: Payer: Self-pay

## 2016-11-05 MED ORDER — PANTOPRAZOLE SODIUM 40 MG PO TBEC: DELAYED_RELEASE_TABLET | ORAL | 2 refills | Status: DC

## 2016-11-27 DIAGNOSIS — L218 Other seborrheic dermatitis: Secondary | ICD-10-CM | POA: Diagnosis not present

## 2017-01-03 ENCOUNTER — Other Ambulatory Visit: Payer: Self-pay | Admitting: Sports Medicine

## 2017-01-03 DIAGNOSIS — T8484XS Pain due to internal orthopedic prosthetic devices, implants and grafts, sequela: Secondary | ICD-10-CM

## 2017-01-03 DIAGNOSIS — Z96652 Presence of left artificial knee joint: Secondary | ICD-10-CM

## 2017-01-15 ENCOUNTER — Other Ambulatory Visit: Payer: Self-pay

## 2017-01-15 DIAGNOSIS — Z96652 Presence of left artificial knee joint: Secondary | ICD-10-CM

## 2017-01-15 DIAGNOSIS — T8484XS Pain due to internal orthopedic prosthetic devices, implants and grafts, sequela: Secondary | ICD-10-CM

## 2017-01-15 DIAGNOSIS — G4700 Insomnia, unspecified: Secondary | ICD-10-CM

## 2017-01-15 MED ORDER — ZOLPIDEM TARTRATE 10 MG PO TABS: 10.0000 mg | ORAL_TABLET | Freq: Every day | ORAL | 3 refills | Status: DC

## 2017-01-15 MED ORDER — DICLOFENAC SODIUM 75 MG PO TBEC: 75.0000 mg | DELAYED_RELEASE_TABLET | Freq: Two times a day (BID) | ORAL | 3 refills | Status: DC

## 2017-01-15 MED ORDER — ATORVASTATIN CALCIUM 20 MG PO TABS: 20.0000 mg | ORAL_TABLET | Freq: Every day | ORAL | 2 refills | Status: DC

## 2017-01-15 MED ORDER — GABAPENTIN 800 MG PO TABS: 800.0000 mg | ORAL_TABLET | Freq: Three times a day (TID) | ORAL | 3 refills | Status: DC

## 2017-01-15 MED ORDER — POTASSIUM CHLORIDE CRYS ER 20 MEQ PO TBCR: 20.0000 meq | EXTENDED_RELEASE_TABLET | Freq: Every day | ORAL | 3 refills | Status: DC

## 2017-01-15 MED ORDER — METOPROLOL SUCCINATE ER 50 MG PO TB24: ORAL_TABLET | ORAL | 2 refills | Status: DC

## 2017-01-15 MED ORDER — PANTOPRAZOLE SODIUM 40 MG PO TBEC: DELAYED_RELEASE_TABLET | ORAL | 2 refills | Status: DC

## 2017-01-18 ENCOUNTER — Other Ambulatory Visit: Payer: Self-pay

## 2017-01-18 MED ORDER — TRAZODONE HCL 150 MG PO TABS: ORAL_TABLET | ORAL | 3 refills | Status: DC

## 2017-02-05 ENCOUNTER — Ambulatory Visit (INDEPENDENT_AMBULATORY_CARE_PROVIDER_SITE_OTHER): Payer: Medicare Other | Admitting: Sports Medicine

## 2017-02-05 ENCOUNTER — Encounter: Payer: Self-pay | Admitting: Sports Medicine

## 2017-02-05 DIAGNOSIS — Z96652 Presence of left artificial knee joint: Secondary | ICD-10-CM | POA: Diagnosis not present

## 2017-02-05 DIAGNOSIS — M25562 Pain in left knee: Secondary | ICD-10-CM | POA: Diagnosis not present

## 2017-02-05 DIAGNOSIS — F418 Other specified anxiety disorders: Secondary | ICD-10-CM | POA: Diagnosis not present

## 2017-02-05 DIAGNOSIS — I10 Essential (primary) hypertension: Secondary | ICD-10-CM | POA: Diagnosis not present

## 2017-02-05 LAB — CBC
HCT: 39.4 % (ref 35.0–45.0)
Hemoglobin: 13.1 g/dL (ref 11.7–15.5)
MCH: 31.6 pg (ref 27.0–33.0)
MCHC: 33.2 g/dL (ref 32.0–36.0)
MCV: 94.9 fL (ref 80.0–100.0)
MPV: 9.4 fL (ref 7.5–12.5)
Platelets: 299 K/uL (ref 140–400)
RBC: 4.15 MIL/uL (ref 3.80–5.10)
RDW: 13.6 % (ref 11.0–15.0)
WBC: 7.8 10*3/uL (ref 3.8–10.8)

## 2017-02-05 LAB — TSH: TSH: 1.71 m[IU]/L

## 2017-02-05 LAB — BASIC METABOLIC PANEL
BUN: 20 mg/dL (ref 7–25)
CO2: 26 mmol/L (ref 20–31)
Chloride: 107 mmol/L (ref 98–110)
Glucose, Bld: 89 mg/dL (ref 65–99)
Potassium: 3.9 mmol/L (ref 3.5–5.3)
Sodium: 141 mmol/L (ref 135–146)

## 2017-02-05 LAB — FOLATE: Folate: 20.7 ng/mL (ref >5.4)

## 2017-02-05 LAB — VITAMIN B12: Vitamin B-12: 638 pg/mL (ref 200–1100)

## 2017-02-05 LAB — BASIC METABOLIC PANEL WITH GFR
Calcium: 9.2 mg/dL (ref 8.6–10.4)
Creat: 0.93 mg/dL (ref 0.50–0.99)

## 2017-02-05 NOTE — Assessment & Plan Note (Signed)
Doing well. She has been off torsemide still taking potassium, rechecking electrolytes.

## 2017-02-05 NOTE — Progress Notes (Signed)
  Subjective:    CC: Follow-up  HPI: Memory loss: Known anxiety and depression, has stopped medication. Mostly has some absentmindedness.  Left knee pain: Is post revision arthroplasty. Overall doing well, gets occasional catching and clicking that is painful, she has had a arthroscopy for lysis of scar tissue, orthotics surgery is considering another one. She did have an injection with her pain management provider that provided good relief sometime ago and she desires a repeat.  Past medical history:  Negative.  See flowsheet/record as well for more information.  Surgical history: Negative.  See flowsheet/record as well for more information.  Family history: Negative.  See flowsheet/record as well for more information.  Social history: Negative.  See flowsheet/record as well for more information.  Allergies, and medications have been entered into the medical record, reviewed, and no changes needed.   Review of Systems: No fevers, chills, night sweats, weight loss, chest pain, or shortness of breath.   Objective:    General: Well Developed, well nourished, and in no acute distress.  Neuro: Alert and oriented x3, extra-ocular muscles intact, sensation grossly intact.  HEENT: Normocephalic, atraumatic, pupils equal round reactive to light, neck supple, no masses, no lymphadenopathy, thyroid nonpalpable.  Skin: Warm and dry, no rashes. Cardiac: Regular rate and rhythm, no murmurs rubs or gallops, no lower extremity edema.  Respiratory: Clear to auscultation bilaterally. Not using accessory muscles, speaking in full sentences. Left Knee: Well-healed arthroplasty scar, good range of motion, she does get an occasional catch in a click that is painful to her. Ligaments with solid consistent endpoints including ACL, PCL, LCL, MCL. Negative Mcmurray's and provocative meniscal tests. Non painful patellar compression. Patellar and quadriceps tendons unremarkable. Hamstring and quadriceps strength  is normal.  Procedure: Real-time Ultrasound Guided Injection of left knee Device: GE Logiq E  Verbal informed consent obtained.  Time-out conducted.  Noted no overlying erythema, induration, or other signs of local infection.  Skin prepped in a sterile fashion.  Local anesthesia: Topical Ethyl chloride.  With sterile technique and under real time ultrasound guidance:  1 mL kenalog 40, 2 mL lidocaine, 2 mL Marcaine injected easily. Completed without difficulty  Pain immediately resolved suggesting accurate placement of the medication.  Advised to call if fevers/chills, erythema, induration, drainage, or persistent bleeding.  Images permanently stored and available for review in the ultrasound unit.  Impression: Technically successful ultrasound guided injection.  Impression and Recommendations:    Left knee revision arthroplasty Continues to do better after arthroscopic lysis of effusions and injection, repeat injection performed today. She does have a leg length discrepancy, left longer than right, return for custom orthotics with a lift on the right side.  Hypertension Doing well. She has been off torsemide still taking potassium, rechecking electrolytes.  Depression with anxiety Having some forgetfulness and absentmindedness. I don't think this is dementia, we are going to do the workup. If ultimately negative at the follow-up visit we will proceed with aggressive treatment of anxiety and depression.

## 2017-02-05 NOTE — Assessment & Plan Note (Addendum)
Having some forgetfulness and absentmindedness. I don't think this is dementia, we are going to do the workup. If ultimately negative at the follow-up visit we will proceed with aggressive treatment of anxiety and depression.

## 2017-02-05 NOTE — Assessment & Plan Note (Signed)
Continues to do better after arthroscopic lysis of effusions and injection, repeat injection performed today. She does have a leg length discrepancy, left longer than right, return for custom orthotics with a lift on the right side.

## 2017-02-06 LAB — SEDIMENTATION RATE: Sed Rate: 6 mm/h (ref 0–30)

## 2017-02-06 LAB — RPR

## 2017-02-08 ENCOUNTER — Other Ambulatory Visit: Payer: Self-pay

## 2017-02-08 DIAGNOSIS — T8484XS Pain due to internal orthopedic prosthetic devices, implants and grafts, sequela: Secondary | ICD-10-CM

## 2017-02-08 DIAGNOSIS — Z96652 Presence of left artificial knee joint: Secondary | ICD-10-CM

## 2017-02-08 MED ORDER — DICLOFENAC SODIUM 75 MG PO TBEC: 75.0000 mg | DELAYED_RELEASE_TABLET | Freq: Two times a day (BID) | ORAL | 3 refills | Status: DC

## 2017-02-12 ENCOUNTER — Encounter: Payer: Self-pay | Admitting: Sports Medicine

## 2017-02-12 ENCOUNTER — Ambulatory Visit (INDEPENDENT_AMBULATORY_CARE_PROVIDER_SITE_OTHER): Payer: Medicare Other | Admitting: Sports Medicine

## 2017-02-12 DIAGNOSIS — F418 Other specified anxiety disorders: Secondary | ICD-10-CM | POA: Diagnosis not present

## 2017-02-12 DIAGNOSIS — Z96652 Presence of left artificial knee joint: Secondary | ICD-10-CM

## 2017-02-12 MED ORDER — SERTRALINE HCL 25 MG PO TABS: 25.0000 mg | ORAL_TABLET | Freq: Every day | ORAL | 2 refills | Status: DC

## 2017-02-12 NOTE — Assessment & Plan Note (Signed)
I'm going to add initially low-dose Zoloft, return in one month for a PHQ9 and GAD7. Dementia workup was negative suggesting forgetfulness is likely mild cognitive impairment from depression.

## 2017-02-12 NOTE — Addendum Note (Signed)
Addended by: Silverio Decamp on: 02/12/2017 09:54 AM   Modules accepted: Orders

## 2017-02-12 NOTE — Progress Notes (Signed)
    Patient was fitted for a : standard, cushioned, semi-rigid orthotic. The orthotic was heated and afterward the patient stood on the orthotic blank positioned on the orthotic stand. The patient was positioned in subtalar neutral position and 10 degrees of ankle dorsiflexion in a weight bearing stance. After completion of molding, a stable base was applied to the orthotic blank. The blank was ground to a stable position for weight bearing. Size: 9 Base: [employer] safety [employer] and Padding: Lift placed on the right orthotic The patient ambulated these, and they were very comfortable.  I spent 40 minutes with this patient, greater than 50% was face-to-face time counseling regarding the below diagnosis.

## 2017-02-12 NOTE — Assessment & Plan Note (Signed)
Custom orthotics as above with lift on the right side. She did better after an arthroscopic lysis of adhesions, and an injection performed at the last visit. Return in one month.

## 2017-02-25 ENCOUNTER — Encounter: Payer: Self-pay | Admitting: Sports Medicine

## 2017-03-12 ENCOUNTER — Ambulatory Visit: Payer: Self-pay | Admitting: Sports Medicine

## 2017-03-18 ENCOUNTER — Other Ambulatory Visit: Payer: Self-pay | Admitting: Sports Medicine

## 2017-03-18 ENCOUNTER — Encounter: Payer: Self-pay | Admitting: Sports Medicine

## 2017-03-18 DIAGNOSIS — E785 Hyperlipidemia, unspecified: Secondary | ICD-10-CM

## 2017-03-26 ENCOUNTER — Ambulatory Visit (INDEPENDENT_AMBULATORY_CARE_PROVIDER_SITE_OTHER): Payer: Medicare Other | Admitting: Sports Medicine

## 2017-03-26 VITALS — BP 126/82 | HR 81 | Wt 211.0 lb

## 2017-03-26 DIAGNOSIS — Z0000 Encounter for general adult medical examination without abnormal findings: Secondary | ICD-10-CM | POA: Diagnosis not present

## 2017-03-26 DIAGNOSIS — Z23 Encounter for immunization: Secondary | ICD-10-CM | POA: Diagnosis not present

## 2017-03-26 DIAGNOSIS — F418 Other specified anxiety disorders: Secondary | ICD-10-CM

## 2017-03-26 NOTE — Addendum Note (Signed)
Addended by: Elizabeth Sauer on: 03/26/2017 01:32 PM   Modules accepted: Orders

## 2017-03-26 NOTE — Assessment & Plan Note (Signed)
Pneumococcal 13.

## 2017-03-26 NOTE — Progress Notes (Signed)
  Subjective:    CC: Follow-up of couple of issues  HPI: This is a pleasant 66 year old female, she had an injection in some therapy, doing well. She also self discontinued her Zoloft, denies any further symptoms of anxiety or depression. She is happy, due for pneumonia vaccine but otherwise has no complaints and no concerns  Past medical history:  Negative.  See flowsheet/record as well for more information.  Surgical history: Negative.  See flowsheet/record as well for more information.  Family history: Negative.  See flowsheet/record as well for more information.  Social history: Negative.  See flowsheet/record as well for more information.  Allergies, and medications have been entered into the medical record, reviewed, and no changes needed.   Review of Systems: No fevers, chills, night sweats, weight loss, chest pain, or shortness of breath.   Objective:    General: Well Developed, well nourished, and in no acute distress.  Neuro: Alert and oriented x3, extra-ocular muscles intact, sensation grossly intact.  HEENT: Normocephalic, atraumatic, pupils equal round reactive to light, neck supple, no masses, no lymphadenopathy, thyroid nonpalpable.  Skin: Warm and dry, no rashes. Cardiac: Regular rate and rhythm, no murmurs rubs or gallops, no lower extremity edema.  Respiratory: Clear to auscultation bilaterally. Not using accessory muscles, speaking in full sentences.  Impression and Recommendations:    Depression with anxiety Self discontinued Zoloft, overall feels well. Follow-up as needed for this.  Annual physical exam Pneumococcal 13.

## 2017-03-26 NOTE — Assessment & Plan Note (Signed)
Self discontinued Zoloft, overall feels well. Follow-up as needed for this.

## 2017-04-09 ENCOUNTER — Other Ambulatory Visit: Payer: Self-pay | Admitting: Sports Medicine

## 2017-04-09 DIAGNOSIS — J4521 Mild intermittent asthma with (acute) exacerbation: Secondary | ICD-10-CM

## 2017-05-07 ENCOUNTER — Encounter: Payer: Self-pay | Admitting: Sports Medicine

## 2017-05-11 ENCOUNTER — Other Ambulatory Visit: Payer: Self-pay | Admitting: Sports Medicine

## 2017-05-11 DIAGNOSIS — F418 Other specified anxiety disorders: Secondary | ICD-10-CM

## 2017-05-17 ENCOUNTER — Encounter: Payer: Self-pay | Admitting: Sports Medicine

## 2017-06-17 ENCOUNTER — Encounter: Payer: Self-pay | Admitting: Sports Medicine

## 2017-06-17 ENCOUNTER — Other Ambulatory Visit: Payer: Self-pay | Admitting: Sports Medicine

## 2017-07-10 ENCOUNTER — Ambulatory Visit (INDEPENDENT_AMBULATORY_CARE_PROVIDER_SITE_OTHER): Payer: Medicare Other | Admitting: Sports Medicine

## 2017-07-10 ENCOUNTER — Ambulatory Visit (INDEPENDENT_AMBULATORY_CARE_PROVIDER_SITE_OTHER): Payer: Medicare Other

## 2017-07-10 ENCOUNTER — Encounter: Payer: Self-pay | Admitting: Sports Medicine

## 2017-07-10 DIAGNOSIS — M1711 Unilateral primary osteoarthritis, right knee: Secondary | ICD-10-CM | POA: Diagnosis not present

## 2017-07-10 DIAGNOSIS — G4700 Insomnia, unspecified: Secondary | ICD-10-CM

## 2017-07-10 DIAGNOSIS — M1611 Unilateral primary osteoarthritis, right hip: Secondary | ICD-10-CM | POA: Diagnosis not present

## 2017-07-10 DIAGNOSIS — M26623 Arthralgia of bilateral temporomandibular joint: Secondary | ICD-10-CM

## 2017-07-10 MED ORDER — ZOLPIDEM TARTRATE 10 MG PO TABS: 10.0000 mg | ORAL_TABLET | Freq: Every day | ORAL | 3 refills | Status: DC

## 2017-07-10 MED ORDER — CALCIUM CARBONATE-VITAMIN D 600-400 MG-UNIT PO TABS: 1.0000 | ORAL_TABLET | Freq: Two times a day (BID) | ORAL | 11 refills | Status: DC

## 2017-07-10 MED ORDER — TRAMADOL HCL 50 MG PO TABS: ORAL_TABLET | ORAL | 3 refills | Status: DC

## 2017-07-10 MED ORDER — TRAZODONE HCL 150 MG PO TABS: ORAL_TABLET | ORAL | 3 refills | Status: DC

## 2017-07-10 NOTE — Assessment & Plan Note (Signed)
Has failed conservative measures, adding some more formal physical therapy, we did do a right hip injection today, I also need updated x-rays.

## 2017-07-10 NOTE — Progress Notes (Signed)
Subjective:    CC: Hip and knee pain  HPI: This is a pleasant 66 year old female, she does have morbid obesity, she is post left knee arthroplasty, having pain in her right knee, and right groin. She has been doing her exercise program, pain is moderate, persistent, localized at the medial joint line of the knee and directly in the groin with gelling, no mechanical symptoms, note from a. She has been taking tramadol and Voltaren without any improvement, and desires interventional treatment today.  Past medical history:  Negative.  See flowsheet/record as well for more information.  Surgical history: Negative.  See flowsheet/record as well for more information.  Family history: Negative.  See flowsheet/record as well for more information.  Social history: Negative.  See flowsheet/record as well for more information.  Allergies, and medications have been entered into the medical record, reviewed, and no changes needed.   Review of Systems: No fevers, chills, night sweats, weight loss, chest pain, or shortness of breath.   Objective:    General: Well Developed, well nourished, and in no acute distress.  Neuro: Alert and oriented x3, extra-ocular muscles intact, sensation grossly intact.  HEENT: Normocephalic, atraumatic, pupils equal round reactive to light, neck supple, no masses, no lymphadenopathy, thyroid nonpalpable.  Skin: Warm and dry, no rashes. Cardiac: Regular rate and rhythm, no murmurs rubs or gallops, no lower extremity edema.  Respiratory: Clear to auscultation bilaterally. Not using accessory muscles, speaking in full sentences. Right Knee: Normal to inspection with no erythema or effusion or obvious bony abnormalities. Tender to palpation at the medial joint line ROM normal in flexion and extension and lower leg rotation. Ligaments with solid consistent endpoints including ACL, PCL, LCL, MCL. Negative Mcmurray's and provocative meniscal tests. Non painful patellar  compression. Patellar and quadriceps tendons unremarkable. Hamstring and quadriceps strength is normal. Right Hip: ROM IR: 10 with reproduction of groin pain, ER: 60 Deg, Flexion: 120 Deg, Extension: 100 Deg, Abduction: 45 Deg, Adduction: 45 Deg Strength IR: 5/5, ER: 5/5, Flexion: 5/5, Extension: 5/5, Abduction: 5/5, Adduction: 5/5 Pelvic alignment unremarkable to inspection and palpation. Standing hip rotation and gait without trendelenburg / unsteadiness. Greater trochanter without tenderness to palpation. No tenderness over piriformis. No SI joint tenderness and normal minimal SI movement.  Procedure: Real-time Ultrasound Guided Injection of right knee Device: GE Logiq E  Verbal informed consent obtained.  Time-out conducted.  Noted no overlying erythema, induration, or other signs of local infection.  Skin prepped in a sterile fashion.  Local anesthesia: Topical Ethyl chloride.  With sterile technique and under real time ultrasound guidance:  1 mL kenalog 40, 2 mL lidocaine, 2 mL bupivacaine injected easily Completed without difficulty  Pain immediately resolved suggesting accurate placement of the medication.  Advised to call if fevers/chills, erythema, induration, drainage, or persistent bleeding.  Images permanently stored and available for review in the ultrasound unit.  Impression: Technically successful ultrasound guided injection.  Procedure: Real-time Ultrasound Guided Injection of right hip joint Device: GE Logiq E  Verbal informed consent obtained.  Time-out conducted.  Noted no overlying erythema, induration, or other signs of local infection.  Skin prepped in a sterile fashion.  Local anesthesia: Topical Ethyl chloride.  With sterile technique and under real time ultrasound guidance:  1 mL kenalog 40, 2 mL lidocaine, 2 mL bupivacaine injected easily Completed without difficulty  Pain immediately resolved suggesting accurate placement of the medication.  Advised  to call if fevers/chills, erythema, induration, drainage, or persistent bleeding.  Images permanently stored  and available for review in the ultrasound unit.  Impression: Technically successful ultrasound guided injection.  Impression and Recommendations:    Primary osteoarthritis of right knee Status post left total knee arthroplasty, mild degenerative changes in the right knee. Injection as above, she has failed NSAIDs, tramadol. I will set her up for formal physical therapy for this as well. Continues to need to work aggressively on weight loss.  Primary osteoarthritis of right hip Has failed conservative measures, adding some more formal physical therapy, we did do a right hip injection today, I also need updated x-rays.

## 2017-07-10 NOTE — Assessment & Plan Note (Signed)
Status post left total knee arthroplasty, mild degenerative changes in the right knee. Injection as above, she has failed NSAIDs, tramadol. I will set her up for formal physical therapy for this as well. Continues to need to work aggressively on weight loss.

## 2017-07-22 ENCOUNTER — Ambulatory Visit (INDEPENDENT_AMBULATORY_CARE_PROVIDER_SITE_OTHER): Payer: Medicare Other | Admitting: Rehabilitative and Restorative Service Providers"

## 2017-07-22 DIAGNOSIS — M25561 Pain in right knee: Secondary | ICD-10-CM

## 2017-07-22 DIAGNOSIS — M25562 Pain in left knee: Secondary | ICD-10-CM

## 2017-07-22 DIAGNOSIS — R29898 Other symptoms and signs involving the musculoskeletal system: Secondary | ICD-10-CM

## 2017-07-22 NOTE — Therapy (Signed)
[place], [place], [address] Phone: [phone]   Fax:  [phone]  Physical Therapy Evaluation  Patient Details  Name: Sara [name] MRN: [id number] Date of Birth: 1951-05-26 Referring Provider: Dr [name]   Encounter Date: 07/22/2017      PT End of Session - 07/22/17 1406    Visit Number 1   Number of Visits 6   Date for PT Re-Evaluation 09/03/17   PT Start Time 3570   PT Stop Time 1503   PT Time Calculation (min) 56 min   Activity Tolerance Patient tolerated treatment well      Past Medical History:  Diagnosis Date  . Asthma   . Fibromyalgia    [name]  . Hyperlipidemia   . Hypertension   . Postmenopausal bleeding     Past Surgical History:  Procedure Laterality Date  . BREAST BIOPSY  1980   neg  . TOTAL KNEE ARTHROPLASTY  2000   Right, Dr. Tonita Cong    There were no vitals filed for this visit.       Subjective Assessment - 07/22/17 1413    Subjective Sara [name] reports arthritis in both knees for many years. She has had a flare up of Rt knee pain in the past 6 weeks. She notes some improvement since injection 07/10/17. She continues to have some discomfort but symtpoms are improved. She has modified her exercises and wants to avoid knee surgery.    Pertinent History 2 Lt TKA; 3-4 scopes Lt knee last surgery 1/16   How long can you sit comfortably? no limit    How long can you stand comfortably? 15 min    How long can you walk comfortably? 30 min    Diagnostic tests xrays    Patient Stated Goals pain reduction and increase flexibility and avoid surgery    Currently in Pain? No/denies   Pain Location Knee   Pain Orientation Right   Pain Type Chronic pain   Pain Radiating Towards into the posterior thigh and buttocks    Pain Onset More than a month ago   Pain Frequency Intermittent   Aggravating Factors  jogging; prolonged standing             [facility] PT  Assessment - 07/22/17 0001      Assessment   Medical Diagnosis Osteoarthritis Rt knee    Referring Provider Dr [name]    Onset Date/Surgical Date 05/31/17   Hand Dominance Right   Next MD Visit 08/07/17   Prior Therapy yes for Lt knee      Precautions   Precautions None     Balance Screen   Has the patient fallen in the past 6 months No   Has the patient had a decrease in activity level because of a fear of falling?  No   Is the patient reluctant to leave their home because of a fear of falling?  No     Prior Function   Level of Independence Independent   Vocation Retired   Museum/gallery curator retired ~5 yrs ago    Leisure gym 3 days/wk; cares for mother in law some 67 yr old; reading; stretching at home; household chores      Observation/Other Assessments   Focus on Therapeutic Outcomes (FOTO)  39% limitation      Sensation   Additional Comments WFL's per pt report      Posture/Postural Control   Posture Comments head forward shoudlers rounded  AROM   Right Knee Extension 0   Right Knee Flexion 130   Left Knee Extension 0   Left Knee Flexion 112   Right/Left Ankle --  WNL's bilat      Strength   Right Hip Flexion 5/5   Right Hip Extension --  5-/5   Right Hip ABduction 5/5   Left Hip Flexion 5/5   Left Hip Extension --  5-/5   Left Hip ABduction 5/5   Right Knee Flexion 5/5   Right Knee Extension 5/5   Left Knee Flexion 5/5   Left Knee Extension 5/5     Flexibility   Hamstrings 110 deg bilat   Quadriceps Rt 112 deg; Lt 93   ITB WFL's bilat   Piriformis WFL's bilat             Objective measurements completed on examination: See above findings.          [facility] Adult PT Treatment/Exercise - 07/22/17 0001      Knee/Hip Exercises: Stretches   Hip Flexor Stretch 2 reps;30 seconds  sitting on LE off edge of table      Knee/Hip Exercises: Standing   Heel Raises Both;15 reps   Hip Abduction Stengthening;Right;Left;1 set;10  reps  green TB    Hip Extension Stengthening;Right;Left;1 set;10 reps  greenTB      Knee/Hip Exercises: Supine   Bridges Strengthening;Both;10 reps   Bridges with [employer] Strengthening;Both;10 reps   Bridges with Clamshell Strengthening;Both;10 reps   Other Supine Knee/Hip Exercises Clams one LE still opposite moving into abduction x 10 reps each      Knee/Hip Exercises: Sidelying   Clams 10 reps green TB at thighs; bilat      Knee/Hip Exercises: Prone   Hip Extension Strengthening;Right;Left;10 reps   Other Prone Exercises glut set 10 sec hold x 10 reps                 PT Education - 07/22/17 1443    Education provided Yes   Education Details HEP    Person(s) Educated Patient   Methods Explanation;Demonstration;Tactile cues;Verbal cues;Handout   Comprehension Verbalized understanding;Returned demonstration;Verbal cues required;Tactile cues required             PT Long Term Goals - 07/22/17 1408      PT LONG TERM GOAL #1   Title Initiate initial HEP with advice on gradually increasing HEP and gym program 07/22/17   Time 1   Period Days   Status New     PT LONG TERM GOAL #2   Title Progress HEP program to inculde higher level strengthening and balance activities 09/03/17   Time 6   Period Weeks   Status New     PT LONG TERM GOAL #3   Title Return to prior exercise level without symptoms 09/03/17   Time 6   Period Weeks   Status New     PT LONG TERM GOAL #4   Title Independent in HEP 09/03/17   Time 6   Period Weeks   Status New     PT LONG TERM GOAL #5   Title Improve FOTO to </= 35% limitation 09/03/17   Time 6   Period Weeks   Status New                Plan - 07/22/17 1455    Clinical Impression Statement Sara [name] presents with flare up of Rt knee pain over the past 6 weeks. Symptoms have improved following injections  in Rt hip and knee 07/10/17. Patient is currently in an exercise program 3 days per week and will radually return to her  program limiting resistance and reps as she returns to previous level of exercise. She will benefit form PT as she ramps up her exercise program to guide progression and add additional higher level LE strengthening exercises to avoid future flare ups and return to normal functional activity level.    Clinical Presentation Stable   Clinical Decision Making Low   Rehab Potential Good   PT Frequency 1x / week   PT Duration 6 weeks   PT Treatment/Interventions Patient/family education;ADLs/Self Care Home Management;Cryotherapy;Electrical Stimulation;Iontophoresis 4mg /ml Dexamethasone;Moist Heat;Ultrasound;Dry needling;Manual techniques;Therapeutic activities;Therapeutic exercise;Neuromuscular re-education   PT Next Visit Plan review HEP; progress with higher level strengthening and balance activities to strengthen LE's and increase activity - returning patient to previous activity level without exacerbation of symptoms.    Consulted and Agree with Plan of Care Patient      Patient will benefit from skilled therapeutic intervention in order to improve the following deficits and impairments:  Decreased range of motion, Decreased mobility, Decreased strength, Pain, Decreased activity tolerance  Visit Diagnosis: Acute pain of left knee - Plan: PT plan of care cert/re-cert  Other symptoms and signs involving the musculoskeletal system - Plan: PT plan of care cert/re-cert     Problem List Patient Active Problem List   Diagnosis Date Noted  . Primary osteoarthritis of right hip 07/10/2017  . Primary osteoarthritis, right hand 09/05/2016  . Tinnitus 09/05/2016  . Insomnia 02/02/2016  . Benign paroxysmal positional vertigo 01/04/2016  . Primary osteoarthritis of both hips 01/04/2016  . Cervical spondylosis 12/09/2013  . Primary osteoarthritis of right knee 12/09/2013  . TMJ arthralgia 09/24/2013  . Allergic rhinitis 06/03/2013  . Annual physical exam 06/03/2013  . Low back pain radiating to  left leg 01/23/2013  . Hypertension 12/22/2012  . Hyperlipidemia with target LDL less than 100 12/22/2012  . Prediabetes 12/22/2012  . Depression with anxiety 07/15/2012  . Asthma with COPD ([place]) 10/14/2011  . GERD (gastroesophageal reflux disease) 02/10/2010  . Left knee revision arthroplasty 02/10/2010  . Stress incontinence 02/10/2010    Hebert Dooling Nilda Simmer PT, MPH  07/22/2017, 3:06 PM  [facility] [place], [place], [address] Phone: [phone]   Fax:  [phone]  Name: Sara [name] MRN: [id number] Date of Birth: 02/04/1951

## 2017-07-22 NOTE — Patient Instructions (Addendum)
Bridging    Slowly raise buttocks from floor, keeping stomach tight. Repeat ___10_ times per set. Do __1-2__ sets per session. Do __1__ sessions per day. Can bridge with theraband around thighs    Hip External Rotation With Pillow: Transverse Plane Stability  Both knees bent,  Slowly roll bent knee out. Be sure pelvis does not rotate. Do _10__ times. Restabilize pelvis. Repeat with other leg. Do _1-2__ sets, _1__ times per day.   Abduction: Clam (Eccentric) - Side-Lying    Lie on side with knees bent. Lift top knee, keeping feet together. Keep trunk steady. Slowly lower for 3-5 seconds. _10__ reps per set, __1_ sets per day    Strengthening: Hip Adduction - Isometric    With ball or folded pillow between knees, squeeze knees together. Hold ____ seconds. Repeat ____ times per set. Do ____ sets per session. Do ____ sessions per day.    Strengthening: Hip Abduction - Resisted    With tubing around right leg, other side toward anchor, extend leg out from side. Repeat __10__ times per set. Do __1-3__ sets per session. Do __1__ sessions per day.    Strengthening: Hip Extension - Resisted    With tubing around right ankle, face anchor and pull leg straight back. Repeat _10___ times per set. Do _1-3___ sets per session. Do _1___ sessions per day.

## 2017-07-31 ENCOUNTER — Ambulatory Visit (INDEPENDENT_AMBULATORY_CARE_PROVIDER_SITE_OTHER): Payer: Medicare Other | Admitting: Physical Therapy

## 2017-07-31 DIAGNOSIS — R29898 Other symptoms and signs involving the musculoskeletal system: Secondary | ICD-10-CM

## 2017-07-31 DIAGNOSIS — M25561 Pain in right knee: Secondary | ICD-10-CM

## 2017-07-31 DIAGNOSIS — M25562 Pain in left knee: Secondary | ICD-10-CM

## 2017-07-31 NOTE — Patient Instructions (Addendum)
Side Warrior Pose (Wall, Block) - Variation 2    Position pose horizontal to wall, front knee in line with toes. Pelvic tilt engaged, press knee open and out into block. Hold for __15-30__ seconds. Repeat __3-5__ times each leg.    KNEE: Quadriceps - Prone    Place strap around ankle. Bring ankle toward buttocks. Press hip into surface. Hold _30__ seconds. __2-3_ reps per set, __1-2_ sets per day, ___   Balance: Three-Way Leg Swing    Stand on left foot, hands on hips. Reach other foot forward __10__ times, sideways _10___ times, back __10__ times. Hold each position __1__ seconds. Relax.  Balance: Unilateral - Foam    Eyes open, balance with right leg on dense foam. Hold _15-30___ seconds.  Can add head turns Rt/Lt.

## 2017-07-31 NOTE — Therapy (Signed)
[place], [place], [address] Phone: [phone]   Fax:  [phone]  Physical Therapy Treatment  Patient Details  Name: Sara [name] MRN: [id number] Date of Birth: [birth year] Referring Provider: Dr. [name]  Encounter Date: 07/31/2017      PT End of Session - 07/31/17 1524    Visit Number 2   Number of Visits 6   Date for PT Re-Evaluation 09/03/17   PT Start Time 1518   PT Stop Time 9373   PT Time Calculation (min) 39 min   Activity Tolerance Patient tolerated treatment well;No increased pain      Past Medical History:  Diagnosis Date  . Asthma   . Fibromyalgia    [name]  . Hyperlipidemia   . Hypertension   . Postmenopausal bleeding     Past Surgical History:  Procedure Laterality Date  . BREAST BIOPSY  1980   neg  . TOTAL KNEE ARTHROPLASTY  2000   Right, Dr. Tonita Cong    There were no vitals filed for this visit.      Subjective Assessment - 07/31/17 1524    Subjective Pt reports things are going well.  She is still going 3x/wk and stretching every day. She hasn't had any reoccurance of pain in Rt hip or knee.    Pertinent History 2 Lt TKA; 3-4 scopes Lt knee last surgery 1/16   Patient Stated Goals pain reduction and increase flexibility and avoid surgery    Currently in Pain? No/denies            [facility] PT Assessment - 07/31/17 0001      Assessment   Medical Diagnosis Osteoarthritis Rt knee    Referring Provider Dr. [name]   Onset Date/Surgical Date 05/31/17   Hand Dominance Right   Next MD Visit 08/07/17     AROM   Right Knee Flexion 130   Left Knee Flexion 112     Flexibility   Quadriceps Lt knee 105 deg (after stretch)          [facility] Adult PT Treatment/Exercise - 07/31/17 0001      Exercises   Exercises --  pt verbally and physically went thru current ex routine.      Knee/Hip Exercises: Stretches   Passive Hamstring Stretch Right;Left;2  reps;30 seconds   Quad Stretch Left;3 reps;30 seconds  towel above knee. Rt knee 2 reps   Hip Flexor Stretch 2 reps;30 seconds  sitting on LE off edge of table    Gastroc Stretch Right;Left;2 reps;30 seconds     Knee/Hip Exercises: Aerobic   Nustep L5: 5 min      Knee/Hip Exercises: Standing   Heel Raises Both;1 set;10 reps   Functional Squat 1 set;5 reps  stopped due to pain, crepitus in Lt knee   SLS SLS on 3" pad x 30 sec with horiz head turns (challenging);  SLS with opp toe taps front, side, back x 10 reps each    Other Standing Knee Exercises Warrior 2 pose x 15 sec x 2 reps each side                 PT Education - 07/31/17 1557    Education provided Yes   Education Details HEP   Person(s) Educated Patient   Methods Explanation;Handout   Comprehension Verbalized understanding;Returned demonstration             PT Long Term Goals - 07/31/17 1530  PT LONG TERM GOAL #1   Title Initiate initial HEP with advice on gradually increasing HEP and gym program 07/22/17   Time 1   Period Days   Status Achieved     PT LONG TERM GOAL #2   Title Progress HEP program to include higher level strengthening and balance activities 09/03/17   Time 6   Period Weeks   Status On-going     PT LONG TERM GOAL #3   Title Return to prior exercise level without symptoms 09/03/17   Time 6   Period Weeks   Status Achieved     PT LONG TERM GOAL #4   Title Independent in HEP 09/03/17   Time 6   Period Weeks   Status On-going     PT LONG TERM GOAL #5   Title Improve FOTO to </= 35% limitation 09/03/17   Time 6   Period Weeks   Status On-going               Plan - 07/31/17 1623    Clinical Impression Statement Pt tolerated all exercises well without any production of symptoms, except Lt knee pain with functional squats. Pt has partially met her goals and requests to hold for 2 wks while she continues to work on her HEP, including new exercises added today.     Rehab  Potential Good   PT Frequency 1x / week   PT Duration 6 weeks   PT Treatment/Interventions Patient/family education;ADLs/Self Care Home Management;Cryotherapy;Electrical Stimulation;Iontophoresis 5m/ml Dexamethasone;Moist Heat;Ultrasound;Dry needling;Manual techniques;Therapeutic activities;Therapeutic exercise;Neuromuscular re-education   PT Next Visit Plan will hold therapy for 2 wks per pt request (until 8/15).     Consulted and Agree with Plan of Care Patient      Patient will benefit from skilled therapeutic intervention in order to improve the following deficits and impairments:  Decreased range of motion, Decreased mobility, Decreased strength, Pain, Decreased activity tolerance  Visit Diagnosis: Acute pain of right knee  Other symptoms and signs involving the musculoskeletal system     Problem List Patient Active Problem List   Diagnosis Date Noted  . Primary osteoarthritis of right hip 07/10/2017  . Primary osteoarthritis, right hand 09/05/2016  . Tinnitus 09/05/2016  . Insomnia 02/02/2016  . Benign paroxysmal positional vertigo 01/04/2016  . Primary osteoarthritis of both hips 01/04/2016  . Cervical spondylosis 12/09/2013  . Primary osteoarthritis of right knee 12/09/2013  . TMJ arthralgia 09/24/2013  . Allergic rhinitis 06/03/2013  . Annual physical exam 06/03/2013  . Low back pain radiating to left leg 01/23/2013  . Hypertension 12/22/2012  . Hyperlipidemia with target LDL less than 100 12/22/2012  . Prediabetes 12/22/2012  . Depression with anxiety 07/15/2012  . [place]hma [place] COPD [place] 10/14/2011  . GERD (gastroesophageal reflux disease) 02/10/2010  . Left knee revision arthroplasty 02/10/2010  . Stress incontinence 02/10/2010   JKerin Perna PTA 07/31/17 5:12 PM   [place] [address]Phone: [phone]  Fax:  [phone] Name: Sara BINESMRN:  [id number] of Birth: 6July 14, 1952

## 2017-08-07 ENCOUNTER — Encounter: Payer: Self-pay | Admitting: Rehabilitative and Restorative Service Providers"

## 2017-08-07 ENCOUNTER — Ambulatory Visit (INDEPENDENT_AMBULATORY_CARE_PROVIDER_SITE_OTHER): Payer: Medicare Other | Admitting: Sports Medicine

## 2017-08-07 ENCOUNTER — Encounter: Payer: Self-pay | Admitting: Sports Medicine

## 2017-08-07 DIAGNOSIS — M1611 Unilateral primary osteoarthritis, right hip: Secondary | ICD-10-CM | POA: Diagnosis not present

## 2017-08-07 DIAGNOSIS — M1711 Unilateral primary osteoarthritis, right knee: Secondary | ICD-10-CM

## 2017-08-07 NOTE — Assessment & Plan Note (Signed)
'  s AFTER injection at the last visit, doing extremely well with physical therapy.

## 2017-08-07 NOTE — Progress Notes (Signed)
  Subjective:    CC: Follow-up  HPI: Right knee osteoarthritis: Resolved with injection, doing extremely well with physical therapy.  Right hip osteoarthritis: Also now pain-free after injection at the last visit, also doing well with physical therapy.  Preventive measures: Up-to-date on mammogram.  Past medical history:  Negative.  See flowsheet/record as well for more information.  Surgical history: Negative.  See flowsheet/record as well for more information.  Family history: Negative.  See flowsheet/record as well for more information.  Social history: Negative.  See flowsheet/record as well for more information.  Allergies, and medications have been entered into the medical record, reviewed, and no changes needed.   Review of Systems: No fevers, chills, night sweats, weight loss, chest pain, or shortness of breath.   Objective:    General: Well Developed, well nourished, and in no acute distress.  Neuro: Alert and oriented x3, extra-ocular muscles intact, sensation grossly intact.  HEENT: Normocephalic, atraumatic, pupils equal round reactive to light, neck supple, no masses, no lymphadenopathy, thyroid nonpalpable.  Skin: Warm and dry, no rashes. Cardiac: Regular rate and rhythm, no murmurs rubs or gallops, no lower extremity edema.  Respiratory: Clear to auscultation bilaterally. Not using accessory muscles, speaking in full sentences.  Impression and Recommendations:    Primary osteoarthritis of right knee Resolved after injection at the last visit  Primary osteoarthritis of right hip 's AFTER injection at the last visit, doing extremely well with physical therapy.

## 2017-08-07 NOTE — Assessment & Plan Note (Signed)
Resolved after injection at the last visit 

## 2017-08-14 ENCOUNTER — Ambulatory Visit (INDEPENDENT_AMBULATORY_CARE_PROVIDER_SITE_OTHER): Payer: Medicare Other | Admitting: Rehabilitative and Restorative Service Providers"

## 2017-08-14 ENCOUNTER — Encounter: Payer: Self-pay | Admitting: Rehabilitative and Restorative Service Providers"

## 2017-08-14 DIAGNOSIS — R29898 Other symptoms and signs involving the musculoskeletal system: Secondary | ICD-10-CM | POA: Diagnosis not present

## 2017-08-14 DIAGNOSIS — M25561 Pain in right knee: Secondary | ICD-10-CM | POA: Diagnosis not present

## 2017-08-14 NOTE — Therapy (Signed)
[place], [place], [address] Phone: [phone]   Fax:  [phone]  Physical Therapy Treatment  Patient Details  Name: Sara [name] MRN: [id number] Date of Birth: 03-Oct-1951 Referring Provider: Dr [name]  Encounter Date: 08/14/2017      PT End of Session - 08/14/17 1427    Visit Number 3   Number of Visits 6   Date for PT Re-Evaluation 09/03/17   PT Start Time 1427   PT Stop Time 1510   PT Time Calculation (min) 43 min   Activity Tolerance Patient tolerated treatment well      Past Medical History:  Diagnosis Date  . Asthma   . Fibromyalgia    Sara [name]  . Hyperlipidemia   . Hypertension   . Postmenopausal bleeding     Past Surgical History:  Procedure Laterality Date  . BREAST BIOPSY  1980   neg  . TOTAL KNEE ARTHROPLASTY  2000   Right, Dr. Tonita Cong    There were no vitals filed for this visit.      Subjective Assessment - 08/14/17 1434    Subjective Doing well - no knee pain and has returned to normal functional activities including exercise in her class and at home. Has been at the gym for three hours today. Interested in yoga strap for home.    Currently in Pain? No/denies            [facility] PT Assessment - 08/14/17 0001      Assessment   Medical Diagnosis Osteoarthritis Rt knee    Referring Provider Dr [name]   Onset Date/Surgical Date 05/31/17   Hand Dominance Right   Next MD Visit 08/07/17     AROM   Right Knee Extension 0   Right Knee Flexion 130   Left Knee Extension 0   Left Knee Flexion 115     Strength   Right Hip Flexion 5/5   Right Hip Extension 5/5   Right Hip ABduction 5/5   Left Hip Flexion 5/5   Left Hip Extension 5/5   Left Hip ABduction 5/5   Right Knee Flexion 5/5   Right Knee Extension 5/5   Left Knee Flexion 5/5   Left Knee Extension 5/5     Flexibility   Quadriceps Lt knee 105 deg                       [facility] Adult PT Treatment/Exercise - 08/14/17 0001      Knee/Hip Exercises: Stretches   Quad Stretch Left;3 reps;30 seconds  towel above knee. Rt knee 2 reps   Hip Flexor Stretch 2 reps;30 seconds  sitting on LE off edge of table    Gastroc Stretch Right;Left;2 reps;30 seconds   Other Knee/Hip Stretches adductor stretch 30 sec x 2 sidelying      Knee/Hip Exercises: Aerobic   Nustep L5: 5 min      Knee/Hip Exercises: Standing   Heel Raises Both;1 set;10 reps   Functional Squat 1 set;5 reps  stopped due to pain, crepitus in Lt knee   SLS SLS on 3" pad x 30 sec with horiz head turns (challenging);  SLS with opp toe taps front, side, back x 10 reps each    Other Standing Knee Exercises Warrior 2 pose x 15 sec x 2 reps each side    Other Standing Knee Exercises standing balance and strengthening activities - see HEP  PT Education - 08-20-2017 1439    Education provided Yes   Education Details HEP    Person(s) Educated Patient   Methods Explanation;Demonstration;Tactile cues;Verbal cues;Handout   Comprehension Verbalized understanding;Returned demonstration;Verbal cues required;Tactile cues required             PT Long Term Goals - 08/20/17 1436      PT LONG TERM GOAL #1   Title Initiate initial HEP with advice on gradually increasing HEP and gym program 07/22/17   Time 1   Period Weeks   Status Achieved     PT LONG TERM GOAL #2   Title Progress HEP program to include higher level strengthening and balance activities 09/03/17   Time 6   Period Weeks   Status Achieved     PT LONG TERM GOAL #3   Title Return to prior exercise level without symptoms 09/03/17   Time 6   Period Weeks   Status Achieved     PT LONG TERM GOAL #4   Title Independent in HEP 09/03/17   Time 6   Period Weeks   Status Achieved     PT LONG TERM GOAL #5   Title Improve FOTO to </= 35% limitation 09/03/17   Time 6   Period Weeks   Status Achieved               Plan  - 2017/08/20 1459    Clinical Impression Statement Excellent progress. Goals of therapy accomplished and patient is confident in continuing independent in HEP    Rehab Potential Good   PT Frequency 1x / week   PT Duration 6 weeks   PT Treatment/Interventions Patient/family education;ADLs/Self Care Home Management;Cryotherapy;Electrical Stimulation;Iontophoresis '4mg'$ /ml Dexamethasone;Moist Heat;Ultrasound;Dry needling;Manual techniques;Therapeutic activities;Therapeutic exercise;Neuromuscular re-education   PT Next Visit Plan patient will continue with independent HEP and call with any questions    Consulted and Agree with Plan of Care Patient      Patient will benefit from skilled therapeutic intervention in order to improve the following deficits and impairments:  Decreased range of motion, Decreased mobility, Decreased strength, Pain, Decreased activity tolerance  Visit Diagnosis: Acute pain of right knee  Other symptoms and signs involving the musculoskeletal system       [facility] PT PB G-CODES - 2017/08/20 1517    Functional Assessment Tool Used  Evaluation; FOTO; clinical assessment   Functional Limitations Mobility: Walking and moving around   Mobility: Walking and Moving Around Current Status At least 20 percent but less than 40 percent impaired, limited or restricted   Mobility: Walking and Moving Around Goal Status [id number]) At least 20 percent but less than 40 percent impaired, limited or restricted   Mobility: Walking and Moving Around Discharge Status [phone]) At least 20 percent but less than 40 percent impaired, limited or restricted      Problem List Patient Active Problem List   Diagnosis Date Noted  . Primary osteoarthritis of right hip 07/10/2017  . Primary osteoarthritis, right hand 09/05/2016  . Tinnitus 09/05/2016  . Insomnia 02/02/2016  . Benign paroxysmal positional vertigo 01/04/2016  . Primary osteoarthritis of both hips 01/04/2016  . Cervical spondylosis  12/09/2013  . Primary osteoarthritis of right knee 12/09/2013  . TMJ arthralgia 09/24/2013  . Allergic rhinitis 06/03/2013  . Annual physical exam 06/03/2013  . Low back pain radiating to left leg 01/23/2013  . Hypertension 12/22/2012  . Hyperlipidemia with target LDL less than 100 12/22/2012  . Prediabetes 12/22/2012  . Depression with anxiety 07/15/2012  .  Asthma with COPD ([place]) 10/14/2011  . GERD (gastroesophageal reflux disease) 02/10/2010  . Left knee revision arthroplasty 02/10/2010  . Stress incontinence 02/10/2010    Sara [name] Sara [name] PT, MPH  08/14/2017, 3:18 PM  [facility] [place], [place], [address] Phone: [phone]   Fax:  [phone]  Name: Sara [name] MRN: [id number] Date of Birth: June 25, 1951  PHYSICAL THERAPY DISCHARGE SUMMARY  Visits from Start of Care: 3  Current functional level related to goals / functional outcomes: See progress note for discharge status   Remaining deficits: See note    Education / Equipment: HEP  Plan: Patient agrees to discharge.  Patient goals were met. Patient is being discharged due to meeting the stated rehab goals.  ?????    Sara [name] P. Helene Kelp PT, MPH 08/14/17 3:19 PM

## 2017-08-14 NOTE — Patient Instructions (Addendum)
Balance: Unilateral    Attempt to balance on left leg, eyes open. Hold ____ seconds. Repeat ____ times per set. Do ____ sets per session. Do ____ sessions per day. Perform exercise with eyes closed. perform turning head side to side Perform looking up and down    Heel Raise: Unilateral (Standing)    Balance on left foot, then rise on ball of foot. Repeat __10__ times per set. Do __1-2__ sets per session. Do __1__ sessions per day.    Balance: Unilateral - Foam    Eyes open, balance with right leg on dense foam. Hold _20-30___ seconds. Repeat __3-5__ times per set. Do __1-2__ sets per session. Do __1__ sessions per day. Perform exercise with eyes closed.   Balance: Unilateral - Forward Lean    Stand on left foot, hands on hips. Keeping hips level, bend forward as if to touch forehead to wall. Hold _30___ seconds. Relax. Repeat __3-5__ times per set. Do __1-3__ sets per session. Do __1__ sessions per day. Can lift light object and place side to side forward and back etc   Balance / Reach    Stand on left foot, Holding __1/2 to 1 __ pound weight in other hand. Bend knee, lowering body, and reach across. Relax. Repeat __5__ times per set. Do __1-3__ sets per session. Do __1_ sessions per day.   Bend knee slightly with toe taps   Stand on one foot bouncing ball on wall

## 2017-09-08 ENCOUNTER — Encounter: Payer: Self-pay | Admitting: Sports Medicine

## 2017-09-10 ENCOUNTER — Other Ambulatory Visit: Payer: Self-pay | Admitting: Sports Medicine

## 2017-09-10 DIAGNOSIS — T8484XS Pain due to internal orthopedic prosthetic devices, implants and grafts, sequela: Secondary | ICD-10-CM

## 2017-09-10 DIAGNOSIS — Z96652 Presence of left artificial knee joint: Secondary | ICD-10-CM

## 2017-09-26 ENCOUNTER — Ambulatory Visit (INDEPENDENT_AMBULATORY_CARE_PROVIDER_SITE_OTHER): Payer: Medicare Other | Admitting: Sports Medicine

## 2017-09-26 VITALS — BP 124/74 | HR 81

## 2017-09-26 DIAGNOSIS — Z23 Encounter for immunization: Secondary | ICD-10-CM

## 2017-09-26 NOTE — Progress Notes (Signed)
Patient came into clinic today for flu vaccination. Patient was given a flu shot questionnaire prior to administration of immunization. All questions were answered no. Patient tolerated injection of flu immunization in right deltoid well, with no immediate complications. An information pamphlet regarding flu shot immunization and frequently asked questions was given to Patient prior to leaving clinic. Advised to contact our office with any questions/concerns.  

## 2017-10-02 ENCOUNTER — Other Ambulatory Visit: Payer: Self-pay | Admitting: Sports Medicine

## 2017-10-08 ENCOUNTER — Other Ambulatory Visit: Payer: Self-pay | Admitting: Sports Medicine

## 2017-10-09 ENCOUNTER — Other Ambulatory Visit: Payer: Self-pay

## 2017-10-09 DIAGNOSIS — G4700 Insomnia, unspecified: Secondary | ICD-10-CM

## 2017-10-09 MED ORDER — ZOLPIDEM TARTRATE 10 MG PO TABS: 10.0000 mg | ORAL_TABLET | Freq: Every day | ORAL | 3 refills | Status: DC

## 2017-10-23 ENCOUNTER — Encounter: Payer: Self-pay | Admitting: Sports Medicine

## 2017-10-24 ENCOUNTER — Ambulatory Visit (INDEPENDENT_AMBULATORY_CARE_PROVIDER_SITE_OTHER): Payer: Medicare Other | Admitting: Sports Medicine

## 2017-10-24 DIAGNOSIS — J449 Chronic obstructive pulmonary disease, unspecified: Secondary | ICD-10-CM

## 2017-10-24 DIAGNOSIS — R5381 Other malaise: Secondary | ICD-10-CM | POA: Diagnosis not present

## 2017-10-24 DIAGNOSIS — J4521 Mild intermittent asthma with (acute) exacerbation: Secondary | ICD-10-CM

## 2017-10-24 MED ORDER — ALBUTEROL SULFATE (2.5 MG/3ML) 0.083% IN NEBU: 2.5000 mg | INHALATION_SOLUTION | RESPIRATORY_TRACT | 6 refills | Status: DC | PRN

## 2017-10-24 MED ORDER — ALBUTEROL SULFATE HFA 108 (90 BASE) MCG/ACT IN AERS: 2.0000 | INHALATION_SPRAY | RESPIRATORY_TRACT | 1 refills | Status: DC | PRN

## 2017-10-24 NOTE — Assessment & Plan Note (Addendum)
Fairly nonspecific symptoms. Checking blood work and urinalysis downstairs. I do think this is probably a viral infection. If no answers from the labs we will consider this menopausal hot flashes, and she can discuss this with her OB/GYN, she does currently use an estrogen patch, she is already on gabapentin, ultimately we may have to add an SSRI if we determined this to be vasomotor instability.  Urinalysis is positive for trace leukocytes, white blood cell count is high, CK level is slightly high suggesting mild rhabdomyolysis, please add a urine culture to the specimen already in the lab, I am going to add Macrobid to the regimen.  Awaiting other labs.

## 2017-10-24 NOTE — Progress Notes (Addendum)
  Subjective:    CC: Feeling bad  HPI: For the past couple of weeks this pleasant 66 year old female has had symptoms of malaise, very nonspecific, widespread myalgias, fatigue, occasional hot flashes, she is noted her heart rate is higher than usual when exercising, no sick contacts, mild cough, only minimal abdominal pain.  No diarrhea, she does have some constipation, no vaginal discharge, no dysuria, urgency, frequency.  Cough is nonproductive.  Past medical history:  Negative.  See flowsheet/record as well for more information.  Surgical history: Negative.  See flowsheet/record as well for more information.  Family history: Negative.  See flowsheet/record as well for more information.  Social history: Negative.  See flowsheet/record as well for more information.  Allergies, and medications have been entered into the medical record, reviewed, and no changes needed.   Review of Systems: No fevers, chills, night sweats, weight loss, chest pain, or shortness of breath.   Objective:    General: Well Developed, well nourished, and in no acute distress.  Neuro: Alert and oriented x3, extra-ocular muscles intact, sensation grossly intact.  HEENT: Normocephalic, atraumatic, pupils equal round reactive to light, neck supple, no masses, no lymphadenopathy, thyroid nonpalpable.  Skin: Warm and dry, no rashes. Cardiac: Regular rate and rhythm, no murmurs rubs or gallops, no lower extremity edema.  Respiratory: Clear to auscultation bilaterally. Not using accessory muscles, speaking in full sentences. Abdomen: Soft, nontender, nondistended, no bowel sounds, no palpable masses, guarding, rigidity, rebound tenderness.  Impression and Recommendations:    Malaise Fairly nonspecific symptoms. Checking blood work and urinalysis downstairs. I do think this is probably a viral infection. If no answers from the labs we will consider this menopausal hot flashes, and she can discuss this with her OB/GYN,  she does currently use an estrogen patch, she is already on gabapentin, ultimately we may have to add an SSRI if we determined this to be vasomotor instability.  Urinalysis is positive for trace leukocytes, white blood cell count is high, CK level is slightly high suggesting mild rhabdomyolysis, please add a urine culture to the specimen already in the lab, I am going to add Macrobid to the regimen.  Awaiting other labs.  I spent 25 minutes with this patient, greater than 50% was face-to-face time counseling regarding the above diagnoses ___________________________________________ [name]. [name], M.D., ABFM., CAQSM. Primary Care and [place] Instructor of [place] of [facility] [employer]

## 2017-10-25 LAB — URINE CULTURE
MICRO NUMBER:: 81197901
SPECIMEN QUALITY:: ADEQUATE

## 2017-10-25 MED ORDER — NITROFURANTOIN MONOHYD MACRO 100 MG PO CAPS: 100.0000 mg | ORAL_CAPSULE | Freq: Two times a day (BID) | ORAL | 0 refills | Status: DC

## 2017-10-25 NOTE — Addendum Note (Signed)
Addended by: Silverio Decamp on: 10/25/2017 09:13 AM   Modules accepted: Orders

## 2017-10-28 LAB — CBC WITH DIFFERENTIAL/PLATELET
Basophils Absolute: 70 cells/uL (ref 0–200)
Basophils Relative: 0.6 %
Eosinophils Absolute: 174 cells/uL (ref 15–500)
Eosinophils Relative: 1.5 %
HCT: 37.3 % (ref 35.0–45.0)
Hemoglobin: 12.9 g/dL (ref 11.7–15.5)
Lymphs Abs: 3770 cells/uL (ref 850–3900)
MCH: 31.9 pg (ref 27.0–33.0)
MCHC: 34.6 g/dL (ref 32.0–36.0)
MCV: 92.1 fL (ref 80.0–100.0)
MPV: 9.4 fL (ref 7.5–12.5)
Monocytes Relative: 5.6 %
Neutro Abs: 6937 cells/uL (ref 1500–7800)
Neutrophils Relative %: 59.8 %
Platelets: 362 Thousand/uL (ref 140–400)
RBC: 4.05 Million/uL (ref 3.80–5.10)
RDW: 11.9 % (ref 11.0–15.0)
Total Lymphocyte: 32.5 %
WBC mixed population: 650 cells/uL (ref 200–950)
WBC: 11.6 Thousand/uL — ABNORMAL HIGH (ref 3.8–10.8)

## 2017-10-28 LAB — COMPREHENSIVE METABOLIC PANEL
AG Ratio: 1.7 (calc) (ref 1.0–2.5)
AST: 18 U/L (ref 10–35)
Alkaline phosphatase (APISO): 57 U/L (ref 33–130)
BUN/Creatinine Ratio: 17 (calc) (ref 6–22)
CO2: 30 mmol/L (ref 20–32)
Chloride: 102 mmol/L (ref 98–110)
Creat: 1.01 mg/dL — ABNORMAL HIGH (ref 0.50–0.99)
Globulin: 2.5 g/dL (calc) (ref 1.9–3.7)
Total Bilirubin: 0.7 mg/dL (ref 0.2–1.2)

## 2017-10-28 LAB — URINALYSIS
Bilirubin Urine: NEGATIVE
Glucose, UA: NEGATIVE
Hgb urine dipstick: NEGATIVE
Nitrite: NEGATIVE
Specific Gravity, Urine: 1.028 (ref 1.001–1.03)
pH: 7 (ref 5.0–8.0)

## 2017-10-28 LAB — EPSTEIN-BARR VIRUS VCA, IGG: EBV VCA IgG: >750 U/mL — ABNORMAL HIGH

## 2017-10-28 LAB — EPSTEIN-BARR VIRUS VCA, IGM: EBV VCA IgM: <36 U/mL

## 2017-10-28 LAB — COMPREHENSIVE METABOLIC PANEL WITH GFR
ALT: 12 U/L (ref 6–29)
Albumin: 4.3 g/dL (ref 3.6–5.1)
BUN: 17 mg/dL (ref 7–25)
Calcium: 9.3 mg/dL (ref 8.6–10.4)
Glucose, Bld: 94 mg/dL (ref 65–99)
Potassium: 3.7 mmol/L (ref 3.5–5.3)
Sodium: 139 mmol/L (ref 135–146)
Total Protein: 6.8 g/dL (ref 6.1–8.1)

## 2017-10-28 LAB — TSH: TSH: 1.22 mIU/L (ref 0.40–4.50)

## 2017-10-28 LAB — CMV IGM: CMV IgM: <30 AU/mL

## 2017-10-28 LAB — CK: Total CK: 177 U/L — ABNORMAL HIGH (ref 29–143)

## 2017-10-28 LAB — INFLUENZA A & B ANTIBODIES
INFLUENZA TYPE A ANTIBODY SERUM: 1:64 {titer} — ABNORMAL HIGH
INFLUENZA TYPE B ANTIBODY SERUM: 1:32 {titer} — ABNORMAL HIGH

## 2017-10-31 ENCOUNTER — Encounter: Payer: Self-pay | Admitting: Sports Medicine

## 2017-10-31 MED ORDER — PREDNISONE 50 MG PO TABS: 50.0000 mg | ORAL_TABLET | Freq: Every day | ORAL | 0 refills | Status: DC

## 2017-11-03 ENCOUNTER — Other Ambulatory Visit: Payer: Self-pay | Admitting: Sports Medicine

## 2017-11-07 ENCOUNTER — Encounter: Payer: Self-pay | Admitting: Sports Medicine

## 2017-11-07 ENCOUNTER — Ambulatory Visit: Payer: Medicare Other | Admitting: Sports Medicine

## 2017-11-07 DIAGNOSIS — R5381 Other malaise: Secondary | ICD-10-CM | POA: Diagnosis not present

## 2017-11-07 NOTE — Progress Notes (Signed)
  Subjective:    CC: Follow-up  HPI: This is a pleasant 66 year old female, I saw her for malaise, cough, muscle aches.  Lab testing revealed positive influenza titers, overall she is improving.  Past medical history:  Negative.  See flowsheet/record as well for more information.  Surgical history: Negative.  See flowsheet/record as well for more information.  Family history: Negative.  See flowsheet/record as well for more information.  Social history: Negative.  See flowsheet/record as well for more information.  Allergies, and medications have been entered into the medical record, reviewed, and no changes needed.   Review of Systems: No fevers, chills, night sweats, weight loss, chest pain, or shortness of breath.   Objective:    General: Well Developed, well nourished, and in no acute distress.  Neuro: Alert and oriented x3, extra-ocular muscles intact, sensation grossly intact.  HEENT: Normocephalic, atraumatic, pupils equal round reactive to light, neck supple, no masses, no lymphadenopathy, thyroid nonpalpable.  Skin: Warm and dry, no rashes. Cardiac: Regular rate and rhythm, no murmurs rubs or gallops, no lower extremity edema.  Respiratory: Clear to auscultation bilaterally. Not using accessory muscles, speaking in full sentences.  Impression and Recommendations:    Malaise The titers came back positive, she was out of the window for Tamiflu. We did a burst of prednisone, overall starting to feel better. Return as needed.  I spent 25 minutes with this patient, greater than 50% was face-to-face time counseling regarding the above diagnoses ___________________________________________ [name]. [name], M.D., ABFM., CAQSM. Primary Care and [place] Instructor of [place] of [facility] of [employer]

## 2017-11-07 NOTE — Assessment & Plan Note (Signed)
The titers came back positive, she was out of the window for Tamiflu. We did a burst of prednisone, overall starting to feel better. Return as needed.

## 2017-11-15 ENCOUNTER — Other Ambulatory Visit: Payer: Self-pay | Admitting: Sports Medicine

## 2017-11-27 ENCOUNTER — Encounter: Payer: Self-pay | Admitting: Sports Medicine

## 2018-01-03 ENCOUNTER — Encounter: Payer: Self-pay | Admitting: Sports Medicine

## 2018-01-03 DIAGNOSIS — G4700 Insomnia, unspecified: Secondary | ICD-10-CM

## 2018-01-03 MED ORDER — ZOLPIDEM TARTRATE 10 MG PO TABS: 10.0000 mg | ORAL_TABLET | Freq: Every day | ORAL | 0 refills | Status: DC

## 2018-01-28 ENCOUNTER — Ambulatory Visit: Payer: Self-pay | Admitting: Sports Medicine

## 2018-01-28 DIAGNOSIS — Z0189 Encounter for other specified special examinations: Secondary | ICD-10-CM

## 2018-01-31 ENCOUNTER — Encounter: Payer: Self-pay | Admitting: Sports Medicine

## 2018-01-31 ENCOUNTER — Ambulatory Visit: Payer: Medicare Other | Admitting: Sports Medicine

## 2018-01-31 DIAGNOSIS — M160 Bilateral primary osteoarthritis of hip: Secondary | ICD-10-CM

## 2018-01-31 DIAGNOSIS — M545 Low back pain: Secondary | ICD-10-CM

## 2018-01-31 DIAGNOSIS — Z96652 Presence of left artificial knee joint: Secondary | ICD-10-CM | POA: Diagnosis not present

## 2018-01-31 DIAGNOSIS — M26623 Arthralgia of bilateral temporomandibular joint: Secondary | ICD-10-CM

## 2018-01-31 DIAGNOSIS — M79605 Pain in left leg: Secondary | ICD-10-CM

## 2018-01-31 MED ORDER — MELOXICAM 15 MG PO TABS: ORAL_TABLET | ORAL | 3 refills | Status: DC

## 2018-01-31 MED ORDER — TRAMADOL HCL 50 MG PO TABS: ORAL_TABLET | ORAL | 3 refills | Status: DC

## 2018-01-31 NOTE — Assessment & Plan Note (Signed)
Getting a new lumbar spine MRI, she has had multiple left-sided interlaminar L5-S1 epidurals which seemed to work well. Return to go over MRI results and discuss injection.

## 2018-01-31 NOTE — Assessment & Plan Note (Signed)
Persistent pain after revision arthroplasty in the left knee. She would like a second opinion from a different orthopedist.

## 2018-01-31 NOTE — Progress Notes (Signed)
Subjective:    CC: Multiple issues  HPI: Right hip pain: Known hip osteoarthritis, we have not injected her hip in 6 months.  Pain is localized in the right groin, moderate, persistent with gelling.  Left knee revision arthroplasty: Persistent pain, desires second opinion from another orthopedist.  Low back pain: Known lumbar degenerative disc disease at L5-S1, she was getting L5-S1 epidurals with Dr. Nelva Bush.  Agreeable to transition care here.  I reviewed the past medical history, family history, social history, surgical history, and allergies today and no changes were needed.  Please see the problem list section below in epic for further details.  Past Medical History: Past Medical History:  Diagnosis Date  . Asthma   . Fibromyalgia    [name]  . Hyperlipidemia   . Hypertension   . Postmenopausal bleeding    Past Surgical History: Past Surgical History:  Procedure Laterality Date  . BREAST BIOPSY  1980   neg  . TOTAL KNEE ARTHROPLASTY  2000   Right, Dr. Tonita Cong   Social History: Social History   Socioeconomic History  . Marital status: Married    Spouse name: None  . Number of children: 4  . Years of education: None  . Highest education level: None  Social Needs  . Financial resource strain: None  . Food insecurity - worry: None  . Food insecurity - inability: None  . Transportation needs - medical: None  . Transportation needs - non-medical: None  Occupational History  . 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  Tobacco Use  . Smoking status: Never Smoker  . Smokeless tobacco: Never Used  Substance and Sexual Activity  . Alcohol use: Yes    Comment: Occasional-Last had a drink a month ago  . Drug use: No  . Sexual activity: No    Partners: Male  Other Topics Concern  . None  Social History Narrative   Married to [place].    Has grown children, daughter in [place].   Walks for exercise.    Family History: Family History  Problem Relation Age of Onset  . Diabetes Mother   . Heart attack Mother        First MI at age 10  . Diabetes type II Mother   . Coronary artery disease Mother   . Hypertension Father   . Diabetes Father   . Alcohol abuse Father   . Pancreatitis Father   . Asthma Daughter   . Asthma Grandchild    Allergies: Allergies  Allergen Reactions  . Bee Venom Anaphylaxis   Medications: See med rec.  Review of Systems: No fevers, chills, night sweats, weight loss, chest pain, or shortness of breath.   Objective:    General: Well Developed, well nourished, and in no acute distress.  Neuro: Alert and oriented x3, extra-ocular muscles intact, sensation grossly intact.  HEENT: Normocephalic, atraumatic, pupils equal round reactive to light, neck supple, no masses, no lymphadenopathy, thyroid nonpalpable.  Skin: Warm and dry, no rashes. Cardiac: Regular rate and rhythm, no murmurs rubs or gallops, no lower extremity edema.  Respiratory: Clear to auscultation bilaterally. Not using accessory muscles, speaking in full sentences.  Impression and Recommendations:    Primary osteoarthritis of both hips Did well after hip injections in 2018. Continue meloxicam as needed. Hip rehab exercises, we can do another injection if the pain is persistent.  Left knee revision arthroplasty Persistent pain after revision arthroplasty in the left knee.  She would like a second opinion from a different orthopedist.  Low back pain radiating to left leg Getting a new lumbar spine MRI, she has had multiple left-sided interlaminar L5-S1 epidurals which seemed to work well. Return to go over MRI results and discuss injection. ___________________________________________ [name]. [name], M.D., ABFM., CAQSM. Primary Care and [place] Instructor of [place] of [facility]

## 2018-01-31 NOTE — Assessment & Plan Note (Signed)
Did well after hip injections in 2018. Continue meloxicam as needed. Hip rehab exercises, we can do another injection if the pain is persistent.

## 2018-02-03 ENCOUNTER — Ambulatory Visit (INDEPENDENT_AMBULATORY_CARE_PROVIDER_SITE_OTHER): Payer: Medicare Other

## 2018-02-03 DIAGNOSIS — M5116 Intervertebral disc disorders with radiculopathy, lumbar region: Secondary | ICD-10-CM | POA: Diagnosis not present

## 2018-02-03 DIAGNOSIS — M545 Low back pain, unspecified: Secondary | ICD-10-CM

## 2018-02-03 DIAGNOSIS — M79605 Pain in left leg: Secondary | ICD-10-CM

## 2018-02-07 ENCOUNTER — Ambulatory Visit: Payer: Medicare Other | Admitting: Sports Medicine

## 2018-02-07 ENCOUNTER — Encounter: Payer: Self-pay | Admitting: Sports Medicine

## 2018-02-07 DIAGNOSIS — M47816 Spondylosis without myelopathy or radiculopathy, lumbar region: Secondary | ICD-10-CM

## 2018-02-07 DIAGNOSIS — M160 Bilateral primary osteoarthritis of hip: Secondary | ICD-10-CM

## 2018-02-07 NOTE — Assessment & Plan Note (Signed)
Has had several L5-S1 interlaminar epidurals with Dr. Nelva Bush, did not really work all that well. We are going to shift our focus to her facet joints. Adding L3-S1 medial branch blocks in anticipation of radiofrequency ablation bilaterally.

## 2018-02-07 NOTE — Addendum Note (Signed)
Addended by: Silverio Decamp on: 02/07/2018 10:37 AM   Modules accepted: Orders

## 2018-02-07 NOTE — Assessment & Plan Note (Signed)
Patient had hip joint injections about 7 months ago. Having recurrence of pain in the right groin, referrable to the hip joint. Oral NSAIDs and analgesics are not effective so she is agreeable to proceed to another right hip joint injection.

## 2018-02-07 NOTE — Progress Notes (Addendum)
Subjective:    CC: Follow-up  HPI: We did a lumbar spine MRI in [place] [name], [name] back is hurting but [name] main issue today is [name] right hip joint, she has pain that is moderate, persistent, localized in the groin with radiation to the anterior thigh.  She has known hip osteoarthritis, [name] last injection was about 7 months ago.  At this point oral analgesics are not effective, she desires interventional treatment.  No mechanical symptoms, no trauma.  At the end of the visit she brought up [name] back pain, we did obtain an MRI, she did have an L5-S1 degenerative disc disease, no overt foraminal stenosis, but she did have multilevel facet arthritis, [name] pain is better with bending forward, worse with standing up straight and leaning back consistent with facet agenic type pain.  Nothing radicular.  I reviewed the past medical history, family history, social history, surgical history, and allergies today and no changes were needed.  Please see the problem list section below in epic for further details.  Past Medical History: Past Medical History:  Diagnosis Date  . Asthma   . Fibromyalgia    Sara [name]  . Hyperlipidemia   . Hypertension   . Postmenopausal bleeding    Past Surgical History: Past Surgical History:  Procedure Laterality Date  . BREAST BIOPSY  1980   neg  . TOTAL KNEE ARTHROPLASTY  2000   Right, Dr. Tonita [name]   Social History: Social History   Socioeconomic History  . Marital status: Married    Spouse name: None  . Number of children: 4  . Years of education: None  . Highest education level: None  Social Needs  . [name] resource strain: None  . Food insecurity - worry: None  . Food insecurity - inability: None  . Transportation needs - medical: None  . Transportation needs - non-medical: None  Occupational History  . Occupation: Product manager: Sara [name]: Sara [name].  Tobacco Use  . Smoking status: Never Smoker  .  Smokeless tobacco: Never Used  Substance and Sexual Activity  . Alcohol use: Yes    Comment: Occasional-Last had a drink a month ago  . Drug use: No  . Sexual activity: No    Partners: Male  Other Topics Concern  . None  Social History Narrative   Married to Sara [name].    Has grown children, daughter in [place].   Walks for exercise.   Family History: Family History  Problem Relation Age of Onset  . Diabetes Mother   . Heart attack Mother        First MI at age 52  . Diabetes type II Mother   . Coronary artery disease Mother   . Hypertension Father   . Diabetes Father   . Alcohol abuse Father   . Pancreatitis Father   . Asthma Daughter   . Asthma Grandchild    Allergies: Allergies  Allergen Reactions  . Bee Venom Anaphylaxis   Medications: See med rec.  Review of Systems: No fevers, chills, night sweats, weight loss, chest pain, or shortness of breath.   Objective:    General: Well Developed, well nourished, and in no acute distress.  Neuro: Alert and oriented x3, extra-ocular muscles intact, sensation grossly intact.  HEENT: Normocephalic, atraumatic, pupils equal round reactive to light, neck supple, no masses, no lymphadenopathy, thyroid nonpalpable.  Skin: Warm and dry, no rashes. Cardiac: Regular rate and rhythm, no murmurs rubs or gallops, no  lower extremity edema.  Respiratory: Clear to auscultation bilaterally. Not using accessory muscles, speaking in full sentences. Right hip: ROM IR: 40 degrees with reproduction of pain, ER: 60 Deg, Flexion: 120 Deg, Extension: 100 Deg, Abduction: 45 Deg, Adduction: 45 Deg Strength IR: 5/5, ER: 5/5, Flexion: 5/5, Extension: 5/5, Abduction: 5/5, Adduction: 5/5 Pelvic alignment unremarkable to inspection and palpation. Standing hip rotation and gait without trendelenburg / unsteadiness. Greater trochanter without tenderness to palpation. No tenderness over piriformis. No SI joint tenderness and normal minimal SI  movement.  Lumbar spine MRI personally reviewed, L5-S1 degenerative disc disease without overt or severe foraminal stenosis, no central stenosis, she did have arthritic facets from L3-S1.  Procedure: Real-time Ultrasound Guided Injection of right femoroacetabular joint Device: GE Logiq E  Verbal informed consent obtained.  Time-out conducted.  Noted no overlying erythema, induration, or other signs of local infection.  Skin prepped in a sterile fashion.  Local anesthesia: Topical Ethyl chloride.  With sterile technique and under real time ultrasound guidance: Using a 22-gauge spinal needle advanced to the femoral head/neck junction, contacted bone to ensure penetration of the capsule and injected 1 cc kenalog 40, 2 cc lidocaine, 2 cc bupivacaine. Completed without difficulty  Pain immediately resolved suggesting accurate placement of the medication.  Advised to call if fevers/chills, erythema, induration, drainage, or persistent bleeding.  Images permanently stored and available for review in the ultrasound unit.  Impression: Technically successful ultrasound guided injection.  Impression and Recommendations:    Primary osteoarthritis of both hips Patient had hip joint injections about 7 months ago. Having recurrence of pain in the right groin, referrable to the hip joint. Oral NSAIDs and analgesics are not effective so she is agreeable to proceed to another right hip joint injection.  Lumbar spondylosis Has had several L5-S1 interlaminar epidurals with Dr. Nelva Bush, did not really work all that well. We are going to shift our focus to [name] facet joints. Adding L3-S1 medial branch blocks in anticipation of radiofrequency ablation bilaterally. ___________________________________________ Sara [name]. [name], M.D., ABFM., CAQSM. Primary Care and [place] Instructor of [place] of [facility] of Medicine

## 2018-02-10 ENCOUNTER — Other Ambulatory Visit: Payer: Self-pay | Admitting: Sports Medicine

## 2018-02-10 DIAGNOSIS — M47816 Spondylosis without myelopathy or radiculopathy, lumbar region: Secondary | ICD-10-CM

## 2018-02-17 ENCOUNTER — Encounter: Payer: Self-pay | Admitting: Sports Medicine

## 2018-02-18 ENCOUNTER — Encounter: Payer: Self-pay | Admitting: Sports Medicine

## 2018-03-09 ENCOUNTER — Other Ambulatory Visit: Payer: Self-pay | Admitting: Sports Medicine

## 2018-03-11 ENCOUNTER — Other Ambulatory Visit: Payer: Self-pay | Admitting: Sports Medicine

## 2018-03-11 ENCOUNTER — Ambulatory Visit
Admission: RE | Admit: 2018-03-11 | Discharge: 2018-03-11 | Disposition: A | Discharge status: Home / Self Care | Payer: Medicare Other | Source: Ambulatory Visit | Attending: Sports Medicine | Admitting: Sports Medicine

## 2018-03-11 DIAGNOSIS — M47816 Spondylosis without myelopathy or radiculopathy, lumbar region: Secondary | ICD-10-CM

## 2018-03-14 ENCOUNTER — Other Ambulatory Visit: Payer: Self-pay | Admitting: Sports Medicine

## 2018-03-14 ENCOUNTER — Ambulatory Visit
Admission: RE | Admit: 2018-03-14 | Discharge: 2018-03-14 | Disposition: A | Discharge status: Home / Self Care | Payer: Medicare Other | Source: Ambulatory Visit | Attending: Sports Medicine | Admitting: Sports Medicine

## 2018-03-14 ENCOUNTER — Encounter: Payer: Self-pay | Admitting: Sports Medicine

## 2018-03-14 VITALS — BP 117/73 | HR 68 | Resp 20

## 2018-03-14 DIAGNOSIS — M47816 Spondylosis without myelopathy or radiculopathy, lumbar region: Secondary | ICD-10-CM

## 2018-03-14 MED ORDER — MIDAZOLAM HCL 2 MG/2ML IJ SOLN
1.0000 mg | INTRAMUSCULAR | Status: DC | PRN
  Administered 2018-03-14 (×3): 1 mg via INTRAVENOUS

## 2018-03-14 MED ORDER — SODIUM CHLORIDE 0.9 % IV SOLN
Freq: Once | INTRAVENOUS | Status: AC
  Administered 2018-03-14: 08:00:00 via INTRAVENOUS

## 2018-03-14 MED ORDER — KETOROLAC TROMETHAMINE 30 MG/ML IJ SOLN
30.0000 mg | Freq: Once | INTRAMUSCULAR | Status: AC
  Administered 2018-03-14: 30 mg via INTRAVENOUS

## 2018-03-14 MED ORDER — METHYLPREDNISOLONE ACETATE 40 MG/ML INJ SUSP (RADIOLOG
120.0000 mg | Freq: Once | INTRAMUSCULAR | Status: AC
  Administered 2018-03-14: 120 mg via INTRALESIONAL

## 2018-03-14 MED ORDER — FENTANYL CITRATE (PF) 100 MCG/2ML IJ SOLN
25.0000 ug | INTRAMUSCULAR | Status: DC | PRN
  Administered 2018-03-14: 25 ug via INTRAVENOUS

## 2018-03-14 NOTE — Progress Notes (Signed)
Pt tolerated procedure well. Pt currently awake alert, speaking in complete sentences. Pt reports minimal pain.

## 2018-03-14 NOTE — Discharge Instructions (Signed)
Radio Frequency Ablation Post Procedure Discharge Instructions ° °1. May resume a regular diet and any medications that you routinely take (including pain medications). °2. No driving day of procedure. °3. Upon discharge go home and rest for at least 4 hours.  May use an ice pack as needed to injection sites on back. °4. Remove bandades later, today. ° ° ° °Please contact our office at [phone] for the following symptoms: ° °· Fever greater than 100 degrees °· Increased swelling, pain, or redness at injection site. ° ° °Thank you for visiting [facility]. °

## 2018-03-26 ENCOUNTER — Other Ambulatory Visit: Payer: Self-pay | Admitting: Sports Medicine

## 2018-04-24 ENCOUNTER — Other Ambulatory Visit: Payer: Self-pay | Admitting: Sports Medicine

## 2018-04-24 DIAGNOSIS — M545 Low back pain: Secondary | ICD-10-CM

## 2018-04-24 DIAGNOSIS — M79605 Pain in left leg: Secondary | ICD-10-CM

## 2018-05-01 ENCOUNTER — Other Ambulatory Visit: Payer: Self-pay

## 2018-05-01 MED ORDER — LISINOPRIL-HYDROCHLOROTHIAZIDE 20-25 MG PO TABS: ORAL_TABLET | ORAL | 2 refills | Status: DC

## 2018-05-05 ENCOUNTER — Other Ambulatory Visit: Payer: Self-pay | Admitting: Sports Medicine

## 2018-05-09 ENCOUNTER — Other Ambulatory Visit: Payer: Self-pay | Admitting: Sports Medicine

## 2018-05-29 ENCOUNTER — Encounter: Payer: Self-pay | Admitting: Sports Medicine

## 2018-05-29 ENCOUNTER — Other Ambulatory Visit: Payer: Self-pay | Admitting: Sports Medicine

## 2018-05-29 DIAGNOSIS — G4700 Insomnia, unspecified: Secondary | ICD-10-CM

## 2018-05-29 DIAGNOSIS — M26623 Arthralgia of bilateral temporomandibular joint: Secondary | ICD-10-CM

## 2018-05-29 DIAGNOSIS — Z1159 Encounter for screening for other viral diseases: Secondary | ICD-10-CM

## 2018-05-30 MED ORDER — SCOPOLAMINE 1 MG/3DAYS TD PT72: 1.0000 | MEDICATED_PATCH | TRANSDERMAL | 3 refills | Status: DC

## 2018-06-16 ENCOUNTER — Other Ambulatory Visit: Payer: Self-pay | Admitting: Sports Medicine

## 2018-06-20 ENCOUNTER — Other Ambulatory Visit: Payer: Self-pay | Admitting: Sports Medicine

## 2018-06-20 DIAGNOSIS — M79605 Pain in left leg: Secondary | ICD-10-CM

## 2018-06-20 DIAGNOSIS — M545 Low back pain, unspecified: Secondary | ICD-10-CM

## 2018-07-15 ENCOUNTER — Encounter: Payer: Self-pay | Admitting: Sports Medicine

## 2018-07-15 ENCOUNTER — Ambulatory Visit (INDEPENDENT_AMBULATORY_CARE_PROVIDER_SITE_OTHER): Payer: Medicare Other | Admitting: Sports Medicine

## 2018-07-15 VITALS — BP 128/85 | HR 94 | Ht 65.0 in | Wt 205.0 lb

## 2018-07-15 DIAGNOSIS — M25562 Pain in left knee: Secondary | ICD-10-CM | POA: Diagnosis not present

## 2018-07-15 DIAGNOSIS — E785 Hyperlipidemia, unspecified: Secondary | ICD-10-CM

## 2018-07-15 DIAGNOSIS — R7303 Prediabetes: Secondary | ICD-10-CM

## 2018-07-15 DIAGNOSIS — Z23 Encounter for immunization: Secondary | ICD-10-CM

## 2018-07-15 DIAGNOSIS — Z96652 Presence of left artificial knee joint: Secondary | ICD-10-CM

## 2018-07-15 DIAGNOSIS — Z0000 Encounter for general adult medical examination without abnormal findings: Secondary | ICD-10-CM | POA: Diagnosis not present

## 2018-07-15 NOTE — Progress Notes (Signed)
Subjective:    Sara [name] is a 67 y.o. female who presents for a Welcome to Medicare exam.   She is doing well, she desires an ECG, routine labs.  In addition she has a history of a left knee revision arthroplasty still with some pain, desires interventional treatment today, no fevers, chills, no swelling.  Just mild discomfort.  Localized without radiation, present for a few months.  Review of Systems A comprehensive of review of systems was negative except as noted above        Objective:    Today's Vitals   07/15/18 0937  BP: 128/85  Pulse: 94  Weight: 205 lb (93 kg)  Height: 5\' 5"  (1.651 m)  Body mass index is 34.11 kg/m.  Medications Outpatient Encounter Medications as of 07/15/2018  Medication Sig  . albuterol (PROAIR HFA) 108 (90 Base) MCG/ACT inhaler Inhale 2 puffs into the lungs every 4 (four) hours as needed for wheezing or shortness of breath.  Marland Kitchen albuterol (PROVENTIL) (2.5 MG/3ML) 0.083% nebulizer solution Take 3 mLs (2.5 mg total) by nebulization every 4 (four) hours as needed for wheezing or shortness of breath (please include nebulizer machine, hoses, and mask if needed.).  Marland Kitchen aspirin 81 MG tablet Take 81 mg by mouth daily.    Marland Kitchen atorvastatin (LIPITOR) 10 MG tablet TAKE 1 TABLET (10 MG TOTAL) BY MOUTH DAILY AT 6 PM.  . atorvastatin (LIPITOR) 20 MG tablet TAKE 1 TABLET BY MOUTH  DAILY  . Calcium Carbonate-Vitamin D 600-400 MG-UNIT tablet Take 1 tablet by mouth 2 (two) times daily.  . Calcium Carbonate-Vitamin D 600-400 MG-UNIT tablet TAKE 1 TABLET BY MOUTH TWICE A DAY  . COMBIPATCH 0.05-0.14 MG/DAY   . diazepam (VALIUM) 5 MG tablet TAKE 1 TABLET BY MOUTH EVERY DAY AS NEEDED FOR ANXIETY OR VERTIGO  . diclofenac (VOLTAREN) 75 MG EC tablet TAKE 1 TABLET BY MOUTH TWO  TIMES DAILY  . EPINEPHrine 0.3 mg/0.3 mL IJ SOAJ injection Inject 0.3 mLs (0.3 mg total) into the muscle once.  . gabapentin (NEURONTIN) 800 MG tablet Take 1 tablet (800 mg total) by mouth 3  (three) times daily.  Marland Kitchen LINZESS 145 MCG CAPS capsule TAKE ONE CAPSULE (145 MCG TOTAL) BY MOUTH DAILY.  Marland Kitchen lisinopril-hydrochlorothiazide (PRINZIDE,ZESTORETIC) 20-25 MG tablet TAKE ONE-HALF (1/2) TO ONE TABLET DAILY  . meloxicam (MOBIC) 15 MG tablet TAKE 1 TABLET DAILY EVERY MORNING WITH BREAKFAST X2WEEKS THEN 1 TABLET DAILY AS NEEDED FOR PAIN  . metoprolol succinate (TOPROL-XL) 50 MG 24 hr tablet TAKE 1 TABLET DAILY WITH OR IMMEDIATELY FOLLOWING A  MEAL  . potassium chloride SA (K-DUR,KLOR-CON) 20 MEQ tablet TAKE 1 TABLET BY MOUTH  DAILY. OR ONLY WHEN TAKING  DEMADEX OR FUROSEMIDE  . scopolamine (TRANSDERM-SCOP, 1.5 MG,) 1 MG/3DAYS Place 1 patch (1.5 mg total) onto the skin every 3 (three) days.  . traMADol (ULTRAM) 50 MG tablet TAKE 1 TO 2 TABLETS BY  MOUTH EVERY 8 HOURS FOR  JOINT PAIN, MAXIMUM 6  TABLETS PER DAY  . traZODone (DESYREL) 150 MG tablet TAKE 1 TABLET AT BEDTIME  . traZODone (DESYREL) 150 MG tablet TAKE 1 TABLET BY MOUTH AT  BEDTIME  . zolpidem (AMBIEN) 10 MG tablet TAKE 1 TABLET BY MOUTH AT  BEDTIME   No facility-administered encounter medications on file as of 07/15/2018.      History: Past Medical History:  Diagnosis Date  . Asthma   . Fibromyalgia    [name]  . Hyperlipidemia   .  Hypertension   . Postmenopausal bleeding    Past Surgical History:  Procedure Laterality Date  . BREAST BIOPSY  1980   neg  . TOTAL KNEE ARTHROPLASTY  2000   Right, Dr. Tonita Cong    Family History  Problem Relation Age of Onset  . Diabetes Mother   . Heart attack Mother        First MI at age 63  . Diabetes type II Mother   . Coronary artery disease Mother   . Hypertension Father   . Diabetes Father   . Alcohol abuse Father   . Pancreatitis Father   . Asthma Daughter   . Asthma Grandchild    Social History   Occupational History  . 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  Tobacco Use  . Smoking status: Never  Smoker  . Smokeless tobacco: Never Used  Substance and Sexual Activity  . Alcohol use: Yes    Comment: Occasional-Last had a drink a month ago  . Drug use: No  . Sexual activity: Never    Partners: Male    Tobacco Counseling Counseling given: Not Answered   Immunizations and Health Maintenance Immunization History  Administered Date(s) Administered  . Influenza Split 09/27/2011, 09/23/2012  . Influenza Whole 10/11/2010  . Influenza,inj,Quad PF,6+ Mos 09/24/2013, 10/20/2014, 09/21/2015, 09/05/2016, 09/26/2017  . Pneumococcal Conjugate-13 03/26/2017, 07/15/2018  . Pneumococcal Polysaccharide-23 03/04/2012  . Tdap 03/04/2012  . Zoster 08/31/2015   Health Maintenance Due  Topic Date Due  . PNA vac Low Risk Adult (2 of 2 - PPSV23) 06/16/2018    Activities of Daily Living No flowsheet data found.  Physical Exam is optional for Medicare wellness exam, we did examine her knee.  Left Knee: Well-healed arthroplasty scar, tenderness over the condyles and joint lines. No warmth, erythema ROM normal in flexion and extension and lower leg rotation. Ligaments with solid consistent endpoints including ACL, PCL, LCL, MCL. Negative Mcmurray's and provocative meniscal tests. Non painful patellar compression. Patellar and quadriceps tendons unremarkable. Hamstring and quadriceps strength is normal.  Procedure: Real-time Ultrasound Guided Injection of left knee Device: GE Logiq E  Verbal informed consent obtained.  Time-out conducted.  Noted no overlying erythema, induration, or other signs of local infection.  Skin prepped in a sterile fashion.  Local anesthesia: Topical Ethyl chloride.  With sterile technique and under real time ultrasound guidance: 1 cc kenalog 40, 2 cc lidocaine, 2 cc bupivacaine injected easily Completed without difficulty  Pain immediately resolved suggesting accurate placement of the medication.  Advised to call if fevers/chills, erythema, induration,  drainage, or persistent bleeding.  Images permanently stored and available for review in the ultrasound unit.  Impression: Technically successful ultrasound guided injection.  Twelve-lead ECG personally reviewed, normal rhythm, mild left axis deviation, no PR, ST changes, early repolarization and lead I.       Assessment:    This is a routine wellness examination for this patient .   Vision/Hearing screen  Visual Acuity Screening   Right eye Left eye Both eyes  Without correction:     With correction: 20/13 20/30 20/20        Goals    None     Depression Screen PHQ 2/9 Scores 07/15/2018 09/26/2017 03/26/2017  PHQ - 2 Score 1 0 0  PHQ- 9 Score 1 - -     Fall Risk Fall Risk  07/15/2018  Falls in the past year? No     Cognitive  Function: Unremarkable        Patient Care Team: Silverio Decamp, MD as PCP - General (Family Medicine)     Plan:   Welcome to Medicare exam as above, healthy female, knee pain on the left.  I have personally reviewed and noted the following in the patient's chart:   . Medical and social history . Use of alcohol, tobacco or illicit drugs  . Current medications and supplements . Functional ability and status . Nutritional status . Physical activity . Advanced directives . List of other physicians . Hospitalizations, surgeries, and ER visits in previous 12 months . Vitals . Screenings to include cognitive, depression, and falls . Referrals and appointments  In addition, I have reviewed and discussed with patient certain preventive protocols, quality metrics, and best practice recommendations. A written personalized care plan for preventive services as well as general preventive health recommendations were provided to patient.  Annual physical exam Initial welcome to Medicare exam today, ECG. Routine labs ordered. She did have pneumococcal 80 and 23 before age 77, now that she is 55 she will be given the 62 and 23 again starting  with 13 today.    Hyperlipidemia with target LDL less than 100 Checking routine lipids.  Prediabetes Rechecking hemoglobin A1c  Left knee revision arthroplasty Persistent pain after revision arthroplasty in the left knee. I am going to give her an injection today, she likely has some synovitis.  ___________________________________________ [name]. [name], M.D., ABFM., CAQSM. Primary Care and [place] Instructor of [place] of [facility] of Medicine

## 2018-07-15 NOTE — Assessment & Plan Note (Signed)
Rechecking hemoglobin A1c 

## 2018-07-15 NOTE — Assessment & Plan Note (Signed)
Persistent pain after revision arthroplasty in the left knee. I am going to give her an injection today, she likely has some synovitis.

## 2018-07-15 NOTE — Assessment & Plan Note (Signed)
Checking routine lipids.

## 2018-07-15 NOTE — Assessment & Plan Note (Signed)
Initial welcome to Medicare exam today, ECG. Routine labs ordered. She did have pneumococcal 37 and 23 before age 67, now that she is 19 she will be given the 41 and 23 again starting with 13 today.

## 2018-07-16 LAB — COMPREHENSIVE METABOLIC PANEL WITH GFR
AG Ratio: 2 (calc) (ref 1.0–2.5)
AST: 20 U/L (ref 10–35)
Albumin: 4.8 g/dL (ref 3.6–5.1)
Alkaline phosphatase (APISO): 59 U/L (ref 33–130)
BUN/Creatinine Ratio: 23 (calc) — ABNORMAL HIGH (ref 6–22)
CO2: 29 mmol/L (ref 20–32)
Chloride: 103 mmol/L (ref 98–110)
Creat: 1.13 mg/dL — ABNORMAL HIGH (ref 0.50–0.99)
Potassium: 3.7 mmol/L (ref 3.5–5.3)

## 2018-07-16 LAB — COMPREHENSIVE METABOLIC PANEL
ALT: 16 U/L (ref 6–29)
BUN: 26 mg/dL — ABNORMAL HIGH (ref 7–25)
Calcium: 10 mg/dL (ref 8.6–10.4)
Globulin: 2.4 g/dL (calc) (ref 1.9–3.7)
Glucose, Bld: 106 mg/dL — ABNORMAL HIGH (ref 65–99)
Sodium: 140 mmol/L (ref 135–146)
Total Bilirubin: 0.8 mg/dL (ref 0.2–1.2)
Total Protein: 7.2 g/dL (ref 6.1–8.1)

## 2018-07-16 LAB — CBC
HCT: 40.4 % (ref 35.0–45.0)
Hemoglobin: 13.5 g/dL (ref 11.7–15.5)
MCH: 31.9 pg (ref 27.0–33.0)
MCHC: 33.4 g/dL (ref 32.0–36.0)
MCV: 95.5 fL (ref 80.0–100.0)
MPV: 8.9 fL (ref 7.5–12.5)
Platelets: 311 Thousand/uL (ref 140–400)
RBC: 4.23 Million/uL (ref 3.80–5.10)
RDW: 11.8 % (ref 11.0–15.0)
WBC: 7.4 10*3/uL (ref 3.8–10.8)

## 2018-07-16 LAB — HEMOGLOBIN A1C
Hgb A1c MFr Bld: 5.5 % of total Hgb (ref <5.7)
Mean Plasma Glucose: 111 (calc)
eAG (mmol/L): 6.2 (calc)

## 2018-07-16 LAB — TSH: TSH: 1.83 m[IU]/L (ref 0.40–4.50)

## 2018-07-16 LAB — LDL CHOLESTEROL, DIRECT: Direct LDL: 91 mg/dL (ref <100)

## 2018-07-18 LAB — HM MAMMOGRAPHY

## 2018-07-22 NOTE — Addendum Note (Signed)
Addended by: Clemetine Marker A on: 07/22/2018 04:20 PM   Modules accepted: Orders

## 2018-07-23 ENCOUNTER — Encounter: Payer: Self-pay | Admitting: Sports Medicine

## 2018-07-23 ENCOUNTER — Other Ambulatory Visit: Payer: Self-pay | Admitting: Sports Medicine

## 2018-07-23 DIAGNOSIS — M79605 Pain in left leg: Secondary | ICD-10-CM

## 2018-07-23 DIAGNOSIS — M545 Low back pain, unspecified: Secondary | ICD-10-CM

## 2018-08-01 ENCOUNTER — Other Ambulatory Visit: Payer: Self-pay | Admitting: Sports Medicine

## 2018-08-01 DIAGNOSIS — G4700 Insomnia, unspecified: Secondary | ICD-10-CM

## 2018-08-13 ENCOUNTER — Other Ambulatory Visit: Payer: Self-pay | Admitting: Sports Medicine

## 2018-08-31 ENCOUNTER — Other Ambulatory Visit: Payer: Self-pay | Admitting: Sports Medicine

## 2018-08-31 DIAGNOSIS — G4700 Insomnia, unspecified: Secondary | ICD-10-CM

## 2018-09-04 ENCOUNTER — Telehealth: Payer: Self-pay | Admitting: Sports Medicine

## 2018-09-04 ENCOUNTER — Encounter: Payer: Self-pay | Admitting: Sports Medicine

## 2018-09-04 NOTE — Telephone Encounter (Signed)
I started the PA for Zolpidem and I got a stop message that the patient will have to fail Belsomra before she can take Zolpidem even with a PA. Please advise.

## 2018-09-04 NOTE — Telephone Encounter (Signed)
This should just be a recertification, she has been on this medication for a while now.

## 2018-09-05 NOTE — Telephone Encounter (Signed)
Received a fax from [facility] that Zolpidem was appeoved through 12/30/2018. Form sent to scan.-hsm.

## 2018-09-05 NOTE — Telephone Encounter (Signed)
Re-sent back to insurance for authorization per PCP request.

## 2018-09-09 ENCOUNTER — Ambulatory Visit: Payer: Medicare Other | Admitting: Sports Medicine

## 2018-09-09 DIAGNOSIS — M160 Bilateral primary osteoarthritis of hip: Secondary | ICD-10-CM

## 2018-09-09 DIAGNOSIS — Z23 Encounter for immunization: Secondary | ICD-10-CM | POA: Diagnosis not present

## 2018-09-09 NOTE — Assessment & Plan Note (Signed)
Hip joint injection was last done in February, 76-month response.   Repeat right hip joint injection today. Return as needed, she has discussed this with Dr. Berenice Primas and Gaspar Skeeters, PA-C

## 2018-09-09 NOTE — Progress Notes (Signed)
Subjective:    CC: Right hip pain  HPI: This is a pleasant 67 year old female, for the past couple of weeks she has had worsening pain in her right hip, anterior, worse with weightbearing with moderate gelling.  Localized without radiation.  No trauma, no mechanical symptoms, no constitutional symptoms.  Last injection was approximately 7 months ago.  I reviewed the past medical history, family history, social history, surgical history, and allergies today and no changes were needed.  Please see the problem list section below in epic for further details.  Past Medical History: Past Medical History:  Diagnosis Date  . Asthma   . Fibromyalgia    [name]  . Hyperlipidemia   . Hypertension   . Postmenopausal bleeding    Past Surgical History: Past Surgical History:  Procedure Laterality Date  . BREAST BIOPSY  1980   neg  . TOTAL KNEE ARTHROPLASTY  2000   Right, Dr. Tonita Cong   Social History: Social History   Socioeconomic History  . Marital status: Married    Spouse name: Not on file  . Number of children: 4  . Years of education: Not on file  . Highest education level: Not on file  Occupational History  . 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  Social Needs  . Financial resource strain: Not on file  . Food insecurity:    Worry: Not on file    Inability: Not on file  . Transportation needs:    Medical: Not on file    Non-medical: Not on file  Tobacco Use  . Smoking status: Never Smoker  . Smokeless tobacco: Never Used  Substance and Sexual Activity  . Alcohol use: Yes    Comment: Occasional-Last had a drink a month ago  . Drug use: No  . Sexual activity: Never    Partners: Male  Lifestyle  . Physical activity:    Days per week: Not on file    Minutes per session: Not on file  . Stress: Not on file  Relationships  . Social connections:    Talks on phone: Not on file    Gets together: Not on  file    Attends religious service: Not on file    Active member of club or organization: Not on file    Attends meetings of clubs or organizations: Not on file    Relationship status: Not on file  Other Topics Concern  . Not on file  Social History Narrative   Married to Roland.    Has grown children, daughter in [place].   Walks for exercise.   Family History: Family History  Problem Relation Age of Onset  . Diabetes Mother   . Heart attack Mother        First MI at age 65  . Diabetes type II Mother   . Coronary artery disease Mother   . Hypertension Father   . Diabetes Father   . Alcohol abuse Father   . Pancreatitis Father   . Asthma Daughter   . Asthma Grandchild    Allergies: Allergies  Allergen Reactions  . Bee Venom Anaphylaxis   Medications: See med rec.  Review of Systems: No fevers, chills, night sweats, weight loss, chest pain, or shortness of breath.   Objective:    General: Well Developed, well nourished, and in no acute distress.  Neuro: Alert and oriented x3, extra-ocular muscles intact, sensation grossly intact.  HEENT: Normocephalic, atraumatic, pupils equal  round reactive to light, neck supple, no masses, no lymphadenopathy, thyroid nonpalpable.  Skin: Warm and dry, no rashes. Cardiac: Regular rate and rhythm, no murmurs rubs or gallops, no lower extremity edema.  Respiratory: Clear to auscultation bilaterally. Not using accessory muscles, speaking in full sentences. Right hip: ROM IR: 45 degrees with pain reproduced, ER: 60 Deg, Flexion: 120 Deg, Extension: 100 Deg, Abduction: 45 Deg, Adduction: 45 Deg Strength IR: 5/5, ER: 5/5, Flexion: 5/5, Extension: 5/5, Abduction: 5/5, Adduction: 5/5 Pelvic alignment unremarkable to inspection and palpation. Standing hip rotation and gait without trendelenburg / unsteadiness. Greater trochanter without tenderness to palpation. No tenderness over piriformis. No SI joint tenderness and normal minimal SI  movement.  Procedure: Real-time Ultrasound Guided Injection of right hip joint Device: GE Logiq E  Verbal informed consent obtained.  Time-out conducted.  Noted no overlying erythema, induration, or other signs of local infection.  Skin prepped in a sterile fashion.  Local anesthesia: Topical Ethyl chloride.  With sterile technique and under real time ultrasound guidance: Using a 22-gauge spinal needle advanced towards the femoral head/neck junction, contacted bone, I then injected 1 cc Kenalog 40, 2 cc lidocaine, 2 cc bupivacaine. Completed without difficulty  Pain immediately resolved suggesting accurate placement of the medication.  Advised to call if fevers/chills, erythema, induration, drainage, or persistent bleeding.  Images permanently stored and available for review in the ultrasound unit.  Impression: Technically successful ultrasound guided injection.  Impression and Recommendations:    Primary osteoarthritis of both hips Hip joint injection was last done in February, 29-month response.   Repeat right hip joint injection today. Return as needed, she has discussed this with Dr. Berenice Primas and Gaspar Skeeters, PA-C  ___________________________________________ [name]. [name], M.D., ABFM., CAQSM. Primary Care and [place] Instructor of [place] of [facility] of Medicine

## 2018-09-10 ENCOUNTER — Other Ambulatory Visit: Payer: Self-pay | Admitting: Sports Medicine

## 2018-09-10 DIAGNOSIS — G4700 Insomnia, unspecified: Secondary | ICD-10-CM

## 2018-09-11 MED ORDER — ZOLPIDEM TARTRATE 10 MG PO TABS: 10.0000 mg | ORAL_TABLET | Freq: Every day | ORAL | 0 refills | Status: DC

## 2018-09-14 ENCOUNTER — Other Ambulatory Visit: Payer: Self-pay | Admitting: Sports Medicine

## 2018-09-14 DIAGNOSIS — M545 Low back pain, unspecified: Secondary | ICD-10-CM

## 2018-09-14 DIAGNOSIS — M79605 Pain in left leg: Secondary | ICD-10-CM

## 2018-09-25 ENCOUNTER — Other Ambulatory Visit: Payer: Self-pay | Admitting: Sports Medicine

## 2018-10-07 ENCOUNTER — Other Ambulatory Visit: Payer: Self-pay | Admitting: Sports Medicine

## 2018-10-07 DIAGNOSIS — M545 Low back pain, unspecified: Secondary | ICD-10-CM

## 2018-10-07 DIAGNOSIS — M79605 Pain in left leg: Secondary | ICD-10-CM

## 2018-10-08 ENCOUNTER — Other Ambulatory Visit: Payer: Self-pay | Admitting: Sports Medicine

## 2018-10-16 ENCOUNTER — Other Ambulatory Visit: Payer: Self-pay | Admitting: Sports Medicine

## 2018-10-16 DIAGNOSIS — Z96652 Presence of left artificial knee joint: Secondary | ICD-10-CM

## 2018-10-16 DIAGNOSIS — T8484XS Pain due to internal orthopedic prosthetic devices, implants and grafts, sequela: Secondary | ICD-10-CM

## 2018-10-29 ENCOUNTER — Encounter: Payer: Self-pay | Admitting: Sports Medicine

## 2018-10-29 DIAGNOSIS — N841 Polyp of cervix uteri: Secondary | ICD-10-CM | POA: Insufficient documentation

## 2018-10-29 NOTE — Assessment & Plan Note (Signed)
Suspected cervical polyp per patient report, referral back to OB/GYN

## 2018-11-24 ENCOUNTER — Other Ambulatory Visit: Payer: Self-pay | Admitting: Sports Medicine

## 2018-11-24 ENCOUNTER — Encounter: Payer: Self-pay | Admitting: Sports Medicine

## 2018-11-24 DIAGNOSIS — G4700 Insomnia, unspecified: Secondary | ICD-10-CM

## 2018-11-24 DIAGNOSIS — M26623 Arthralgia of bilateral temporomandibular joint: Secondary | ICD-10-CM

## 2018-12-19 ENCOUNTER — Other Ambulatory Visit: Payer: Self-pay | Admitting: Sports Medicine

## 2018-12-30 ENCOUNTER — Other Ambulatory Visit: Payer: Self-pay | Admitting: Sports Medicine

## 2018-12-30 DIAGNOSIS — M545 Low back pain, unspecified: Secondary | ICD-10-CM

## 2018-12-30 DIAGNOSIS — M79605 Pain in left leg: Secondary | ICD-10-CM

## 2019-01-04 ENCOUNTER — Other Ambulatory Visit: Payer: Self-pay | Admitting: Sports Medicine

## 2019-01-06 ENCOUNTER — Other Ambulatory Visit: Payer: Self-pay | Admitting: Sports Medicine

## 2019-01-06 DIAGNOSIS — M26623 Arthralgia of bilateral temporomandibular joint: Secondary | ICD-10-CM

## 2019-01-06 DIAGNOSIS — G4700 Insomnia, unspecified: Secondary | ICD-10-CM

## 2019-01-06 DIAGNOSIS — M79605 Pain in left leg: Secondary | ICD-10-CM

## 2019-01-06 DIAGNOSIS — M545 Low back pain: Secondary | ICD-10-CM

## 2019-01-06 MED ORDER — TRAMADOL HCL 50 MG PO TABS: ORAL_TABLET | ORAL | 0 refills | Status: DC

## 2019-01-06 MED ORDER — DIAZEPAM 5 MG PO TABS: 5.0000 mg | ORAL_TABLET | Freq: Every day | ORAL | 0 refills | Status: DC | PRN

## 2019-01-06 MED ORDER — ZOLPIDEM TARTRATE 10 MG PO TABS: 10.0000 mg | ORAL_TABLET | Freq: Every day | ORAL | 0 refills | Status: DC

## 2019-01-07 ENCOUNTER — Encounter: Payer: Self-pay | Admitting: Sports Medicine

## 2019-01-12 NOTE — Telephone Encounter (Signed)
Received a call from [facility] wanting to verify that you did mean to send in Tramadol and Valium in at the same time. Apparently there is an error message or its not letting the pharmacy fill both medication at the same time due to contraindication with both medications. Please advise.

## 2019-01-12 NOTE — Telephone Encounter (Signed)
Yes I did want to do this, there is no contraindication to use tramadol and Valium.  Give them a call to override.

## 2019-01-13 NOTE — Telephone Encounter (Signed)
Called [facility] and spoke with Jeani Hawking to give an override per Dr. Darene Lamer recommendation. She will make a note and send to the pharmacy to fill.

## 2019-01-21 ENCOUNTER — Ambulatory Visit: Payer: Medicare Other | Admitting: Sports Medicine

## 2019-01-21 ENCOUNTER — Ambulatory Visit (INDEPENDENT_AMBULATORY_CARE_PROVIDER_SITE_OTHER): Payer: Medicare Other

## 2019-01-21 ENCOUNTER — Encounter: Payer: Self-pay | Admitting: Sports Medicine

## 2019-01-21 DIAGNOSIS — J449 Chronic obstructive pulmonary disease, unspecified: Secondary | ICD-10-CM

## 2019-01-21 DIAGNOSIS — R002 Palpitations: Secondary | ICD-10-CM | POA: Diagnosis not present

## 2019-01-21 DIAGNOSIS — F418 Other specified anxiety disorders: Secondary | ICD-10-CM | POA: Diagnosis not present

## 2019-01-21 DIAGNOSIS — R7989 Other specified abnormal findings of blood chemistry: Secondary | ICD-10-CM

## 2019-01-21 DIAGNOSIS — J4489 Other specified chronic obstructive pulmonary disease: Secondary | ICD-10-CM

## 2019-01-21 MED ORDER — PREDNISONE 50 MG PO TABS: 50.0000 mg | ORAL_TABLET | Freq: Every day | ORAL | 0 refills | Status: DC

## 2019-01-21 MED ORDER — HYDROCOD POLST-CPM POLST ER 10-8 MG/5ML PO SUER: 5.0000 mL | Freq: Two times a day (BID) | ORAL | 0 refills | Status: DC | PRN

## 2019-01-21 MED ORDER — MOXIFLOXACIN HCL 400 MG PO TABS: 400.0000 mg | ORAL_TABLET | Freq: Every day | ORAL | 0 refills | Status: AC

## 2019-01-21 MED ORDER — BENZONATATE 200 MG PO CAPS: 200.0000 mg | ORAL_CAPSULE | Freq: Three times a day (TID) | ORAL | 0 refills | Status: DC | PRN

## 2019-01-21 NOTE — Assessment & Plan Note (Signed)
Recently placed on Zoloft by her gynecologist. I am going to increase to 100 mg.

## 2019-01-21 NOTE — Assessment & Plan Note (Signed)
Unremarkable Myoview stress test and echocardiogram back in 2012. Historically ECGs have revealed sinus tachycardia when she is feeling palpitations, and this was thought to be more related to anxiety. As above we are increasing her Zoloft to 100 mg. I am also going to set her up with a new echocardiogram, labs, as well as a Holter monitor.

## 2019-01-21 NOTE — Assessment & Plan Note (Signed)
Currently in exacerbation. She has visited several nursing homes so we will treat this as healthcare associated. She has failed a course of azithromycin prescribed by her dentist. She also has some left maxillary sinusitis symptoms. Chest x-ray, Avelox, prednisone. Tussionex, Tessalon Perles. Continue current asthma regimen from her allergist. Return to see me in 2 weeks.

## 2019-01-21 NOTE — Progress Notes (Addendum)
Subjective:    CC: Several issues  HPI: Anxiety: Started on Zoloft 50 by [name] gynecologist, good partial response.  Agreeable to go up in dose.  No suicidal or homicidal ideation.  Palpitations: Occasional, random, she has been evaluated in the past with a Myoview stress test, echocardiogram, approximately 8 years ago, everything was negative.  No chest pain, no presyncope or syncope.  Coughing: For 3 weeks now she has had a cough, left-sided sinus pain and pressure.  She was treated by azithromycin by [name] dentist.  Unfortunately persistent symptoms.  She is using more albuterol as well than usual, does have a history of asthma.  I reviewed the past medical history, family history, social history, surgical history, and allergies today and no changes were needed.  Please see the problem list section below in epic for further details.  Past Medical History: Past Medical History:  Diagnosis Date  . Asthma   . Fibromyalgia    [name]  . Hyperlipidemia   . Hypertension   . Postmenopausal bleeding    Past Surgical History: Past Surgical History:  Procedure Laterality Date  . BREAST BIOPSY  1980   neg  . TOTAL KNEE ARTHROPLASTY  2000   Right, Dr. Tonita Cong   Social History: Social History   Socioeconomic History  . Marital status: Married    Spouse name: Not on file  . Number of children: 4  . Years of education: Not on file  . Highest education level: Not on file  Occupational History  . 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  Social Needs  . Financial resource strain: Not on file  . Food insecurity:    Worry: Not on file    Inability: Not on file  . Transportation needs:    Medical: Not on file    Non-medical: Not on file  Tobacco Use  . Smoking status: Never Smoker  . Smokeless tobacco: Never Used  Substance and Sexual Activity  . Alcohol use: Yes    Comment: Occasional-Last had a drink a month ago  .  Drug use: No  . Sexual activity: Never    Partners: Male  Lifestyle  . Physical activity:    Days per week: Not on file    Minutes per session: Not on file  . Stress: Not on file  Relationships  . Social connections:    Talks on phone: Not on file    Gets together: Not on file    Attends religious service: Not on file    Active member of club or organization: Not on file    Attends meetings of clubs or organizations: Not on file    Relationship status: Not on file  Other Topics Concern  . Not on file  Social History Narrative   Married to [place].    Has grown children, daughter in [place].   Walks for exercise.   Family History: Family History  Problem Relation Age of Onset  . Diabetes Mother   . Heart attack Mother        First MI at age 77  . Diabetes type II Mother   . Coronary artery disease Mother   . Hypertension Father   . Diabetes Father   . Alcohol abuse Father   . Pancreatitis Father   . Asthma Daughter   . Asthma Grandchild    Allergies: Allergies  Allergen Reactions  . Bee Venom Anaphylaxis   Medications: See med rec.  Review of Systems: No fevers, chills, night sweats, weight loss, chest pain, or shortness of breath.   Objective:    General: Well Developed, well nourished, and in no acute distress.  Neuro: Alert and oriented x3, extra-ocular muscles intact, sensation grossly intact.  HEENT: Normocephalic, atraumatic, pupils equal round reactive to light, neck supple, no masses, no lymphadenopathy, thyroid nonpalpable.  Skin: Warm and dry, no rashes. Cardiac: Regular rate and rhythm, no murmurs rubs or gallops, no lower extremity edema.  Respiratory: No inspiratory or expiratory wheezes, overall poor air movement though. Not using accessory muscles, speaking in full sentences.  Impression and Recommendations:    Asthma with COPD Currently in exacerbation. She has visited several nursing homes so we will treat this as healthcare associated. She has  failed a course of azithromycin prescribed by [name] dentist. She also has some left maxillary sinusitis symptoms. Chest x-ray, Avelox, prednisone. Tussionex, Tessalon Perles. Continue current asthma regimen from [name] allergist. Return to see me in 2 weeks.  Depression with anxiety Recently placed on Zoloft by [name] gynecologist. I am going to increase to 100 mg.  Palpitations Unremarkable Myoview stress test and echocardiogram back in 2012. Historically ECGs have revealed sinus tachycardia when she is feeling palpitations, and this was thought to be more related to anxiety. As above we are increasing [name] Zoloft to 100 mg. I am also going to set [name] up with a new echocardiogram, labs, as well as a Holter monitor.  Elevated d-dimer Stat CT angiogram needed of the pulmonary arteries.  Please contact patient ASAP and send [name] to [place].  I spent 40 minutes with this patient, greater than 50% was face-to-face time counseling regarding the above diagnoses, specifically discussed the multiple diagnoses and treatment options for palpitations ___________________________________________ Sara [name]. [name], M.D., ABFM., CAQSM. Primary Care and Sports Medicine [facility]  Adjunct Professor of [place] [employer] [facility] of Medicine

## 2019-01-22 ENCOUNTER — Ambulatory Visit (HOSPITAL_BASED_OUTPATIENT_CLINIC_OR_DEPARTMENT_OTHER)
Admission: RE | Admit: 2019-01-22 | Discharge: 2019-01-22 | Disposition: A | Discharge status: Home / Self Care | Payer: Medicare Other | Source: Ambulatory Visit | Attending: Sports Medicine | Admitting: Sports Medicine

## 2019-01-22 ENCOUNTER — Encounter (HOSPITAL_BASED_OUTPATIENT_CLINIC_OR_DEPARTMENT_OTHER): Payer: Self-pay

## 2019-01-22 DIAGNOSIS — R7989 Other specified abnormal findings of blood chemistry: Secondary | ICD-10-CM | POA: Insufficient documentation

## 2019-01-22 LAB — CBC
HCT: 41.9 % (ref 35.0–45.0)
Hemoglobin: 14.6 g/dL (ref 11.7–15.5)
MCH: 32.4 pg (ref 27.0–33.0)
MCHC: 34.8 g/dL (ref 32.0–36.0)
MCV: 93.1 fL (ref 80.0–100.0)
MPV: 9.6 fL (ref 7.5–12.5)
Platelets: 392 10*3/uL (ref 140–400)
RBC: 4.5 10*6/uL (ref 3.80–5.10)
RDW: 11.9 % (ref 11.0–15.0)
WBC: 9.4 Thousand/uL (ref 3.8–10.8)

## 2019-01-22 LAB — COMPREHENSIVE METABOLIC PANEL
AG Ratio: 1.6 (calc) (ref 1.0–2.5)
ALT: 14 U/L (ref 6–29)
AST: 18 U/L (ref 10–35)
Albumin: 4.5 g/dL (ref 3.6–5.1)
Alkaline phosphatase (APISO): 69 U/L (ref 33–130)
BUN/Creatinine Ratio: 14 (calc) (ref 6–22)
BUN: 18 mg/dL (ref 7–25)
Calcium: 9.8 mg/dL (ref 8.6–10.4)
Chloride: 103 mmol/L (ref 98–110)
Creat: 1.27 mg/dL — ABNORMAL HIGH (ref 0.50–0.99)
Globulin: 2.9 g/dL (calc) (ref 1.9–3.7)
Glucose, Bld: 109 mg/dL — ABNORMAL HIGH (ref 65–99)
Potassium: 3.6 mmol/L (ref 3.5–5.3)
Sodium: 141 mmol/L (ref 135–146)

## 2019-01-22 LAB — TSH: TSH: 0.98 mIU/L (ref 0.40–4.50)

## 2019-01-22 LAB — COMPREHENSIVE METABOLIC PANEL WITH GFR
CO2: 27 mmol/L (ref 20–32)
Total Bilirubin: 0.6 mg/dL (ref 0.2–1.2)
Total Protein: 7.4 g/dL (ref 6.1–8.1)

## 2019-01-22 LAB — D-DIMER, QUANTITATIVE: D-Dimer, Quant: 2.96 mcg/mL FEU — ABNORMAL HIGH (ref <0.50)

## 2019-01-22 MED ORDER — IOPAMIDOL (ISOVUE-370) INJECTION 76%
80.0000 mL | Freq: Once | INTRAVENOUS | Status: AC | PRN
  Administered 2019-01-22: 80 mL via INTRAVENOUS

## 2019-01-22 NOTE — Addendum Note (Signed)
Addended by: Silverio Decamp on: 01/22/2019 12:26 PM   Modules accepted: Orders

## 2019-01-22 NOTE — Assessment & Plan Note (Signed)
Stat CT angiogram needed of the pulmonary arteries.  Please contact patient ASAP and send her to [place].

## 2019-01-25 ENCOUNTER — Other Ambulatory Visit: Payer: Self-pay | Admitting: Sports Medicine

## 2019-01-25 DIAGNOSIS — M545 Low back pain, unspecified: Secondary | ICD-10-CM

## 2019-01-25 DIAGNOSIS — M79605 Pain in left leg: Secondary | ICD-10-CM

## 2019-01-26 ENCOUNTER — Ambulatory Visit (INDEPENDENT_AMBULATORY_CARE_PROVIDER_SITE_OTHER): Payer: Medicare Other

## 2019-01-26 DIAGNOSIS — R002 Palpitations: Secondary | ICD-10-CM | POA: Diagnosis not present

## 2019-01-29 ENCOUNTER — Ambulatory Visit (HOSPITAL_BASED_OUTPATIENT_CLINIC_OR_DEPARTMENT_OTHER)
Admission: RE | Admit: 2019-01-29 | Discharge: 2019-01-29 | Disposition: A | Discharge status: Home / Self Care | Payer: Medicare Other | Source: Ambulatory Visit | Attending: Sports Medicine | Admitting: Sports Medicine

## 2019-01-29 DIAGNOSIS — E119 Type 2 diabetes mellitus without complications: Secondary | ICD-10-CM | POA: Insufficient documentation

## 2019-01-29 DIAGNOSIS — R002 Palpitations: Secondary | ICD-10-CM | POA: Diagnosis not present

## 2019-01-29 DIAGNOSIS — E785 Hyperlipidemia, unspecified: Secondary | ICD-10-CM | POA: Insufficient documentation

## 2019-01-29 DIAGNOSIS — I10 Essential (primary) hypertension: Secondary | ICD-10-CM | POA: Diagnosis not present

## 2019-01-29 NOTE — Progress Notes (Signed)
*   Echocardiogram 2D Echocardiogram has been performed.  Sara [name] T Bernyce Brimley 01/29/2019, 2:34 PM

## 2019-02-04 ENCOUNTER — Encounter: Payer: Self-pay | Admitting: Sports Medicine

## 2019-02-04 ENCOUNTER — Ambulatory Visit: Payer: Medicare Other | Admitting: Sports Medicine

## 2019-02-04 DIAGNOSIS — J4489 Other specified chronic obstructive pulmonary disease: Secondary | ICD-10-CM

## 2019-02-04 DIAGNOSIS — F418 Other specified anxiety disorders: Secondary | ICD-10-CM

## 2019-02-04 DIAGNOSIS — H8113 Benign paroxysmal vertigo, bilateral: Secondary | ICD-10-CM

## 2019-02-04 DIAGNOSIS — J449 Chronic obstructive pulmonary disease, unspecified: Secondary | ICD-10-CM | POA: Diagnosis not present

## 2019-02-04 NOTE — Assessment & Plan Note (Signed)
Does extremely well with an occasional Valium.  Happy to refill this when she needs.

## 2019-02-04 NOTE — Assessment & Plan Note (Signed)
Doing better on Zoloft 100.

## 2019-02-04 NOTE — Progress Notes (Signed)
Subjective:    CC: Follow-up  HPI: This is a pleasant 68 year old female, she is doing well, we treated her for an asthma exacerbation at the last visit.  Anxiety is well controlled with the increase on Zoloft.  Palpitations are minimal, she just turned in her Holter monitor.  I reviewed the past medical history, family history, social history, surgical history, and allergies today and no changes were needed.  Please see the problem list section below in epic for further details.  Past Medical History: Past Medical History:  Diagnosis Date  . Asthma   . Fibromyalgia    [name]  . Hyperlipidemia   . Hypertension   . Postmenopausal bleeding    Past Surgical History: Past Surgical History:  Procedure Laterality Date  . BREAST BIOPSY  1980   neg  . TOTAL KNEE ARTHROPLASTY  2000   Right, Dr. Tonita Cong   Social History: Social History   Socioeconomic History  . Marital status: Married    Spouse name: Not on file  . Number of children: 4  . Years of education: Not on file  . Highest education level: Not on file  Occupational History  . 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  Social Needs  . Financial resource strain: Not on file  . Food insecurity:    Worry: Not on file    Inability: Not on file  . Transportation needs:    Medical: Not on file    Non-medical: Not on file  Tobacco Use  . Smoking status: Never Smoker  . Smokeless tobacco: Never Used  Substance and Sexual Activity  . Alcohol use: Yes    Comment: Occasional-Last had a drink a month ago  . Drug use: No  . Sexual activity: Never    Partners: Male  Lifestyle  . Physical activity:    Days per week: Not on file    Minutes per session: Not on file  . Stress: Not on file  Relationships  . Social connections:    Talks on phone: Not on file    Gets together: Not on file    Attends religious service: Not on file    Active member of club  or organization: Not on file    Attends meetings of clubs or organizations: Not on file    Relationship status: Not on file  Other Topics Concern  . Not on file  Social History Narrative   Married to [place].    Has grown children, daughter in [place].   Walks for exercise.   Family History: Family History  Problem Relation Age of Onset  . Diabetes Mother   . Heart attack Mother        First MI at age 34  . Diabetes type II Mother   . Coronary artery disease Mother   . Hypertension Father   . Diabetes Father   . Alcohol abuse Father   . Pancreatitis Father   . Asthma Daughter   . Asthma Grandchild    Allergies: Allergies  Allergen Reactions  . Bee Venom Anaphylaxis   Medications: See med rec.  Review of Systems: No fevers, chills, night sweats, weight loss, chest pain, or shortness of breath.   Objective:    General: Well Developed, well nourished, and in no acute distress.  Neuro: Alert and oriented x3, extra-ocular muscles intact, sensation grossly intact.  HEENT: Normocephalic, atraumatic, pupils equal round reactive to light, neck supple, no masses, no  lymphadenopathy, thyroid nonpalpable.  Skin: Warm and dry, no rashes. Cardiac: Regular rate and rhythm, no murmurs rubs or gallops, no lower extremity edema.  Respiratory: Clear to auscultation bilaterally. Not using accessory muscles, speaking in full sentences.  Impression and Recommendations:    Depression with anxiety Doing better on Zoloft 100.  Benign paroxysmal positional vertigo Does extremely well with an occasional Valium.  Happy to refill this when she needs.  Asthma with COPD Exacerbation has resolved with prednisone, Avelox. Return as needed. I did advise her that when spring starts to come around she should probably do an antihistamine daily. We would probably add Singulair at that time as well. ___________________________________________ [name]. [name], M.D., ABFM., CAQSM. Primary Care  and Sports Medicine [facility]  Adjunct Professor of [place] [employer] [facility] of Medicine

## 2019-02-04 NOTE — Assessment & Plan Note (Signed)
Exacerbation has resolved with prednisone, Avelox. Return as needed. I did advise her that when spring starts to come around she should probably do an antihistamine daily. We would probably add Singulair at that time as well.

## 2019-02-12 ENCOUNTER — Other Ambulatory Visit: Payer: Self-pay | Admitting: Sports Medicine

## 2019-02-24 ENCOUNTER — Encounter: Payer: Self-pay | Admitting: Sports Medicine

## 2019-02-25 ENCOUNTER — Other Ambulatory Visit: Payer: Self-pay | Admitting: Sports Medicine

## 2019-02-25 DIAGNOSIS — G4700 Insomnia, unspecified: Secondary | ICD-10-CM

## 2019-02-25 NOTE — Telephone Encounter (Signed)
I have called the patient and let her know that the Ambien was approved. No MyChart message needed. Ambien has been approved 12/31/2019.

## 2019-02-25 NOTE — Telephone Encounter (Signed)
Patient is low on her Ambien and is going out of town for a few days and is requesting a short supply of Ambien to [facility] [place]. Please advise.

## 2019-02-26 ENCOUNTER — Other Ambulatory Visit: Payer: Self-pay

## 2019-02-26 DIAGNOSIS — G4700 Insomnia, unspecified: Secondary | ICD-10-CM

## 2019-02-26 MED ORDER — ZOLPIDEM TARTRATE 10 MG PO TABS: 10.0000 mg | ORAL_TABLET | Freq: Every day | ORAL | 0 refills | Status: DC

## 2019-02-26 NOTE — Telephone Encounter (Signed)
Sara [name] needs a 30 day supply sent to [name] until the PA has been approved.

## 2019-03-01 ENCOUNTER — Other Ambulatory Visit: Payer: Self-pay | Admitting: Sports Medicine

## 2019-03-09 ENCOUNTER — Other Ambulatory Visit: Payer: Self-pay | Admitting: Sports Medicine

## 2019-03-14 ENCOUNTER — Other Ambulatory Visit: Payer: Self-pay | Admitting: Sports Medicine

## 2019-03-25 ENCOUNTER — Encounter: Payer: Self-pay | Admitting: Sports Medicine

## 2019-04-03 ENCOUNTER — Other Ambulatory Visit: Payer: Self-pay | Admitting: Sports Medicine

## 2019-04-10 ENCOUNTER — Other Ambulatory Visit: Payer: Self-pay | Admitting: Sports Medicine

## 2019-04-10 DIAGNOSIS — G4700 Insomnia, unspecified: Secondary | ICD-10-CM

## 2019-04-10 DIAGNOSIS — M26623 Arthralgia of bilateral temporomandibular joint: Secondary | ICD-10-CM

## 2019-05-06 ENCOUNTER — Encounter (INDEPENDENT_AMBULATORY_CARE_PROVIDER_SITE_OTHER): Payer: Medicare Other | Admitting: Sports Medicine

## 2019-05-06 DIAGNOSIS — L989 Disorder of the skin and subcutaneous tissue, unspecified: Secondary | ICD-10-CM | POA: Diagnosis not present

## 2019-05-07 ENCOUNTER — Encounter: Payer: Self-pay | Admitting: Sports Medicine

## 2019-05-07 NOTE — Telephone Encounter (Signed)
I spent 5 total minutes of online digital evaluation and management services. 

## 2019-05-19 ENCOUNTER — Other Ambulatory Visit: Payer: Self-pay | Admitting: Sports Medicine

## 2019-05-25 ENCOUNTER — Other Ambulatory Visit: Payer: Self-pay | Admitting: Sports Medicine

## 2019-06-04 ENCOUNTER — Encounter: Payer: Self-pay | Admitting: Sports Medicine

## 2019-06-05 ENCOUNTER — Encounter: Payer: Self-pay | Admitting: Sports Medicine

## 2019-06-05 DIAGNOSIS — G4700 Insomnia, unspecified: Secondary | ICD-10-CM

## 2019-06-05 MED ORDER — ZOLPIDEM TARTRATE 10 MG PO TABS: 10.0000 mg | ORAL_TABLET | Freq: Every day | ORAL | 0 refills | Status: DC

## 2019-06-08 ENCOUNTER — Ambulatory Visit: Payer: Medicare Other

## 2019-06-09 ENCOUNTER — Ambulatory Visit (INDEPENDENT_AMBULATORY_CARE_PROVIDER_SITE_OTHER): Payer: Medicare Other | Admitting: Sports Medicine

## 2019-06-09 DIAGNOSIS — Z20828 Contact with and (suspected) exposure to other viral communicable diseases: Secondary | ICD-10-CM | POA: Diagnosis not present

## 2019-06-09 DIAGNOSIS — Z20822 Contact with and (suspected) exposure to covid-19: Secondary | ICD-10-CM | POA: Insufficient documentation

## 2019-06-09 NOTE — Addendum Note (Signed)
Addended by: Silverio Decamp on: 06/09/2019 04:07 PM   Modules accepted: Orders

## 2019-06-09 NOTE — Progress Notes (Signed)
Subjective:    CC: COVID swab  HPI: This is a very pleasant 68 year old female,, she thinks she was exposed to COVID-19, she has no symptoms, she would like to be swabbed.  I reviewed the past medical history, family history, social history, surgical history, and allergies today and no changes were needed.  Please see the problem list section below in epic for further details.  Past Medical History: Past Medical History:  Diagnosis Date  . Asthma   . Fibromyalgia    [name]  . Hyperlipidemia   . Hypertension   . Postmenopausal bleeding    Past Surgical History: Past Surgical History:  Procedure Laterality Date  . BREAST BIOPSY  1980   neg  . TOTAL KNEE ARTHROPLASTY  2000   Right, Dr. Tonita Cong   Social History: Social History   Socioeconomic History  . Marital status: Married    Spouse name: Not on file  . Number of children: 4  . Years of education: Not on file  . Highest education level: Not on file  Occupational History  . 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  Social Needs  . Financial resource strain: Not on file  . Food insecurity:    Worry: Not on file    Inability: Not on file  . Transportation needs:    Medical: Not on file    Non-medical: Not on file  Tobacco Use  . Smoking status: Never Smoker  . Smokeless tobacco: Never Used  Substance and Sexual Activity  . Alcohol use: Yes    Comment: Occasional-Last had a drink a month ago  . Drug use: No  . Sexual activity: Never    Partners: Male  Lifestyle  . Physical activity:    Days per week: Not on file    Minutes per session: Not on file  . Stress: Not on file  Relationships  . Social connections:    Talks on phone: Not on file    Gets together: Not on file    Attends religious service: Not on file    Active member of club or organization: Not on file    Attends meetings of clubs or organizations: Not on file    Relationship  status: Not on file  Other Topics Concern  . Not on file  Social History Narrative   Married to [place].    Has grown children, daughter in [place].   Walks for exercise.   Family History: Family History  Problem Relation Age of Onset  . Diabetes Mother   . Heart attack Mother        First MI at age 11  . Diabetes type II Mother   . Coronary artery disease Mother   . Hypertension Father   . Diabetes Father   . Alcohol abuse Father   . Pancreatitis Father   . Asthma Daughter   . Asthma Grandchild    Allergies: Allergies  Allergen Reactions  . Bee Venom Anaphylaxis   Medications: See med rec.  Review of Systems: No fevers, chills, night sweats, weight loss, chest pain, or shortness of breath.   Objective:    General: Well Developed, well nourished, and in no acute distress.  Neuro: Alert and oriented x3, extra-ocular muscles intact, sensation grossly intact.  HEENT: Normocephalic, atraumatic, pupils equal round reactive to light, neck supple, no masses, no lymphadenopathy, thyroid nonpalpable.  Skin: Warm and dry, no rashes. Cardiac: Regular rate and rhythm, no murmurs  rubs or gallops, no lower extremity edema.  Respiratory: Clear to auscultation bilaterally. Not using accessory muscles, speaking in full sentences.  After donning appropriate PPE I performed a bilateral nasopharyngeal swab.  Impression and Recommendations:    Close Exposure to Covid-19 Virus Possible exposure, asymptomatic, swab per patient request.   ___________________________________________ [name]. [name], M.D., ABFM., CAQSM. Primary Care and Sports Medicine [facility]  Adjunct Professor of [place] of [facility] of Medicine

## 2019-06-09 NOTE — Assessment & Plan Note (Signed)
Possible exposure, asymptomatic, swab per patient request.

## 2019-06-11 LAB — SARS-COV-2 RNA,(COVID-19) QUALITATIVE NAAT: SARS CoV2 RNA: NOT DETECTED

## 2019-06-12 ENCOUNTER — Ambulatory Visit: Payer: Medicare Other

## 2019-06-27 ENCOUNTER — Other Ambulatory Visit: Payer: Self-pay | Admitting: Sports Medicine

## 2019-06-27 DIAGNOSIS — G4700 Insomnia, unspecified: Secondary | ICD-10-CM

## 2019-06-27 DIAGNOSIS — M26623 Arthralgia of bilateral temporomandibular joint: Secondary | ICD-10-CM

## 2019-06-29 MED ORDER — TRAMADOL HCL 50 MG PO TABS: 50.0000 mg | ORAL_TABLET | Freq: Three times a day (TID) | ORAL | 0 refills | Status: DC | PRN

## 2019-06-29 MED ORDER — ZOLPIDEM TARTRATE 10 MG PO TABS: 10.0000 mg | ORAL_TABLET | Freq: Every day | ORAL | 0 refills | Status: DC

## 2019-06-29 NOTE — Addendum Note (Signed)
Addended by: Silverio Decamp on: 06/29/2019 09:34 AM   Modules accepted: Orders

## 2019-07-06 ENCOUNTER — Other Ambulatory Visit: Payer: Self-pay | Admitting: Sports Medicine

## 2019-07-21 ENCOUNTER — Encounter: Payer: Medicare Other | Admitting: Certified Nurse Midwife

## 2019-07-21 MED ORDER — ATORVASTATIN CALCIUM 10 MG PO TABS
10.00 | ORAL_TABLET | ORAL | Status: DC
Start: 2019-07-21 — End: 2019-07-21

## 2019-07-21 MED ORDER — FA-PYRIDOXINE-CYANOCOBALAMIN 2.5-25-2 MG PO TABS
1.00 | ORAL_TABLET | ORAL | Status: DC
Start: 2019-07-22 — End: 2019-07-21

## 2019-07-21 MED ORDER — POLYETHYLENE GLYCOL 3350 17 G PO PACK
17.00 | PACK | ORAL | Status: DC
Start: 2019-07-22 — End: 2019-07-21

## 2019-07-21 MED ORDER — HYDROCODONE-ACETAMINOPHEN 5-325 MG PO TABS
1.00 | ORAL_TABLET | ORAL | Status: DC
Start: ? — End: 2019-07-21

## 2019-07-21 MED ORDER — SERTRALINE HCL 100 MG PO TABS
100.00 | ORAL_TABLET | ORAL | Status: DC
Start: 2019-07-22 — End: 2019-07-21

## 2019-07-21 MED ORDER — GENERIC EXTERNAL MEDICATION
1.00 | Status: DC
Start: 2019-07-22 — End: 2019-07-21

## 2019-07-21 MED ORDER — GENERIC EXTERNAL MEDICATION
17.20 | Status: DC
Start: 2019-07-21 — End: 2019-07-21

## 2019-07-21 MED ORDER — GENERIC EXTERNAL MEDICATION
100.00 | Status: DC
Start: 2019-07-22 — End: 2019-07-21

## 2019-07-21 MED ORDER — GENERIC EXTERNAL MEDICATION
7.50 | Status: DC
Start: 2019-07-22 — End: 2019-07-21

## 2019-07-21 MED ORDER — TRAMADOL HCL 50 MG PO TABS
50.00 | ORAL_TABLET | ORAL | Status: DC
Start: ? — End: 2019-07-21

## 2019-07-21 MED ORDER — GENERIC EXTERNAL MEDICATION
Status: DC
Start: 2019-07-22 — End: 2019-07-21

## 2019-07-21 MED ORDER — LORAZEPAM 0.5 MG PO TABS
0.50 | ORAL_TABLET | ORAL | Status: DC
Start: ? — End: 2019-07-21

## 2019-07-21 MED ORDER — BUDESONIDE-FORMOTEROL FUMARATE 160-4.5 MCG/ACT IN AERO
2.00 | INHALATION_SPRAY | RESPIRATORY_TRACT | Status: DC
Start: 2019-07-21 — End: 2019-07-21

## 2019-07-21 MED ORDER — LORAZEPAM 1 MG PO TABS
1.00 | ORAL_TABLET | ORAL | Status: DC
Start: 2019-07-21 — End: 2019-07-21

## 2019-07-21 MED ORDER — BISACODYL 10 MG RE SUPP
10.00 | RECTAL | Status: DC
Start: ? — End: 2019-07-21

## 2019-07-21 MED ORDER — MAGNESIUM HYDROXIDE 400 MG/5ML PO SUSP
30.00 | ORAL | Status: DC
Start: ? — End: 2019-07-21

## 2019-07-21 MED ORDER — MELATONIN 3 MG PO TABS
6.00 | ORAL_TABLET | ORAL | Status: DC
Start: 2019-07-21 — End: 2019-07-21

## 2019-07-21 MED ORDER — GABAPENTIN 300 MG PO CAPS
300.00 | ORAL_CAPSULE | ORAL | Status: DC
Start: 2019-07-21 — End: 2019-07-21

## 2019-07-21 MED ORDER — GENERIC EXTERNAL MEDICATION
1.00 | Status: DC
Start: ? — End: 2019-07-21

## 2019-07-21 MED ORDER — ONDANSETRON HCL 4 MG/2ML IJ SOLN
4.00 | INTRAMUSCULAR | Status: DC
Start: ? — End: 2019-07-21

## 2019-07-21 MED ORDER — ALBUTEROL SULFATE (2.5 MG/3ML) 0.083% IN NEBU
2.50 | INHALATION_SOLUTION | RESPIRATORY_TRACT | Status: DC
Start: ? — End: 2019-07-21

## 2019-07-21 MED ORDER — ASPIRIN EC 81 MG PO TBEC
81.00 | DELAYED_RELEASE_TABLET | ORAL | Status: DC
Start: 2019-07-22 — End: 2019-07-21

## 2019-07-21 MED ORDER — ENOXAPARIN SODIUM 30 MG/0.3ML ~~LOC~~ SOLN
30.00 | SUBCUTANEOUS | Status: DC
Start: 2019-07-21 — End: 2019-07-21

## 2019-07-21 MED ORDER — PANTOPRAZOLE SODIUM 40 MG PO TBEC
40.00 | DELAYED_RELEASE_TABLET | ORAL | Status: DC
Start: 2019-07-22 — End: 2019-07-21

## 2019-07-21 MED ORDER — ACETAMINOPHEN 325 MG PO TABS
325.00 | ORAL_TABLET | ORAL | Status: DC
Start: 2019-07-21 — End: 2019-07-21

## 2019-07-21 MED ORDER — HYDRALAZINE HCL 20 MG/ML IJ SOLN
10.00 | INTRAMUSCULAR | Status: DC
Start: ? — End: 2019-07-21

## 2019-07-23 ENCOUNTER — Telehealth: Payer: Self-pay

## 2019-07-23 NOTE — Telephone Encounter (Signed)
Pt's husband called and wanted to make Dr T aware that patient is in the hospital from a fall and serious injury. Wanted to have T look at her records.  Advised husband to have pt schedule hospital follow up once she is discharged

## 2019-07-24 NOTE — Telephone Encounter (Signed)
I have been following along.

## 2019-07-27 ENCOUNTER — Telehealth: Payer: Self-pay | Admitting: Sports Medicine

## 2019-07-27 NOTE — Telephone Encounter (Signed)
I can see her when I get back.  If she feels she needs to be seen sooner than she should schedule with Dr. Georgina Snell sometime this week.

## 2019-07-27 NOTE — Telephone Encounter (Signed)
Patient was seen at [place] for [place] cord contusion and is being discharged today. Does patient need to be seen sooner?

## 2019-07-28 NOTE — Telephone Encounter (Signed)
Will advise, thank you.

## 2019-07-30 MED ORDER — BISACODYL 10 MG RE SUPP
10.00 | RECTAL | Status: DC
Start: ? — End: 2019-07-30

## 2019-07-30 MED ORDER — PANTOPRAZOLE SODIUM 40 MG PO TBEC
40.00 | DELAYED_RELEASE_TABLET | ORAL | Status: DC
Start: 2019-07-31 — End: 2019-07-30

## 2019-07-30 MED ORDER — GENERIC EXTERNAL MEDICATION
7.50 | Status: DC
Start: 2019-07-31 — End: 2019-07-30

## 2019-07-30 MED ORDER — POLYETHYLENE GLYCOL 3350 17 G PO PACK
17.00 | PACK | ORAL | Status: DC
Start: 2019-07-31 — End: 2019-07-30

## 2019-07-30 MED ORDER — BUDESONIDE-FORMOTEROL FUMARATE 160-4.5 MCG/ACT IN AERO
2.00 | INHALATION_SPRAY | RESPIRATORY_TRACT | Status: DC
Start: 2019-07-30 — End: 2019-07-30

## 2019-07-30 MED ORDER — SERTRALINE HCL 50 MG PO TABS
100.00 | ORAL_TABLET | ORAL | Status: DC
Start: 2019-07-31 — End: 2019-07-30

## 2019-07-30 MED ORDER — GABAPENTIN 300 MG PO CAPS
300.00 | ORAL_CAPSULE | ORAL | Status: DC
Start: 2019-07-30 — End: 2019-07-30

## 2019-07-30 MED ORDER — FA-PYRIDOXINE-CYANOCOBALAMIN 2.5-25-2 MG PO TABS
1.00 | ORAL_TABLET | ORAL | Status: DC
Start: 2019-07-31 — End: 2019-07-30

## 2019-07-30 MED ORDER — GENERIC EXTERNAL MEDICATION
Status: DC
Start: 2019-07-31 — End: 2019-07-30

## 2019-07-30 MED ORDER — ACETAMINOPHEN 325 MG PO TABS
650.00 | ORAL_TABLET | ORAL | Status: DC
Start: ? — End: 2019-07-30

## 2019-07-30 MED ORDER — GENERIC EXTERNAL MEDICATION
17.20 | Status: DC
Start: 2019-07-30 — End: 2019-07-30

## 2019-07-30 MED ORDER — ATORVASTATIN CALCIUM 10 MG PO TABS
10.00 | ORAL_TABLET | ORAL | Status: DC
Start: 2019-07-30 — End: 2019-07-30

## 2019-07-30 MED ORDER — ASPIRIN EC 81 MG PO TBEC
81.00 | DELAYED_RELEASE_TABLET | ORAL | Status: DC
Start: 2019-07-31 — End: 2019-07-30

## 2019-07-30 MED ORDER — MELATONIN 3 MG PO TABS
9.00 | ORAL_TABLET | ORAL | Status: DC
Start: 2019-07-30 — End: 2019-07-30

## 2019-07-30 MED ORDER — HYDROCODONE-ACETAMINOPHEN 5-325 MG PO TABS
1.00 | ORAL_TABLET | ORAL | Status: DC
Start: ? — End: 2019-07-30

## 2019-07-30 MED ORDER — LORAZEPAM 0.5 MG PO TABS
0.50 | ORAL_TABLET | ORAL | Status: DC
Start: ? — End: 2019-07-30

## 2019-07-30 MED ORDER — MAGNESIUM HYDROXIDE 400 MG/5ML PO SUSP
30.00 | ORAL | Status: DC
Start: ? — End: 2019-07-30

## 2019-07-30 MED ORDER — GENERIC EXTERNAL MEDICATION
1.00 | Status: DC
Start: 2019-07-31 — End: 2019-07-30

## 2019-07-30 MED ORDER — ALBUTEROL SULFATE (2.5 MG/3ML) 0.083% IN NEBU
2.50 | INHALATION_SOLUTION | RESPIRATORY_TRACT | Status: DC
Start: ? — End: 2019-07-30

## 2019-08-03 ENCOUNTER — Other Ambulatory Visit: Payer: Self-pay | Admitting: Sports Medicine

## 2019-08-03 DIAGNOSIS — M79605 Pain in left leg: Secondary | ICD-10-CM

## 2019-08-03 DIAGNOSIS — M545 Low back pain: Secondary | ICD-10-CM

## 2019-08-03 MED ORDER — MELOXICAM 15 MG PO TABS: 15.0000 mg | ORAL_TABLET | Freq: Every day | ORAL | 0 refills | Status: DC | PRN

## 2019-08-10 ENCOUNTER — Ambulatory Visit: Payer: Medicare Other

## 2019-08-10 ENCOUNTER — Ambulatory Visit: Payer: Medicare Other | Admitting: Sports Medicine

## 2019-08-10 ENCOUNTER — Other Ambulatory Visit: Payer: Self-pay

## 2019-08-10 VITALS — BP 102/69 | HR 89 | Ht 65.0 in | Wt 198.0 lb

## 2019-08-10 DIAGNOSIS — I10 Essential (primary) hypertension: Secondary | ICD-10-CM

## 2019-08-10 DIAGNOSIS — Z23 Encounter for immunization: Secondary | ICD-10-CM

## 2019-08-10 DIAGNOSIS — F418 Other specified anxiety disorders: Secondary | ICD-10-CM

## 2019-08-10 DIAGNOSIS — I82611 Acute embolism and thrombosis of superficial veins of right upper extremity: Secondary | ICD-10-CM

## 2019-08-10 DIAGNOSIS — R002 Palpitations: Secondary | ICD-10-CM

## 2019-08-10 DIAGNOSIS — E785 Hyperlipidemia, unspecified: Secondary | ICD-10-CM

## 2019-08-10 DIAGNOSIS — S14109A Unspecified injury at unspecified level of cervical spinal cord, initial encounter: Secondary | ICD-10-CM

## 2019-08-10 DIAGNOSIS — Z0000 Encounter for general adult medical examination without abnormal findings: Secondary | ICD-10-CM | POA: Diagnosis not present

## 2019-08-10 MED ORDER — DIAZEPAM 5 MG PO TABS: 5.0000 mg | ORAL_TABLET | Freq: Every day | ORAL | 0 refills | Status: DC | PRN

## 2019-08-10 NOTE — Assessment & Plan Note (Signed)
It is unclear as to what caused the fall but based on her history and memory it sounds as though she likely tripped.

## 2019-08-10 NOTE — Assessment & Plan Note (Signed)
Ordering ultrasound, she can use over-the-counter NSAIDs and massage.

## 2019-08-10 NOTE — Progress Notes (Signed)
Subjective:    CC: Hospital follow-up  HPI:  This is a pleasant 68 year old female, to recap she had a fall at her house, down the stairs, she was found unresponsive by EMS, CPR was initiated with return of spontaneous circulation without medications, she had multiple attempts at intubation, and was ultimately intubated in the emergency department.  She was noted to have extensive spinal cord contusion, what appeared to be vertebral fractures without compression, paraspinal edema, signs consistent of facet injury, as well as liver and splenic lacerations.  He was treated in the ICU, and and has recovered uneventfully.  She does have a cordlike palpable, tender structure over her right anterior elbow, she likely had an IV placed in this location.  She is feeling somewhat foggy and dull, maybe a bit concussed.  Otherwise doing well.  I reviewed the past medical history, family history, social history, surgical history, and allergies today and no changes were needed.  Please see the problem list section below in epic for further details.  Past Medical History: Past Medical History:  Diagnosis Date  . Asthma   . Fibromyalgia    [name]  . Hyperlipidemia   . Hypertension   . Postmenopausal bleeding    Past Surgical History: Past Surgical History:  Procedure Laterality Date  . BREAST BIOPSY  1980   neg  . TOTAL KNEE ARTHROPLASTY  2000   Right, Dr. Tonita Cong   Social History: Social History   Socioeconomic History  . Marital status: Married    Spouse name: Not on file  . Number of children: 4  . Years of education: Not on file  . Highest education level: Not on file  Occupational History  . 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  Social Needs  . Financial resource strain: Not on file  . Food insecurity    Worry: Not on file    Inability: Not on file  . Transportation needs    Medical: Not on file    Non-medical:  Not on file  Tobacco Use  . Smoking status: Never Smoker  . Smokeless tobacco: Never Used  Substance and Sexual Activity  . Alcohol use: Yes    Comment: Occasional-Last had a drink a month ago  . Drug use: No  . Sexual activity: Never    Partners: Male  Lifestyle  . Physical activity    Days per week: Not on file    Minutes per session: Not on file  . Stress: Not on file  Relationships  . Social Herbalist on phone: Not on file    Gets together: Not on file    Attends religious service: Not on file    Active member of club or organization: Not on file    Attends meetings of clubs or organizations: Not on file    Relationship status: Not on file  Other Topics Concern  . Not on file  Social History Narrative   Married to [place].    Has grown children, daughter in [place].   Walks for exercise.   Family History: Family History  Problem Relation Age of Onset  . Diabetes Mother   . Heart attack Mother        First MI at age 71  . Diabetes type II Mother   . Coronary artery disease Mother   . Hypertension Father   . Diabetes Father   . Alcohol abuse Father   .  Pancreatitis Father   . Asthma Daughter   . Asthma Grandchild    Allergies: Allergies  Allergen Reactions  . Bee Venom Anaphylaxis   Medications: See med rec.  Review of Systems: No headache, visual changes, nausea, vomiting, diarrhea, constipation, dizziness, abdominal pain, skin rash, fevers, chills, night sweats, swollen lymph nodes, weight loss, chest pain, body aches, joint swelling, muscle aches, shortness of breath, mood changes, visual or auditory hallucinations.  Objective:    General: Well Developed, well nourished, and in no acute distress.  Neuro: Alert and oriented x3, extra-ocular muscles intact, sensation grossly intact.  HEENT: Normocephalic, atraumatic, pupils equal round reactive to light, neck supple, no masses, no lymphadenopathy, thyroid nonpalpable. In cervical collar. Skin: Warm  and dry, no rashes noted.  Cardiac: Regular rate and rhythm, no murmurs rubs or gallops.  Respiratory: Clear to auscultation bilaterally. Not using accessory muscles, speaking in full sentences.  Abdominal: Soft, nontender, nondistended, positive bowel sounds, no masses, no organomegaly.  Musculoskeletal: Shoulder, elbow, wrist, hip, knee, ankle stable, and with full range of motion. Right arm: possible SVT right cephalic vein.  Impression and Recommendations:    The patient was counselled, risk factors were discussed, anticipatory guidance given.  Contusion of cervical cord [facility]) Sara [name] is a 68 y.o. female, RHD, who sustained a a fall. Brief details of the history include: patient was found unresponsive by EMS, reportedly fell down 15 steps, the patient underwent CPR for 10 minutes with return of spontaneous circulation without need for medications. The patient was nonambulatory afterwards. Repeated attempts at intubation were made and patient was intubated in ED and admitted to trauma ICU. She was extubated on 7/12.  During her fall she also sustained a liver and splenic laceration.  She has improved considerably. She has full strength and range of motion, she is still in a cervical collar which will be continued for now per her spine surgeons.   Annual physical exam Pneumococcal 23 given today.  Hypertension It is unclear as to what caused the fall but based on her history and memory it sounds as though she likely tripped.  Depression with anxiety Refilling Valium, she rarely takes this and tells me that she did not take any on the day of the fall.  Hyperlipidemia with target LDL less than 100 Continue atorvastatin 20.  Palpitations Unremarkable Myoview stress test in 2012 and echocardiogram in 2020. No chest pain, palpitations with going up 2-3 flights of stairs. It is unlikely that this was cardioischemic in nature.   Superficial venous thrombosis of arm, right  Ordering ultrasound, she can use over-the-counter NSAIDs and massage.   ___________________________________________ [name]. [name], M.D., ABFM., CAQSM. Primary Care and Sports Medicine [facility]  Adjunct Professor of [place] [employer] [facility] of Medicine

## 2019-08-10 NOTE — Assessment & Plan Note (Signed)
Sara [name] is a 68 y.o. female, RHD, who sustained a a fall. Brief details of the history include: patient was found unresponsive by EMS, reportedly fell down 15 steps, the patient underwent CPR for 10 minutes with return of spontaneous circulation without need for medications. The patient was nonambulatory afterwards. Repeated attempts at intubation were made and patient was intubated in ED and admitted to trauma ICU. She was extubated on 7/12.  During her fall she also sustained a liver and splenic laceration.  She has improved considerably. She has full strength and range of motion, she is still in a cervical collar which will be continued for now per her spine surgeons.

## 2019-08-10 NOTE — Assessment & Plan Note (Signed)
Refilling Valium, she rarely takes this and tells me that she did not take any on the day of the fall.

## 2019-08-10 NOTE — Assessment & Plan Note (Signed)
Continue atorvastatin 20.

## 2019-08-10 NOTE — Assessment & Plan Note (Signed)
Pneumococcal 23 given today.

## 2019-08-10 NOTE — Assessment & Plan Note (Signed)
Unremarkable Myoview stress test in 2012 and echocardiogram in 2020. No chest pain, palpitations with going up 2-3 flights of stairs. It is unlikely that this was cardioischemic in nature.

## 2019-08-13 ENCOUNTER — Other Ambulatory Visit: Payer: Self-pay | Admitting: *Deleted

## 2019-08-13 ENCOUNTER — Other Ambulatory Visit: Payer: Self-pay | Admitting: Sports Medicine

## 2019-08-13 MED ORDER — GABAPENTIN 400 MG PO CAPS: 400.0000 mg | ORAL_CAPSULE | Freq: Three times a day (TID) | ORAL | 3 refills | Status: DC

## 2019-08-26 ENCOUNTER — Encounter: Payer: Self-pay | Admitting: Sports Medicine

## 2019-08-30 ENCOUNTER — Other Ambulatory Visit: Payer: Self-pay | Admitting: Sports Medicine

## 2019-08-30 DIAGNOSIS — M79605 Pain in left leg: Secondary | ICD-10-CM

## 2019-08-30 DIAGNOSIS — M545 Low back pain, unspecified: Secondary | ICD-10-CM

## 2019-08-31 ENCOUNTER — Other Ambulatory Visit: Payer: Self-pay | Admitting: Sports Medicine

## 2019-09-08 ENCOUNTER — Ambulatory Visit: Payer: Medicare Other | Admitting: Sports Medicine

## 2019-09-10 ENCOUNTER — Other Ambulatory Visit: Payer: Self-pay

## 2019-09-10 ENCOUNTER — Ambulatory Visit (INDEPENDENT_AMBULATORY_CARE_PROVIDER_SITE_OTHER): Payer: Medicare Other | Admitting: Sports Medicine

## 2019-09-10 ENCOUNTER — Encounter: Payer: Self-pay | Admitting: Sports Medicine

## 2019-09-10 VITALS — BP 121/76 | HR 70 | Ht 65.0 in | Wt 201.0 lb

## 2019-09-10 DIAGNOSIS — S14109D Unspecified injury at unspecified level of cervical spinal cord, subsequent encounter: Secondary | ICD-10-CM

## 2019-09-10 DIAGNOSIS — Z0000 Encounter for general adult medical examination without abnormal findings: Secondary | ICD-10-CM | POA: Diagnosis not present

## 2019-09-10 DIAGNOSIS — Z23 Encounter for immunization: Secondary | ICD-10-CM

## 2019-09-10 DIAGNOSIS — I82611 Acute embolism and thrombosis of superficial veins of right upper extremity: Secondary | ICD-10-CM | POA: Diagnosis not present

## 2019-09-10 NOTE — Assessment & Plan Note (Signed)
Ultrasound confirmed cephalic vein superficial venous thrombosis. Improving considerably. There is slight persistence, continue NSAIDs and massage.

## 2019-09-10 NOTE — Progress Notes (Signed)
Subjective:    CC: Follow-up  HPI: We diagnosed a right cephalic superficial venous thrombosis at the last visit, things are going really well.  Mostly resolved.  I reviewed the past medical history, family history, social history, surgical history, and allergies today and no changes were needed.  Please see the problem list section below in epic for further details.  Past Medical History: Past Medical History:  Diagnosis Date  . Asthma   . Fibromyalgia    [name]  . Hyperlipidemia   . Hypertension   . Postmenopausal bleeding    Past Surgical History: Past Surgical History:  Procedure Laterality Date  . BREAST BIOPSY  1980   neg  . TOTAL KNEE ARTHROPLASTY  2000   Right, Dr. Tonita Cong   Social History: Social History   Socioeconomic History  . Marital status: Married    Spouse name: Not on file  . Number of children: 4  . Years of education: Not on file  . Highest education level: Not on file  Occupational History  . 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  Social Needs  . Financial resource strain: Not on file  . Food insecurity    Worry: Not on file    Inability: Not on file  . Transportation needs    Medical: Not on file    Non-medical: Not on file  Tobacco Use  . Smoking status: Never Smoker  . Smokeless tobacco: Never Used  Substance and Sexual Activity  . Alcohol use: Yes    Comment: Occasional-Last had a drink a month ago  . Drug use: No  . Sexual activity: Never    Partners: Male  Lifestyle  . Physical activity    Days per week: Not on file    Minutes per session: Not on file  . Stress: Not on file  Relationships  . Social Herbalist on phone: Not on file    Gets together: Not on file    Attends religious service: Not on file    Active member of club or organization: Not on file    Attends meetings of clubs or organizations: Not on file    Relationship status: Not on  file  Other Topics Concern  . Not on file  Social History Narrative   Married to [place].    Has grown children, daughter in [place].   Walks for exercise.   Family History: Family History  Problem Relation Age of Onset  . Diabetes Mother   . Heart attack Mother        First MI at age 9  . Diabetes type II Mother   . Coronary artery disease Mother   . Hypertension Father   . Diabetes Father   . Alcohol abuse Father   . Pancreatitis Father   . Asthma Daughter   . Asthma Grandchild    Allergies: Allergies  Allergen Reactions  . Bee Venom Anaphylaxis   Medications: See med rec.  Review of Systems: No fevers, chills, night sweats, weight loss, chest pain, or shortness of breath.   Objective:    General: Well Developed, well nourished, and in no acute distress.  Neuro: Alert and oriented x3, extra-ocular muscles intact, sensation grossly intact.  HEENT: Normocephalic, atraumatic, pupils equal round reactive to light, neck supple, no masses, no lymphadenopathy, thyroid nonpalpable.  Skin: Warm and dry, no rashes. Cardiac: Regular rate and rhythm, no murmurs rubs or gallops, no lower  extremity edema.  Respiratory: Clear to auscultation bilaterally. Not using accessory muscles, speaking in full sentences. Right arm: There is a tiny palpable nodule over the cephalic vein but otherwise resolved.  Impression and Recommendations:    Superficial venous thrombosis of arm, right Ultrasound confirmed cephalic vein superficial venous thrombosis. Improving considerably. There is slight persistence, continue NSAIDs and massage.  Contusion of cervical cord (HCC) Continues to improve, she has a touch of weakness in her left arm but currently undergoing physical therapy with dry needling. I would like a solid 6 weeks to 2 months of physical therapy before we consider additional intervention. I would like at least 3 months post accident before we consider any intervention near her cervical  spine.   ___________________________________________ [name]. [name], M.D., ABFM., CAQSM. Primary Care and Sports Medicine [facility]  Adjunct Professor of [place] of [facility] of Medicine

## 2019-09-10 NOTE — Assessment & Plan Note (Signed)
Continues to improve, she has a touch of weakness in her left arm but currently undergoing physical therapy with dry needling. I would like a solid 6 weeks to 2 months of physical therapy before we consider additional intervention. I would like at least 3 months post accident before we consider any intervention near her cervical spine.

## 2019-09-16 ENCOUNTER — Other Ambulatory Visit: Payer: Self-pay | Admitting: Sports Medicine

## 2019-09-16 ENCOUNTER — Ambulatory Visit (INDEPENDENT_AMBULATORY_CARE_PROVIDER_SITE_OTHER): Payer: Medicare Other

## 2019-09-16 ENCOUNTER — Other Ambulatory Visit: Payer: Self-pay

## 2019-09-16 DIAGNOSIS — E2839 Other primary ovarian failure: Secondary | ICD-10-CM | POA: Diagnosis not present

## 2019-09-16 DIAGNOSIS — Z780 Asymptomatic menopausal state: Secondary | ICD-10-CM | POA: Diagnosis not present

## 2019-09-16 DIAGNOSIS — Z0000 Encounter for general adult medical examination without abnormal findings: Secondary | ICD-10-CM

## 2019-09-16 DIAGNOSIS — Z1231 Encounter for screening mammogram for malignant neoplasm of breast: Secondary | ICD-10-CM

## 2019-09-21 ENCOUNTER — Other Ambulatory Visit: Payer: Self-pay | Admitting: Sports Medicine

## 2019-09-21 DIAGNOSIS — T8484XS Pain due to internal orthopedic prosthetic devices, implants and grafts, sequela: Secondary | ICD-10-CM

## 2019-09-21 DIAGNOSIS — Z96652 Presence of left artificial knee joint: Secondary | ICD-10-CM

## 2019-09-28 ENCOUNTER — Other Ambulatory Visit: Payer: Self-pay | Admitting: Sports Medicine

## 2019-09-28 DIAGNOSIS — G4700 Insomnia, unspecified: Secondary | ICD-10-CM

## 2019-09-28 MED ORDER — ZOLPIDEM TARTRATE 10 MG PO TABS: 10.0000 mg | ORAL_TABLET | Freq: Every day | ORAL | 0 refills | Status: DC

## 2019-10-02 ENCOUNTER — Other Ambulatory Visit (HOSPITAL_COMMUNITY)
Admission: RE | Admit: 2019-10-02 | Discharge: 2019-10-02 | Disposition: A | Discharge status: Home / Self Care | Payer: Medicare Other | Source: Ambulatory Visit | Attending: Certified Nurse Midwife | Admitting: Certified Nurse Midwife

## 2019-10-02 ENCOUNTER — Encounter: Payer: Self-pay | Admitting: Obstetrics and Gynecology

## 2019-10-02 ENCOUNTER — Ambulatory Visit (INDEPENDENT_AMBULATORY_CARE_PROVIDER_SITE_OTHER): Payer: Medicare Other | Admitting: Obstetrics and Gynecology

## 2019-10-02 ENCOUNTER — Other Ambulatory Visit: Payer: Self-pay

## 2019-10-02 VITALS — BP 132/81 | HR 72 | Resp 16 | Ht 64.0 in | Wt 204.0 lb

## 2019-10-02 DIAGNOSIS — Z780 Asymptomatic menopausal state: Secondary | ICD-10-CM | POA: Insufficient documentation

## 2019-10-02 DIAGNOSIS — Z01419 Encounter for gynecological examination (general) (routine) without abnormal findings: Secondary | ICD-10-CM | POA: Insufficient documentation

## 2019-10-02 DIAGNOSIS — Z1151 Encounter for screening for human papillomavirus (HPV): Secondary | ICD-10-CM | POA: Insufficient documentation

## 2019-10-02 MED ORDER — DICLOFENAC SODIUM 1 % TD GEL: 2.0000 g | Freq: Four times a day (QID) | TRANSDERMAL | 1 refills | Status: DC

## 2019-10-02 NOTE — Progress Notes (Signed)
GYNECOLOGY ANNUAL PREVENTATIVE CARE ENCOUNTER NOTE  History:     Sara [name] is a 68 y.o. G1P1 very pleasant  female here for a routine annual gynecologic exam.  Current complaints: None.   Denies abnormal vaginal bleeding, discharge, pelvic pain, problems with intercourse or other gynecologic concerns.   She was receiving care at [facility]. PCP is in the same office and would like to be close to PCP.    Gynecologic History No LMP recorded. Patient is postmenopausal. Contraception: Combipatch has been wearing for over 10 years.  Last Pap: 2019, she thinks it may have had some abnormal cells  Last mammogram: 2019 and it was normal   Obstetric History OB History  Gravida Para Term Preterm AB Living  1 1          SAB TAB Ectopic Multiple Live Births          1    # Outcome Date GA Lbr Len/2nd Weight Sex Delivery Anes PTL Lv  1 Para      Vag-Spont       Past Medical History:  Diagnosis Date  . Asthma   . Fibromyalgia    [name]  . Hyperlipidemia   . Hypertension   . Postmenopausal bleeding   . Vaginal Pap smear, abnormal     Past Surgical History:  Procedure Laterality Date  . BREAST BIOPSY  1980   neg  . TOTAL KNEE ARTHROPLASTY  2000   Right, Dr. Tonita Cong    Current Outpatient Medications on File Prior to Visit  Medication Sig Dispense Refill  . aspirin 81 MG tablet Take 81 mg by mouth daily.      Marland Kitchen atorvastatin (LIPITOR) 20 MG tablet TAKE 1 TABLET BY MOUTH  DAILY 90 tablet 2  . Calcium Carbonate-Vitamin D 600-400 MG-UNIT tablet TAKE 1 TABLET BY MOUTH TWICE A DAY 60 tablet 10  . COMBIPATCH 0.05-0.14 MG/DAY     . diazepam (VALIUM) 5 MG tablet Take 1 tablet (5 mg total) by mouth daily as needed for anxiety. 30 tablet 0  . diclofenac (VOLTAREN) 75 MG EC tablet TAKE 1 TABLET BY MOUTH TWO  TIMES DAILY 180 tablet 3  . EPINEPHrine 0.3 mg/0.3 mL IJ SOAJ injection Inject 0.3 mLs (0.3 mg total) into the muscle once. 1 Device 11  . gabapentin (NEURONTIN) 400 MG capsule  Take 1 capsule (400 mg total) by mouth 3 (three) times daily. 90 capsule 3  . LINZESS 145 MCG CAPS capsule TAKE ONE CAPSULE (145 MCG TOTAL) BY MOUTH DAILY.  5  . metoprolol succinate (TOPROL-XL) 50 MG 24 hr tablet TAKE 1 TABLET BY MOUTH  DAILY WITH OR IMMEDIATELY  FOLLOWING A MEAL 90 tablet 3  . potassium chloride SA (K-DUR) 20 MEQ tablet TAKE 1 TABLET BY MOUTH  DAILY (OR ONLY WHEN TAKING  DEMADEX OR FUROSEMIDE) 90 tablet 3  . sertraline (ZOLOFT) 100 MG tablet Take 1 tablet (100 mg total) by mouth daily.    . traMADol (ULTRAM) 50 MG tablet Take 1 tablet (50 mg total) by mouth 3 (three) times daily as needed. 90 tablet 0  . traZODone (DESYREL) 150 MG tablet TAKE 1 TABLET BY MOUTH AT  BEDTIME 90 tablet 3  . zolpidem (AMBIEN) 10 MG tablet Take 1 tablet (10 mg total) by mouth at bedtime. 90 tablet 0  . meloxicam (MOBIC) 15 MG tablet TAKE 1 TABLET BY MOUTH EVERY DAY AS NEEDED FOR PAIN (Patient not taking: Reported on 10/02/2019) 30 tablet 0  No current facility-administered medications on file prior to visit.     Allergies  Allergen Reactions  . Bee Venom Anaphylaxis    Social History:  reports that she has never smoked. She has never used smokeless tobacco. She reports current alcohol use. She reports that she does not use drugs.  Family History  Problem Relation Age of Onset  . Diabetes Mother   . Heart attack Mother        First MI at age 85  . Diabetes type II Mother   . Coronary artery disease Mother   . Hypertension Father   . Diabetes Father   . Alcohol abuse Father   . Pancreatitis Father   . Asthma Daughter   . Asthma Grandchild     The following portions of the patient's history were reviewed and updated as appropriate: allergies, current medications, past family history, past medical history, past social history, past surgical history and problem list.  Review of Systems Pertinent items noted in HPI and remainder of comprehensive ROS otherwise negative.  Physical Exam:   BP 132/81   Pulse 72   Resp 16   Ht 5\' 4"  (1.626 m)   Wt 204 lb (92.5 kg)   BMI 35.02 kg/m  CONSTITUTIONAL: Well-developed, well-nourished female in no acute distress.  HENT:  Normocephalic, atraumatic, External right and left ear normal. Oropharynx is clear and moist EYES: Conjunctivae and EOM are normal. Pupils are equal, round, and reactive to light. No scleral icterus.  NECK: Normal range of motion, supple, no masses.  Normal thyroid.  SKIN: Skin is warm and dry. No rash noted. Not diaphoretic. No erythema. No pallor. MUSCULOSKELETAL: Normal range of motion. No tenderness.  No cyanosis, clubbing, or edema.  2+ distal pulses. NEUROLOGIC: Alert and oriented to person, place, and time. Normal reflexes, muscle tone coordination. No cranial nerve deficit noted. PSYCHIATRIC: Normal mood and affect. Normal behavior. Normal judgment and thought content. CARDIOVASCULAR: Normal heart rate noted, regular rhythm RESPIRATORY: Clear to auscultation bilaterally. Effort and breath sounds normal, no problems with respiration noted. BREASTS: Symmetric in size. No masses, skin changes, nipple drainage, or lymphadenopathy. ABDOMEN: Soft, normal bowel sounds, no distention noted.  No tenderness, rebound or guarding.  PELVIC: Normal appearing external genitalia; normal appearing vaginal mucosa and cervix.  No abnormal discharge noted.  Pap smear obtained.  Normal uterine size, no other palpable masses, no uterine or adnexal tenderness.  Assessment and Plan:   1. Well woman exam with routine gynecological exam  - Cytology - PAP( [facility]) - Has been on Combipatch for 10 years. She started d/t hot flashes. She would like to trial without it. Ok to do that. Restart if symptoms resume.  She is in PT d/t recent fall in June. Having some muscle pain near right should since her fall. Rx: Voltaren short course. See PT and PCP    Will follow up results of pap smear and manage accordingly. Mammogram scheduled  Routine preventative health maintenance measures emphasized. Please refer to After Visit Summary for other counseling recommendations.   Martinez Boxx, Artist Pais, [place] for [employer], [place]

## 2019-10-06 ENCOUNTER — Encounter: Payer: Self-pay | Admitting: Sports Medicine

## 2019-10-06 DIAGNOSIS — S14109D Unspecified injury at unspecified level of cervical spinal cord, subsequent encounter: Secondary | ICD-10-CM

## 2019-10-06 DIAGNOSIS — F418 Other specified anxiety disorders: Secondary | ICD-10-CM

## 2019-10-07 MED ORDER — DIAZEPAM 5 MG PO TABS: 5.0000 mg | ORAL_TABLET | Freq: Every day | ORAL | 0 refills | Status: DC | PRN

## 2019-10-07 NOTE — Addendum Note (Signed)
Addended by: Silverio Decamp on: 10/07/2019 02:53 PM   Modules accepted: Orders

## 2019-10-08 ENCOUNTER — Other Ambulatory Visit: Payer: Self-pay

## 2019-10-08 ENCOUNTER — Ambulatory Visit (INDEPENDENT_AMBULATORY_CARE_PROVIDER_SITE_OTHER): Payer: Medicare Other

## 2019-10-08 DIAGNOSIS — Z1231 Encounter for screening mammogram for malignant neoplasm of breast: Secondary | ICD-10-CM

## 2019-10-09 LAB — CYTOLOGY - PAP
Adequacy: ABSENT
Diagnosis: REACTIVE
High risk HPV: NEGATIVE

## 2019-10-13 ENCOUNTER — Ambulatory Visit (INDEPENDENT_AMBULATORY_CARE_PROVIDER_SITE_OTHER): Payer: Medicare Other

## 2019-10-13 ENCOUNTER — Other Ambulatory Visit: Payer: Self-pay

## 2019-10-13 DIAGNOSIS — S14109D Unspecified injury at unspecified level of cervical spinal cord, subsequent encounter: Secondary | ICD-10-CM

## 2019-10-14 ENCOUNTER — Encounter: Payer: Self-pay | Admitting: Sports Medicine

## 2019-10-14 DIAGNOSIS — M47812 Spondylosis without myelopathy or radiculopathy, cervical region: Secondary | ICD-10-CM

## 2019-10-14 MED ORDER — GABAPENTIN 800 MG PO TABS: 800.0000 mg | ORAL_TABLET | Freq: Three times a day (TID) | ORAL | 3 refills | Status: DC

## 2019-11-02 ENCOUNTER — Encounter: Payer: Self-pay | Admitting: Sports Medicine

## 2019-11-02 DIAGNOSIS — S14109D Unspecified injury at unspecified level of cervical spinal cord, subsequent encounter: Secondary | ICD-10-CM

## 2019-11-02 DIAGNOSIS — M47812 Spondylosis without myelopathy or radiculopathy, cervical region: Secondary | ICD-10-CM

## 2019-11-05 NOTE — Assessment & Plan Note (Signed)
Spinal cord contusion with a touch of weakness in the left arm but this continues to improve with physical therapy, she has had a solid 6 weeks of physical therapy, it is also almost 3 months post accident. She does have some significant cervical spondylosis as well, persistent pain in spite of high doses of gabapentin, we are going to now proceed with a left C6-C7 interlaminar epidural.

## 2019-11-05 NOTE — Telephone Encounter (Signed)
Epidural ordered, please contact  imaging for scheduling. 

## 2019-11-05 NOTE — Telephone Encounter (Signed)
Left msg for Angelita Ingles to get pt scheduled

## 2019-11-06 ENCOUNTER — Other Ambulatory Visit: Payer: Self-pay | Admitting: Sports Medicine

## 2019-11-11 ENCOUNTER — Other Ambulatory Visit: Payer: Self-pay

## 2019-11-11 ENCOUNTER — Ambulatory Visit
Admission: RE | Admit: 2019-11-11 | Discharge: 2019-11-11 | Disposition: A | Discharge status: Home / Self Care | Payer: Medicare Other | Source: Ambulatory Visit | Attending: Sports Medicine | Admitting: Sports Medicine

## 2019-11-11 MED ORDER — IOPAMIDOL (ISOVUE-M 300) INJECTION 61%
1.0000 mL | Freq: Once | INTRAMUSCULAR | Status: AC | PRN
  Administered 2019-11-11: 1 mL via EPIDURAL

## 2019-11-11 MED ORDER — TRIAMCINOLONE ACETONIDE 40 MG/ML IJ SUSP (RADIOLOGY)
60.0000 mg | Freq: Once | INTRAMUSCULAR | Status: AC
  Administered 2019-11-11: 60 mg via EPIDURAL

## 2019-11-11 NOTE — Discharge Instructions (Signed)

## 2019-11-18 ENCOUNTER — Encounter: Payer: Self-pay | Admitting: Sports Medicine

## 2019-11-18 ENCOUNTER — Telehealth: Payer: Self-pay

## 2019-11-18 NOTE — Telephone Encounter (Signed)
Mrs. Bochenek called to say the CESI she had one week ago has provided her no relief other than on the day of the injection.  After chatting with her for a few minutes about her pain, she agreed to contact Dr. [name] to see what they should do next.  Gypsy Lore, RN

## 2019-12-04 ENCOUNTER — Ambulatory Visit: Payer: Medicare Other | Admitting: Certified Nurse Midwife

## 2019-12-07 ENCOUNTER — Other Ambulatory Visit: Payer: Self-pay

## 2019-12-07 ENCOUNTER — Encounter: Payer: Self-pay | Admitting: Obstetrics & Gynecology

## 2019-12-07 ENCOUNTER — Ambulatory Visit: Payer: Medicare Other | Admitting: Obstetrics & Gynecology

## 2019-12-07 ENCOUNTER — Ambulatory Visit (INDEPENDENT_AMBULATORY_CARE_PROVIDER_SITE_OTHER): Payer: Medicare Other

## 2019-12-07 VITALS — BP 129/86 | HR 66 | Wt 201.0 lb

## 2019-12-07 DIAGNOSIS — N950 Postmenopausal bleeding: Secondary | ICD-10-CM

## 2019-12-07 MED ORDER — MISOPROSTOL 200 MCG PO TABS: ORAL_TABLET | ORAL | 0 refills | Status: DC

## 2019-12-07 NOTE — Progress Notes (Signed)
   Subjective:    Patient ID: Sara [name], female    DOB: Aug 08, 1951, 68 y.o.   MRN: [id number]  HPI 68 yo married P1 (43 yo daughter) here with a 1 week h/o pmb brown spotting, especially with wiping. She cannot think of anything that makes this better or worse. There are no associated symptoms. She has been abstinent for at least a year (Her husband had prostate cancer).  She had a similar occurrence about a year ago and she reports that she had an [facility] at [facility] and that the pathology indicated a polyp.   Review of Systems She is retired, had a normal pap smear 10/20.    Objective:   Physical Exam Breathing, conversing, and ambulating normally Well nourished, well hydrated Black female, no apparent distress Speculum exam reveals a mildly atrophic vagina, parous cervix with a smear of red blood at the os Bimanual exam- normal size and shape, anteverted, mobile, non-tender, normal adnexal exam      Assessment & Plan:  PMB, second occurrence- Gyn u/s ordered She will need a hysteroscopy and d&c. I sent Drema Balzarine a message to schedule this I prescribed cytotec that she will take the night prior to the biopsy.

## 2019-12-07 NOTE — Progress Notes (Signed)
Pt states she has been having "brown and mucous like discharge"

## 2019-12-08 ENCOUNTER — Encounter: Payer: Self-pay | Admitting: Sports Medicine

## 2019-12-08 LAB — LIPID PANEL
Cholesterol: 171 (ref 0–200)
HDL: 57 (ref 35–70)
LDL Cholesterol: 102
Triglycerides: 63 (ref 40–160)

## 2019-12-08 LAB — HEMOGLOBIN A1C: Hemoglobin A1C: 5.4

## 2019-12-08 LAB — BASIC METABOLIC PANEL: Glucose: 86

## 2019-12-10 ENCOUNTER — Other Ambulatory Visit: Payer: Self-pay

## 2019-12-10 ENCOUNTER — Encounter: Payer: Self-pay | Admitting: Sports Medicine

## 2019-12-10 ENCOUNTER — Ambulatory Visit: Payer: Medicare Other | Admitting: Sports Medicine

## 2019-12-10 VITALS — BP 128/88 | HR 65 | Temp 97.9°F | Wt 202.1 lb

## 2019-12-10 DIAGNOSIS — R300 Dysuria: Secondary | ICD-10-CM

## 2019-12-10 DIAGNOSIS — M26623 Arthralgia of bilateral temporomandibular joint: Secondary | ICD-10-CM | POA: Diagnosis not present

## 2019-12-10 DIAGNOSIS — M47812 Spondylosis without myelopathy or radiculopathy, cervical region: Secondary | ICD-10-CM

## 2019-12-10 DIAGNOSIS — B349 Viral infection, unspecified: Secondary | ICD-10-CM

## 2019-12-10 DIAGNOSIS — N3001 Acute cystitis with hematuria: Secondary | ICD-10-CM

## 2019-12-10 DIAGNOSIS — N3000 Acute cystitis without hematuria: Secondary | ICD-10-CM | POA: Insufficient documentation

## 2019-12-10 LAB — POCT URINALYSIS DIPSTICK
Glucose, UA: NEGATIVE
Ketones, UA: NEGATIVE
Leukocytes, UA: NEGATIVE
Nitrite, UA: NEGATIVE
Protein, UA: POSITIVE — AB
Spec Grav, UA: >=1.03 — AB (ref 1.010–1.025)
Urobilinogen, UA: 1 E.U./dL
pH, UA: 6 (ref 5.0–8.0)

## 2019-12-10 MED ORDER — TRAMADOL HCL 50 MG PO TABS: 50.0000 mg | ORAL_TABLET | Freq: Three times a day (TID) | ORAL | 0 refills | Status: DC | PRN

## 2019-12-10 MED ORDER — NITROFURANTOIN MONOHYD MACRO 100 MG PO CAPS: 100.0000 mg | ORAL_CAPSULE | Freq: Two times a day (BID) | ORAL | 0 refills | Status: DC

## 2019-12-10 MED ORDER — PREDNISONE 10 MG (48) PO TBPK: ORAL_TABLET | Freq: Every day | ORAL | 0 refills | Status: DC

## 2019-12-10 NOTE — Assessment & Plan Note (Signed)
Covid testing per patient request, she really has no convincing symptoms.

## 2019-12-10 NOTE — Assessment & Plan Note (Signed)
Sara [name] had slight improvement with her epidural per her report. She still has bilateral upper cervical radicular type symptoms, she still has allodynia and hyperalgesia over both shoulders. Shoulder exam was completely normal. She is taking high-dose gabapentin already, restarting tramadol. We agreed to proceed with epidurals #2 and #3, she does have multilevel cervical spondylosis and central canal stenosis.

## 2019-12-10 NOTE — Progress Notes (Signed)
Subjective:    CC: Multiple issues  HPI: Sara [name] is a pleasant 68 year old female, several months ago she fell down the stairs, she had transient quadriplegia with an MRI that showed spinal cord contusion, she also had significant interspinous ligament and muscular edema.  Since then she is had significant pain in her neck, upper trapezius, shoulders with occasional paresthesias into the hand.  After a protracted time in a cervical collar she did not have any improvement, she also had significant cervical spondylosis and degenerative changes on MRI so we proceeded with a cervical epidural, she has noted partial improvement.  In addition she has started to have dysuria, without flank pain, fevers or chills.  I reviewed the past medical history, family history, social history, surgical history, and allergies today and no changes were needed.  Please see the problem list section below in epic for further details.  Past Medical History: Past Medical History:  Diagnosis Date  . Asthma   . Fibromyalgia    [name]  . Hyperlipidemia   . Hypertension   . Postmenopausal bleeding   . Vaginal Pap smear, abnormal    Past Surgical History: Past Surgical History:  Procedure Laterality Date  . BREAST BIOPSY  1980   neg  . TOTAL KNEE ARTHROPLASTY  2000   Right, Dr. Tonita Cong   Social History: Social History   Socioeconomic History  . Marital status: Married    Spouse name: Not on file  . Number of children: 4  . Years of education: Not on file  . Highest education level: Not on file  Occupational History  . 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  Tobacco Use  . Smoking status: Never Smoker  . Smokeless tobacco: Never Used  Substance and Sexual Activity  . Alcohol use: Yes    Comment: Occasional-Last had a drink a month ago  . Drug use: No  . Sexual activity: Not Currently    Partners: Male    Birth control/protection:  None, Post-menopausal  Other Topics Concern  . Not on file  Social History Narrative   Married to [place].    Has grown children, daughter in [place].   Walks for exercise.   Social Determinants of Health   Financial Resource Strain:   . Difficulty of Paying Living Expenses: Not on file  Food Insecurity:   . Worried About Charity fundraiser in the Last Year: Not on file  . Ran Out of Food in the Last Year: Not on file  Transportation Needs:   . Lack of Transportation (Medical): Not on file  . Lack of Transportation (Non-Medical): Not on file  Physical Activity:   . Days of Exercise per Week: Not on file  . Minutes of Exercise per Session: Not on file  Stress:   . Feeling of Stress : Not on file  Social Connections:   . Frequency of Communication with Friends and Family: Not on file  . Frequency of Social Gatherings with Friends and Family: Not on file  . Attends Religious Services: Not on file  . Active Member of Clubs or Organizations: Not on file  . Attends Archivist Meetings: Not on file  . Marital Status: Not on file   Family History: Family History  Problem Relation Age of Onset  . Diabetes Mother   . Heart attack Mother        First MI at age 69  . Diabetes type II  Mother   . Coronary artery disease Mother   . Hypertension Father   . Diabetes Father   . Alcohol abuse Father   . Pancreatitis Father   . Asthma Daughter   . Asthma Grandchild    Allergies: Allergies  Allergen Reactions  . Bee Venom Anaphylaxis   Medications: See med rec.  Review of Systems: No fevers, chills, night sweats, weight loss, chest pain, or shortness of breath.   Objective:    General: Well Developed, well nourished, and in no acute distress.  Neuro: Alert and oriented x3, extra-ocular muscles intact, sensation grossly intact.  HEENT: Normocephalic, atraumatic, pupils equal round reactive to light, neck supple, no masses, no lymphadenopathy, thyroid nonpalpable.  Skin:  Warm and dry, no rashes. Cardiac: Regular rate and rhythm, no murmurs rubs or gallops, no lower extremity edema.  Respiratory: Clear to auscultation bilaterally. Not using accessory muscles, speaking in full sentences.  Urinalysis positive for blood, high specific gravity.  Sent for culture.  No leukocytes, negative nitrites.  Impression and Recommendations:    Cervical spondylosis Sara [name] had slight improvement with her epidural per her report. She still has bilateral upper cervical radicular type symptoms, she still has allodynia and hyperalgesia over both shoulders. Shoulder exam was completely normal. She is taking high-dose gabapentin already, restarting tramadol. We agreed to proceed with epidurals #2 and #3, she does have multilevel cervical spondylosis and central canal stenosis.  Acute cystitis These cultures were negative. Adding Macrobid, sending this off for culture as well. If these continue with negative cultures we will refer her to urology for further evaluation for interstitial cystitis.  Viral syndrome Covid testing per patient request, she really has no convincing symptoms.   ___________________________________________ [name]. [name], M.D., ABFM., CAQSM. Primary Care and Sports Medicine [facility]  Adjunct Professor of [place] of [facility] of Medicine

## 2019-12-10 NOTE — Assessment & Plan Note (Signed)
These cultures were negative. Adding Macrobid, sending this off for culture as well. If these continue with negative cultures we will refer her to urology for further evaluation for interstitial cystitis.

## 2019-12-11 LAB — URINE CULTURE
MICRO NUMBER:: 1185856
Result:: NO GROWTH
SPECIMEN QUALITY:: ADEQUATE

## 2019-12-11 LAB — NOVEL CORONAVIRUS, NAA: SARS-CoV-2, NAA: NOT DETECTED

## 2019-12-11 LAB — SPECIMEN STATUS REPORT

## 2019-12-18 ENCOUNTER — Other Ambulatory Visit: Payer: Self-pay

## 2019-12-18 ENCOUNTER — Ambulatory Visit
Admission: RE | Admit: 2019-12-18 | Discharge: 2019-12-18 | Disposition: A | Discharge status: Home / Self Care | Payer: Medicare Other | Source: Ambulatory Visit | Attending: Sports Medicine | Admitting: Sports Medicine

## 2019-12-18 MED ORDER — TRIAMCINOLONE ACETONIDE 40 MG/ML IJ SUSP (RADIOLOGY)
60.0000 mg | Freq: Once | INTRAMUSCULAR | Status: AC
  Administered 2019-12-18: 60 mg via EPIDURAL

## 2019-12-18 MED ORDER — IOPAMIDOL (ISOVUE-M 300) INJECTION 61%
1.0000 mL | Freq: Once | INTRAMUSCULAR | Status: AC | PRN
  Administered 2019-12-18: 1 mL via EPIDURAL

## 2019-12-18 NOTE — Discharge Instructions (Signed)

## 2019-12-22 ENCOUNTER — Other Ambulatory Visit: Payer: Self-pay | Admitting: Sports Medicine

## 2019-12-22 ENCOUNTER — Other Ambulatory Visit: Payer: Self-pay | Admitting: *Deleted

## 2019-12-22 DIAGNOSIS — G4700 Insomnia, unspecified: Secondary | ICD-10-CM

## 2019-12-22 MED ORDER — CALCIUM CARBONATE-VITAMIN D 600-400 MG-UNIT PO TABS: 1.0000 | ORAL_TABLET | Freq: Two times a day (BID) | ORAL | 11 refills | Status: AC

## 2019-12-22 MED ORDER — ZOLPIDEM TARTRATE 10 MG PO TABS: 10.0000 mg | ORAL_TABLET | Freq: Every day | ORAL | 0 refills | Status: DC

## 2019-12-28 ENCOUNTER — Other Ambulatory Visit: Payer: Self-pay | Admitting: Sports Medicine

## 2019-12-28 DIAGNOSIS — N62 Hypertrophy of breast: Secondary | ICD-10-CM | POA: Insufficient documentation

## 2019-12-28 DIAGNOSIS — S14109D Unspecified injury at unspecified level of cervical spinal cord, subsequent encounter: Secondary | ICD-10-CM

## 2019-12-28 MED ORDER — MELOXICAM 15 MG PO TABS: ORAL_TABLET | ORAL | 3 refills | Status: DC

## 2019-12-28 NOTE — Assessment & Plan Note (Signed)
Significant neck pain, shoulder grooving from bra strap, excessively large breasts. Referral for consideration of reduction mammoplasty.

## 2020-01-04 NOTE — Addendum Note (Signed)
Addended by: Silverio Decamp on: 01/04/2020 07:43 PM   Modules accepted: Orders

## 2020-01-07 ENCOUNTER — Encounter: Payer: Self-pay | Admitting: Sports Medicine

## 2020-01-19 ENCOUNTER — Other Ambulatory Visit: Payer: Self-pay | Admitting: *Deleted

## 2020-01-19 DIAGNOSIS — T8484XS Pain due to internal orthopedic prosthetic devices, implants and grafts, sequela: Secondary | ICD-10-CM

## 2020-01-19 DIAGNOSIS — Z96652 Presence of left artificial knee joint: Secondary | ICD-10-CM

## 2020-01-19 MED ORDER — TRAZODONE HCL 150 MG PO TABS: 150.0000 mg | ORAL_TABLET | Freq: Every day | ORAL | 3 refills | Status: DC

## 2020-01-19 MED ORDER — DICLOFENAC SODIUM 75 MG PO TBEC: 75.0000 mg | DELAYED_RELEASE_TABLET | Freq: Two times a day (BID) | ORAL | 3 refills | Status: DC

## 2020-01-20 DIAGNOSIS — N62 Hypertrophy of breast: Secondary | ICD-10-CM | POA: Diagnosis not present

## 2020-01-27 ENCOUNTER — Telehealth: Payer: Self-pay | Admitting: *Deleted

## 2020-01-27 NOTE — Telephone Encounter (Signed)
Called patient and informed her that her appointment on Feb 2nd needs to be rescheduled; MD will  be in meeting. Rescheduled for the following day. Patient verbalized understanding, appreciation.

## 2020-01-28 ENCOUNTER — Other Ambulatory Visit: Payer: Self-pay | Admitting: Sports Medicine

## 2020-02-02 ENCOUNTER — Ambulatory Visit: Payer: Self-pay | Admitting: Diagnostic Neuroimaging

## 2020-02-03 ENCOUNTER — Encounter: Payer: Self-pay | Admitting: Diagnostic Neuroimaging

## 2020-02-03 ENCOUNTER — Ambulatory Visit: Payer: Medicare PPO | Admitting: Diagnostic Neuroimaging

## 2020-02-03 ENCOUNTER — Other Ambulatory Visit: Payer: Self-pay

## 2020-02-03 VITALS — Temp 96.8°F | Ht 63.0 in | Wt 207.2 lb

## 2020-02-03 DIAGNOSIS — M542 Cervicalgia: Secondary | ICD-10-CM

## 2020-02-03 DIAGNOSIS — M79602 Pain in left arm: Secondary | ICD-10-CM

## 2020-02-03 DIAGNOSIS — M79601 Pain in right arm: Secondary | ICD-10-CM

## 2020-02-03 DIAGNOSIS — S14109S Unspecified injury at unspecified level of cervical spinal cord, sequela: Secondary | ICD-10-CM

## 2020-02-03 NOTE — Progress Notes (Signed)
[facility]  PATIENT: Sara [name] DOB: [birth year]  REFERRING CLINICIAN: Silverio Decamp,* HISTORY FROM: patient  REASON FOR VISIT: new consult    HISTORICAL  CHIEF COMPLAINT:  Chief Complaint  Patient presents with  . Contusion of cervical cord    rm 6 New Pt, "fell down steps 07/11/19, hospitaliized [facility] x 3 weeks, pain-shoulders/upper arms/neck; tingling in arms/hands; contusion of cervical cord"    HISTORY OF PRESENT ILLNESS:   69 year old female with hypertension, hypercholesterolemia, COPD, chronic pain, depression anxiety here for evaluation of bilateral upper extremity pain, numbness, neck pain.  Patient had trauma on 07/11/2019 after she fell down flight of steps.  Patient sustained cervical spinal cord contusion and spleen hematoma.  Patient had subsequent neck pain, proximal upper extremity pain, numbness which has been slightly improving over time.  In the past few months she has noticed some slight worsening of symptoms.  She feels heaviness, pain and weakness.  She has been on gabapentin with mild relief.  She has tried muscle relaxers in the past.  She is tried tramadol on limited basis.  Due to slightly worsening symptoms patient was referred here for further evaluation.    REVIEW OF SYSTEMS: Full 14 system review of systems performed and negative with exception of: Memory loss confusion insomnia fatigue palpitations joint pain aching muscles anxiety decreased energy.  ALLERGIES: Allergies  Allergen Reactions  . Bee Venom Anaphylaxis    HOME MEDICATIONS: Outpatient Medications Prior to Visit  Medication Sig Dispense Refill  . aspirin 81 MG tablet Take 81 mg by mouth daily.      Marland Kitchen atorvastatin (LIPITOR) 20 MG tablet TAKE 1 TABLET BY MOUTH  DAILY 90 tablet 3  . Calcium Carbonate-Vitamin D 600-400 MG-UNIT tablet Take 1 tablet by mouth 2 (two) times daily. 60 tablet 11  . DEXILANT 60 MG capsule Take 1 capsule by mouth daily.    .  diazepam (VALIUM) 5 MG tablet Take 1 tablet (5 mg total) by mouth daily as needed for anxiety. 30 tablet 0  . diclofenac sodium (VOLTAREN) 1 % GEL Apply 2 g topically 4 (four) times daily. 100 g 1  . EPINEPHrine 0.3 mg/0.3 mL IJ SOAJ injection Inject 0.3 mLs (0.3 mg total) into the muscle once. 1 Device 11  . gabapentin (NEURONTIN) 800 MG tablet Take 1 tablet (800 mg total) by mouth 3 (three) times daily. 90 tablet 3  . meloxicam (MOBIC) 15 MG tablet One tab PO qAM with a meal for 2 weeks, then daily prn pain. 30 tablet 3  . metoprolol succinate (TOPROL-XL) 50 MG 24 hr tablet TAKE 1 TABLET BY MOUTH  DAILY WITH OR IMMEDIATELY  FOLLOWING A MEAL 90 tablet 3  . potassium chloride SA (K-DUR) 20 MEQ tablet TAKE 1 TABLET BY MOUTH  DAILY (OR ONLY WHEN TAKING  DEMADEX OR FUROSEMIDE) 90 tablet 3  . sertraline (ZOLOFT) 100 MG tablet Take 1 tablet (100 mg total) by mouth daily.    . traZODone (DESYREL) 150 MG tablet Take 1 tablet (150 mg total) by mouth at bedtime. 90 tablet 3  . zolpidem (AMBIEN) 10 MG tablet Take 1 tablet (10 mg total) by mouth at bedtime. 90 tablet 0  . COMBIPATCH 0.05-0.14 MG/DAY     . diclofenac (VOLTAREN) 75 MG EC tablet Take 1 tablet (75 mg total) by mouth 2 (two) times daily. 180 tablet 3  . LINZESS 145 MCG CAPS capsule TAKE ONE CAPSULE (145 MCG TOTAL) BY MOUTH DAILY.  5  .  misoprostol (CYTOTEC) 200 MCG tablet Take 3 pills by mouth the night before biopsy. 3 tablet 0  . nitrofurantoin, macrocrystal-monohydrate, (MACROBID) 100 MG capsule Take 1 capsule (100 mg total) by mouth 2 (two) times daily. 14 capsule 0  . predniSONE (STERAPRED UNI-PAK 48 TAB) 10 MG (48) TBPK tablet Take by mouth daily. 12-day taper pack, use as directed for taper 1 tablet 0  . traMADol (ULTRAM) 50 MG tablet Take 1 tablet (50 mg total) by mouth 3 (three) times daily as needed. 90 tablet 0   No facility-administered medications prior to visit.    PAST MEDICAL HISTORY: Past Medical History:  Diagnosis Date  .  Allergic rhinitis   . Asthma   . Fibromyalgia    [name]  . Hyperlipidemia   . Hypertension   . Insomnia 2000  . Lumbar spondylosis   . Postmenopausal bleeding   . Vaginal Pap smear, abnormal     PAST SURGICAL HISTORY: Past Surgical History:  Procedure Laterality Date  . BREAST BIOPSY  1980   neg  . TOTAL KNEE ARTHROPLASTY Left 2000, 2015    Dr. Tonita Cong    FAMILY HISTORY: Family History  Problem Relation Age of Onset  . Diabetes Mother   . Heart attack Mother        First MI at age 71  . Diabetes type II Mother   . Coronary artery disease Mother   . Hypertension Father   . Diabetes Father   . Alcohol abuse Father   . Pancreatitis Father   . Asthma Daughter   . Asthma Grandchild     SOCIAL HISTORY: Social History   Socioeconomic History  . Marital status: Married    Spouse name: Gwyndolyn Saxon  . Number of children: 4  . Years of education: Not on file  . Highest education level: Master's degree (e.g., MA, MS, MEng, MEd, MSW, MBA)  Occupational History  . 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 Smoking status: Never Smoker  . Smokeless tobacco: Never Used  Substance and Sexual Activity  . Alcohol use: Yes    Comment: Occasional-Last had a drink a month ago  . Drug use: No  . Sexual activity: Not Currently    Partners: Male    Birth control/protection: None, Post-menopausal  Other Topics Concern  . Not on file  Social History Narrative   Married to [place].    Has grown children, daughter in [place].   Walks for exercise.   Caffeine- 1 c daily   Social Determinants of Health   Financial Resource Strain:   . Difficulty of Paying Living Expenses: Not on file  Food Insecurity:   . Worried About Charity fundraiser in the Last Year: Not on file  . Ran Out of Food in the Last Year: Not on file  Transportation Needs:   . Lack of Transportation (Medical): Not on file  .  Lack of Transportation (Non-Medical): Not on file  Physical Activity:   . Days of Exercise per Week: Not on file  . Minutes of Exercise per Session: Not on file  Stress:   . Feeling of Stress : Not on file  Social Connections:   . Frequency of Communication with Friends and Family: Not on file  . Frequency of Social Gatherings with Friends and Family: Not on file  . Attends Religious Services: Not on file  . Active Member of Clubs or Organizations:  Not on file  . Attends Archivist Meetings: Not on file  . Marital Status: Not on file  Intimate Partner Violence:   . Fear of Current or Ex-Partner: Not on file  . Emotionally Abused: Not on file  . Physically Abused: Not on file  . Sexually Abused: Not on file     PHYSICAL EXAM  GENERAL EXAM/CONSTITUTIONAL: Vitals:  Vitals:   02/03/20 0904  Temp: (!) 96.8 F (36 C)  Weight: 207 lb 3.2 oz (94 kg)  Height: 5\' 3"  (1.6 m)     Body mass index is 36.7 kg/m. Wt Readings from Last 3 Encounters:  02/03/20 207 lb 3.2 oz (94 kg)  12/10/19 202 lb 1.9 oz (91.7 kg)  12/07/19 201 lb (91.2 kg)     Patient is in no distress; well developed, nourished and groomed; neck is supple  CARDIOVASCULAR:  Examination of carotid arteries is normal; no carotid bruits  Regular rate and rhythm, no murmurs  Examination of peripheral vascular system by observation and palpation is normal  EYES:  Ophthalmoscopic exam of optic discs and posterior segments is normal; no papilledema or hemorrhages  No exam data present  MUSCULOSKELETAL:  Gait, strength, tone, movements noted in Neurologic exam below  NEUROLOGIC: MENTAL STATUS:  No flowsheet data found.  awake, alert, oriented to person, place and time  recent and remote memory intact  normal attention and concentration  language fluent, comprehension intact, naming intact  fund of knowledge appropriate  CRANIAL NERVE:   2nd - no papilledema on fundoscopic exam  2nd,  3rd, 4th, 6th - pupils equal and reactive to light, visual fields full to confrontation, extraocular muscles intact, no nystagmus  5th - facial sensation symmetric  7th - facial strength symmetric  8th - hearing intact  9th - palate elevates symmetrically, uvula midline  11th - shoulder shrug symmetric  12th - tongue protrusion midline  MOTOR:   normal bulk and tone, full strength in the BUE, BLE  SENSORY:   normal and symmetric to light touch  COORDINATION:   finger-nose-finger, fine finger movements normal  REFLEXES:   deep tendon reflexes BRISK IN BUE and symmetric; BLE 1; POSITIVE HOFFMANS  GAIT/STATION:   narrow based gait; CAUTIOUS GAIT     DIAGNOSTIC DATA (LABS, IMAGING, TESTING) - I reviewed patient records, labs, notes, testing and imaging myself where available.  Lab Results  Component Value Date   WBC 9.4 01/21/2019   HGB 14.6 01/21/2019   HCT 41.9 01/21/2019   MCV 93.1 01/21/2019   PLT 392 01/21/2019      Component Value Date/Time   NA 141 01/21/2019 1000   K 3.6 01/21/2019 1000   CL 103 01/21/2019 1000   CO2 27 01/21/2019 1000   GLUCOSE 109 (H) 01/21/2019 1000   BUN 18 01/21/2019 1000   CREATININE 1.27 (H) 01/21/2019 1000   CALCIUM 9.8 01/21/2019 1000   PROT 7.4 01/21/2019 1000   ALBUMIN 4.5 09/20/2016 0912   AST 18 01/21/2019 1000   ALT 14 01/21/2019 1000   ALKPHOS 52 09/20/2016 0912   BILITOT 0.6 01/21/2019 1000   GFRNONAA 67 09/04/2012 0911   GFRAA 77 09/04/2012 0911   Lab Results  Component Value Date   CHOL 171 12/08/2019   HDL 57 12/08/2019   LDLCALC 102 12/08/2019   LDLDIRECT 91 07/15/2018   TRIG 63 12/08/2019   CHOLHDL 2.5 09/20/2016   Lab Results  Component Value Date   HGBA1C 5.4 12/08/2019   Lab Results  Component Value Date   [id number] 02/05/2017   Lab Results  Component Value Date   TSH 0.98 01/21/2019    10/13/19 MRI cervical spine [I reviewed images myself and agree with interpretation. -VRP]    1. Mild cord swelling and edema at the C3-4 level suggesting the possibility of a mild cord injury sustained at the time of the described injury. There does not appear to be ongoing cord compression at this level, with AP diameter of the canal 9-10 mm.  2. Degenerative spondylosis at C3-4 with mild canal stenosis and mild bilateral foraminal stenosis. 3. Facet arthritis with edema on the right at C3-4 which could be degenerative or relate to a previous injury. 4. Degenerative spondylosis at C5-6 and C6-7 with mild foraminal narrowing on the left at those levels.     ASSESSMENT AND PLAN  69 y.o. year old female here with increasing neck pain and arm pain, since spinal cord injury in July 2020.  Dx:  1. Contusion of cervical cord, sequela ([place])   2. Neck pain   3. Pain in both upper extremities     PLAN:  NECK PAIN / ARM PAIN --> likely due to TRAUMATIC SPINAL CORD INJURY (July 2020) + degenerative cervical spine disease; subjective weakness likely related to pain - continue pain mgmt with gabapentin, tramadol per Dr. [name] - consider follow up with Dr. Brigitte Pulse (spine surgery) if symptoms significantly worsen  Return for pending if symptoms worsen or fail to improve, return to PCP.    Penni Bombard, MD [id number], [id number] AM Certified in Neurology, Neurophysiology and Neuroimaging  [facility] [address], [place], [place] [address] [phone]

## 2020-02-03 NOTE — Patient Instructions (Signed)
  NECK PAIN / ARM PAIN --> due to TRAUMATIC SPINAL CORD INJURY (July 2020) + degenerative cervical spine disease; subjective weakness likely related to pain - continue pain mgmt with gabapentin, tramadol per Dr. [name] - consider follow up with Dr. Brigitte Pulse (spine surgery) if symptoms significantly worsen

## 2020-02-04 ENCOUNTER — Telehealth: Payer: Self-pay | Admitting: *Deleted

## 2020-02-04 MED ORDER — MISOPROSTOL 200 MCG PO TABS: ORAL_TABLET | ORAL | 0 refills | Status: DC

## 2020-02-04 MED ORDER — TIZANIDINE HCL 4 MG PO TABS: 4.0000 mg | ORAL_TABLET | Freq: Three times a day (TID) | ORAL | 2 refills | Status: DC | PRN

## 2020-02-04 NOTE — Telephone Encounter (Signed)
Pt called stating that she was suppose to have a RX for Cytotec to take the night prior to her surgery.  Per Dr Alease Medina note in 12/20 pt was to get Cytotec.  RX sent to [facility].

## 2020-02-08 ENCOUNTER — Other Ambulatory Visit: Payer: Self-pay | Admitting: Sports Medicine

## 2020-02-10 ENCOUNTER — Other Ambulatory Visit: Payer: Self-pay

## 2020-02-10 ENCOUNTER — Other Ambulatory Visit: Payer: Self-pay | Admitting: Sports Medicine

## 2020-02-10 ENCOUNTER — Encounter (HOSPITAL_BASED_OUTPATIENT_CLINIC_OR_DEPARTMENT_OTHER): Payer: Self-pay | Admitting: Obstetrics & Gynecology

## 2020-02-10 DIAGNOSIS — M47812 Spondylosis without myelopathy or radiculopathy, cervical region: Secondary | ICD-10-CM

## 2020-02-12 ENCOUNTER — Encounter (HOSPITAL_BASED_OUTPATIENT_CLINIC_OR_DEPARTMENT_OTHER)
Admission: RE | Admit: 2020-02-12 | Discharge: 2020-02-12 | Disposition: A | Discharge status: Home / Self Care | Payer: Medicare PPO | Source: Ambulatory Visit | Attending: Obstetrics & Gynecology | Admitting: Obstetrics & Gynecology

## 2020-02-12 ENCOUNTER — Other Ambulatory Visit: Payer: Self-pay

## 2020-02-12 DIAGNOSIS — Z01810 Encounter for preprocedural cardiovascular examination: Secondary | ICD-10-CM | POA: Diagnosis not present

## 2020-02-12 DIAGNOSIS — N950 Postmenopausal bleeding: Secondary | ICD-10-CM | POA: Diagnosis not present

## 2020-02-13 ENCOUNTER — Other Ambulatory Visit (HOSPITAL_COMMUNITY)
Admission: RE | Admit: 2020-02-13 | Discharge: 2020-02-13 | Disposition: A | Discharge status: Home / Self Care | Payer: Medicare PPO | Source: Ambulatory Visit | Attending: Obstetrics & Gynecology | Admitting: Obstetrics & Gynecology

## 2020-02-13 DIAGNOSIS — Z20822 Contact with and (suspected) exposure to covid-19: Secondary | ICD-10-CM | POA: Diagnosis not present

## 2020-02-13 DIAGNOSIS — Z01812 Encounter for preprocedural laboratory examination: Secondary | ICD-10-CM | POA: Insufficient documentation

## 2020-02-13 LAB — SARS CORONAVIRUS 2 (TAT 6-24 HRS): SARS Coronavirus 2: NEGATIVE

## 2020-02-17 ENCOUNTER — Ambulatory Visit (HOSPITAL_BASED_OUTPATIENT_CLINIC_OR_DEPARTMENT_OTHER): Payer: Medicare PPO | Admitting: Anesthesiology

## 2020-02-17 ENCOUNTER — Encounter (HOSPITAL_BASED_OUTPATIENT_CLINIC_OR_DEPARTMENT_OTHER)
Admission: RE | Disposition: A | Discharge status: Home / Self Care | Payer: Self-pay | Source: Home / Self Care | Attending: Obstetrics & Gynecology

## 2020-02-17 ENCOUNTER — Encounter (HOSPITAL_BASED_OUTPATIENT_CLINIC_OR_DEPARTMENT_OTHER): Payer: Self-pay | Admitting: Obstetrics & Gynecology

## 2020-02-17 ENCOUNTER — Ambulatory Visit (HOSPITAL_BASED_OUTPATIENT_CLINIC_OR_DEPARTMENT_OTHER)
Admission: RE | Admit: 2020-02-17 | Discharge: 2020-02-17 | Disposition: A | Discharge status: Home / Self Care | Payer: Medicare PPO | Attending: Obstetrics & Gynecology | Admitting: Obstetrics & Gynecology

## 2020-02-17 ENCOUNTER — Other Ambulatory Visit: Payer: Self-pay

## 2020-02-17 DIAGNOSIS — N840 Polyp of corpus uteri: Secondary | ICD-10-CM | POA: Insufficient documentation

## 2020-02-17 DIAGNOSIS — F419 Anxiety disorder, unspecified: Secondary | ICD-10-CM | POA: Diagnosis not present

## 2020-02-17 DIAGNOSIS — I10 Essential (primary) hypertension: Secondary | ICD-10-CM | POA: Diagnosis not present

## 2020-02-17 DIAGNOSIS — Z79899 Other long term (current) drug therapy: Secondary | ICD-10-CM | POA: Insufficient documentation

## 2020-02-17 DIAGNOSIS — Z96652 Presence of left artificial knee joint: Secondary | ICD-10-CM | POA: Diagnosis not present

## 2020-02-17 DIAGNOSIS — Z7982 Long term (current) use of aspirin: Secondary | ICD-10-CM | POA: Diagnosis not present

## 2020-02-17 DIAGNOSIS — J449 Chronic obstructive pulmonary disease, unspecified: Secondary | ICD-10-CM | POA: Diagnosis not present

## 2020-02-17 DIAGNOSIS — N950 Postmenopausal bleeding: Secondary | ICD-10-CM | POA: Insufficient documentation

## 2020-02-17 DIAGNOSIS — E785 Hyperlipidemia, unspecified: Secondary | ICD-10-CM | POA: Insufficient documentation

## 2020-02-17 DIAGNOSIS — G4700 Insomnia, unspecified: Secondary | ICD-10-CM | POA: Diagnosis not present

## 2020-02-17 DIAGNOSIS — K219 Gastro-esophageal reflux disease without esophagitis: Secondary | ICD-10-CM | POA: Diagnosis not present

## 2020-02-17 HISTORY — PX: DILATATION & CURETTAGE/HYSTEROSCOPY WITH MYOSURE: SHX6511

## 2020-02-17 HISTORY — DX: Other specified postprocedural states: R11.2

## 2020-02-17 HISTORY — DX: Other specified postprocedural states: Z98.890

## 2020-02-17 HISTORY — DX: Anxiety disorder, unspecified: F41.9

## 2020-02-17 SURGERY — DILATATION & CURETTAGE/HYSTEROSCOPY WITH MYOSURE
Anesthesia: General | Site: Vagina

## 2020-02-17 MED ORDER — FENTANYL CITRATE (PF) 100 MCG/2ML IJ SOLN
INTRAMUSCULAR | Status: AC
  Filled 2020-02-17: qty 2

## 2020-02-17 MED ORDER — DEXAMETHASONE SODIUM PHOSPHATE 10 MG/ML IJ SOLN
INTRAMUSCULAR | Status: DC | PRN
  Administered 2020-02-17: 10 mg via INTRAVENOUS

## 2020-02-17 MED ORDER — MIDAZOLAM HCL 2 MG/2ML IJ SOLN: 1.0000 mg | INTRAMUSCULAR | Status: DC | PRN

## 2020-02-17 MED ORDER — DEXAMETHASONE SODIUM PHOSPHATE 10 MG/ML IJ SOLN
INTRAMUSCULAR | Status: AC
  Filled 2020-02-17: qty 1

## 2020-02-17 MED ORDER — LIDOCAINE 2% (20 MG/ML) 5 ML SYRINGE
INTRAMUSCULAR | Status: DC | PRN
  Administered 2020-02-17: 100 mg via INTRAVENOUS

## 2020-02-17 MED ORDER — BUPIVACAINE HCL (PF) 0.5 % IJ SOLN
INTRAMUSCULAR | Status: DC | PRN
  Administered 2020-02-17: 30 mL

## 2020-02-17 MED ORDER — ONDANSETRON HCL 4 MG/2ML IJ SOLN
INTRAMUSCULAR | Status: AC
  Filled 2020-02-17: qty 2

## 2020-02-17 MED ORDER — LACTATED RINGERS IV SOLN: INTRAVENOUS | Status: DC

## 2020-02-17 MED ORDER — PROPOFOL 10 MG/ML IV BOLUS
INTRAVENOUS | Status: AC
  Filled 2020-02-17: qty 20

## 2020-02-17 MED ORDER — PROPOFOL 10 MG/ML IV BOLUS
INTRAVENOUS | Status: DC | PRN
  Administered 2020-02-17: 160 mg via INTRAVENOUS

## 2020-02-17 MED ORDER — BUPIVACAINE HCL (PF) 0.5 % IJ SOLN
INTRAMUSCULAR | Status: AC
  Filled 2020-02-17: qty 30

## 2020-02-17 MED ORDER — ONDANSETRON HCL 4 MG/2ML IJ SOLN
INTRAMUSCULAR | Status: DC | PRN
  Administered 2020-02-17: 4 mg via INTRAVENOUS

## 2020-02-17 MED ORDER — OXYCODONE HCL 5 MG PO TABS
5.0000 mg | ORAL_TABLET | Freq: Once | ORAL | Status: AC
  Administered 2020-02-17: 16:00:00 5 mg via ORAL

## 2020-02-17 MED ORDER — LIDOCAINE 2% (20 MG/ML) 5 ML SYRINGE
INTRAMUSCULAR | Status: AC
  Filled 2020-02-17: qty 5

## 2020-02-17 MED ORDER — OXYCODONE-ACETAMINOPHEN 5-325 MG PO TABS: 1.0000 | ORAL_TABLET | Freq: Four times a day (QID) | ORAL | 0 refills | Status: DC | PRN

## 2020-02-17 MED ORDER — MIDAZOLAM HCL 2 MG/2ML IJ SOLN
INTRAMUSCULAR | Status: AC
  Filled 2020-02-17: qty 2

## 2020-02-17 MED ORDER — SODIUM CHLORIDE 0.9 % IR SOLN
Status: DC | PRN
  Administered 2020-02-17: 3000 mL

## 2020-02-17 MED ORDER — FENTANYL CITRATE (PF) 100 MCG/2ML IJ SOLN
50.0000 ug | INTRAMUSCULAR | Status: DC | PRN
  Administered 2020-02-17 (×2): 25 ug via INTRAVENOUS

## 2020-02-17 MED ORDER — FENTANYL CITRATE (PF) 100 MCG/2ML IJ SOLN
INTRAMUSCULAR | Status: DC | PRN
  Administered 2020-02-17 (×2): 50 ug via INTRAVENOUS

## 2020-02-17 MED ORDER — MIDAZOLAM HCL 5 MG/5ML IJ SOLN
INTRAMUSCULAR | Status: DC | PRN
  Administered 2020-02-17: 2 mg via INTRAVENOUS

## 2020-02-17 MED ORDER — OXYCODONE HCL 5 MG PO TABS
ORAL_TABLET | ORAL | Status: AC
  Filled 2020-02-17: qty 1

## 2020-02-17 SURGICAL SUPPLY — 19 items
CATH ROBINSON RED A/P 16FR (CATHETERS) ×2 IMPLANT
DEVICE MYOSURE LITE (MISCELLANEOUS) IMPLANT
DEVICE MYOSURE REACH (MISCELLANEOUS) IMPLANT
GAUZE 4X4 16PLY RFD (DISPOSABLE) ×2 IMPLANT
GLOVE BIO SURGEON STRL SZ 6.5 (GLOVE) ×2 IMPLANT
GLOVE BIOGEL PI IND STRL 7.0 (GLOVE) ×1 IMPLANT
GLOVE BIOGEL PI INDICATOR 7.0 (GLOVE) ×1
GOWN STRL REUS W/ TWL XL LVL3 (GOWN DISPOSABLE) ×1 IMPLANT
GOWN STRL REUS W/TWL LRG LVL3 (GOWN DISPOSABLE) ×2 IMPLANT
GOWN STRL REUS W/TWL XL LVL3 (GOWN DISPOSABLE) ×1
KIT PROCEDURE FLUENT (KITS) ×2 IMPLANT
NEEDLE SPNL 18GX3.5 QUINCKE PK (NEEDLE) ×2 IMPLANT
PACK VAGINAL MINOR WOMEN LF (CUSTOM PROCEDURE TRAY) ×2 IMPLANT
PAD OB MATERNITY 4.3X12.25 (PERSONAL CARE ITEMS) ×2 IMPLANT
PAD PREP 24X48 CUFFED NSTRL (MISCELLANEOUS) ×2 IMPLANT
SEAL ROD LENS SCOPE MYOSURE (ABLATOR) ×2 IMPLANT
SLEEVE SCD COMPRESS KNEE MED (MISCELLANEOUS) ×4 IMPLANT
SYR 30ML LL (SYRINGE) ×2 IMPLANT
TOWEL GREEN STERILE FF (TOWEL DISPOSABLE) ×4 IMPLANT

## 2020-02-17 NOTE — Anesthesia Postprocedure Evaluation (Signed)
Anesthesia Post Note  Patient: Sara [name]  Procedure(s) Performed: DILATATION & CURETTAGE/HYSTEROSCOPY WITH MYOSURE POLYPECTOMY (N/A Vagina )     Patient location during evaluation: PACU Anesthesia Type: General Level of consciousness: sedated and patient cooperative Pain management: pain level controlled Vital Signs Assessment: post-procedure vital signs reviewed and stable Respiratory status: spontaneous breathing Cardiovascular status: stable Anesthetic complications: no    Last Vitals:  Vitals:   02/17/20 1430 02/17/20 1500  BP: 140/77 (!) 148/92  Pulse: 61 63  Resp: (!) 9 16  Temp:  36.5 C  SpO2: 99% 95%    Last Pain:  Vitals:   02/17/20 1500  TempSrc:   PainSc: [place]

## 2020-02-17 NOTE — Op Note (Signed)
02/17/2020  1:43 PM  PATIENT:  Sara [name]  69 y.o. female  PRE-OPERATIVE DIAGNOSIS:  POST MENOPAUSAL BLEEDING  POST-OPERATIVE DIAGNOSIS:  post menopausal bleeding + uterine polyp  PROCEDURE:  Hysteroscopy, Myosure polyp resection, dilation and curettage  SURGEON:  Surgeon(s) and Role:    * Clemmie Buelna C, MD - Primary  ANESTHESIA:   local and general  EBL:  5 mL   BLOOD ADMINISTERED:none  DRAINS: none   LOCAL MEDICATIONS USED:  MARCAINE     SPECIMEN:  Source of Specimen:  uterine polyp and endometrial curettings  DISPOSITION OF SPECIMEN:  PATHOLOGY  COUNTS:  YES  TOURNIQUET:  * No tourniquets in log *  DICTATION: .Dragon Dictation  PLAN OF CARE: Discharge to home after PACU  PATIENT DISPOSITION:  PACU - hemodynamically stable.   Delay start of Pharmacological VTE agent (>24hrs) due to surgical blood loss or risk of bleeding: not applicable    The risks, benefits, and alternatives of surgery were explained, understood, and accepted. All questions were answered. Consents were signed. In the operating room genereal anesthesia was applied without complication, and she was placed in the dorsal lithotomy position. Her vagina was prepped and draped in the usual sterile fashion. A bimanual exam revealed a small retroverted mobile uterus. Her adnexa were nonenlarged. A speculum was placed and a single-tooth tenaculum was used to grasp the posterior lip of her cervix. I carefully examined the entire vagina and cervicovaginal junction. I did not see any nodules or other abnormalities.   A total of 30 mL of 0.5% Marcaine was used to perform a paracervical block. Her uterus sounded to 8 cm. Her cervix was carefully and slowly dilated to accommodate the Myosure hysteroscope. Hysteroscopy was done. There was a long dangling uterine polyp. The remainder of the endometrium was atrophic. Both tubal ostia were noted. I used the myosure device to remove the polyp.  A curettage was done in all  quadrants and the fundus of the uterus.  A gritty sensation was appreciated throughout. There was no bleeding noted at the end of the case. She was taken to the recovery room after being extubated. She tolerated the procedure well.

## 2020-02-17 NOTE — Anesthesia Preprocedure Evaluation (Addendum)
Anesthesia Evaluation  Patient identified by MRN, date of birth, ID band Patient awake    Reviewed: Allergy & Precautions, NPO status , Patient's Chart, lab work & pertinent test results, reviewed documented beta blocker date and time   History of Anesthesia Complications (+) PONV and history of anesthetic complications  Airway Mallampati: II  TM Distance: >3 FB Neck ROM: Full    Dental no notable dental hx. (+) Dental Advisory Given, Teeth Intact   Pulmonary asthma , COPD,    Pulmonary exam normal breath sounds clear to auscultation       Cardiovascular hypertension, Pt. on medications and Pt. on home beta blockers Normal cardiovascular exam Rhythm:Regular Rate:Normal     Neuro/Psych  Headaches, negative psych ROS   GI/Hepatic Neg liver ROS, GERD  ,  Endo/Other  negative endocrine ROS  Renal/GU negative Renal ROS     Musculoskeletal  (+) Arthritis , Fibromyalgia -  Abdominal (+) + obese,   Peds  Hematology negative hematology ROS (+)   Anesthesia Other Findings   Reproductive/Obstetrics negative OB ROS                            Anesthesia Physical Anesthesia Plan  ASA: II  Anesthesia Plan: General   Post-op Pain Management:    Induction: Intravenous  PONV Risk Score and Plan: 4 or greater and Ondansetron, Dexamethasone, Treatment may vary due to age or medical condition and Midazolam  Airway Management Planned: LMA  Additional Equipment: None  Intra-op Plan:   Post-operative Plan: Extubation in OR  Informed Consent: I have reviewed the patients History and Physical, chart, labs and discussed the procedure including the risks, benefits and alternatives for the proposed anesthesia with the patient or authorized representative who has indicated his/her understanding and acceptance.     Dental advisory given  Plan Discussed with: CRNA  Anesthesia Plan Comments:         Anesthesia Quick Evaluation

## 2020-02-17 NOTE — H&P (Signed)
Sara [name] is an 69 y.o.  married P1 (34 yo daughter) here with a 3 months of  pmb brown spotting, especially with wiping. She cannot think of anything that makes this better or worse. There are no associated symptoms. She has been abstinent for at least a year (Her husband had prostate cancer).  She had a similar occurrence about a year ago and she reports that she had an [facility]MBX at [facility] and that the pathology indicated a polyp.   No LMP recorded. Patient is postmenopausal.    Past Medical History:  Diagnosis Date  . Allergic rhinitis   . Anxiety   . Asthma   . Fibromyalgia    [name]  . Hyperlipidemia   . Hypertension   . Insomnia 2000  . Lumbar spondylosis   . PONV (postoperative nausea and vomiting)   . Postmenopausal bleeding   . Vaginal Pap smear, abnormal     Past Surgical History:  Procedure Laterality Date  . BREAST BIOPSY  1980   neg  . TOTAL KNEE ARTHROPLASTY Left 2000, 2015    Dr. Tonita Cong    Family History  Problem Relation Age of Onset  . Diabetes Mother   . Heart attack Mother        First MI at age 51  . Diabetes type II Mother   . Coronary artery disease Mother   . Hypertension Father   . Diabetes Father   . Alcohol abuse Father   . Pancreatitis Father   . Asthma Daughter   . Asthma Grandchild     Social History:  reports that she has never smoked. She has never used smokeless tobacco. She reports current alcohol use. She reports that she does not use drugs.  Allergies:  Allergies  Allergen Reactions  . Bee Venom Anaphylaxis    Medications Prior to Admission  Medication Sig Dispense Refill Last Dose  . aspirin 81 MG tablet Take 81 mg by mouth daily.     Past Week at Unknown time  . atorvastatin (LIPITOR) 20 MG tablet TAKE 1 TABLET BY MOUTH  DAILY 90 tablet 3 02/16/2020 at Unknown time  . Calcium Carbonate-Vitamin D 600-400 MG-UNIT tablet Take 1 tablet by mouth 2 (two) times daily. 60 tablet 11 Past Month at Unknown time  . DEXILANT 60  MG capsule Take 1 capsule by mouth daily.   02/16/2020 at Unknown time  . diazepam (VALIUM) 5 MG tablet Take 1 tablet (5 mg total) by mouth daily as needed for anxiety. 30 tablet 0 Past Month at Unknown time  . gabapentin (NEURONTIN) 800 MG tablet TAKE 1 TABLET BY MOUTH THREE TIMES A DAY 90 tablet 3 02/17/2020 at 0730  . meloxicam (MOBIC) 15 MG tablet One tab PO qAM with a meal for 2 weeks, then daily prn pain. 30 tablet 3 02/16/2020 at Unknown time  . metoprolol succinate (TOPROL-XL) 50 MG 24 hr tablet TAKE 1 TABLET BY MOUTH  DAILY WITH OR IMMEDIATELY  FOLLOWING A MEAL 90 tablet 3 02/17/2020 at 0730  . misoprostol (CYTOTEC) 200 MCG tablet Take 3 tablets PO the night prior to procedure 3 tablet 0 02/16/2020 at Unknown time  . potassium chloride SA (K-DUR) 20 MEQ tablet TAKE 1 TABLET BY MOUTH  DAILY (OR ONLY WHEN TAKING  DEMADEX OR FUROSEMIDE) 90 tablet 3 02/16/2020 at Unknown time  . sertraline (ZOLOFT) 100 MG tablet Take 1 tablet (100 mg total) by mouth daily.   02/16/2020 at Unknown time  . tiZANidine (ZANAFLEX) 4 MG  tablet Take 1 tablet (4 mg total) by mouth every 8 (eight) hours as needed for muscle spasms. 30 tablet 2 02/16/2020 at Unknown time  . traZODone (DESYREL) 150 MG tablet Take 1 tablet (150 mg total) by mouth at bedtime. 90 tablet 3 02/16/2020 at Unknown time  . zolpidem (AMBIEN) 10 MG tablet Take 1 tablet (10 mg total) by mouth at bedtime. 90 tablet 0 02/16/2020 at Unknown time  . diclofenac sodium (VOLTAREN) 1 % GEL Apply 2 g topically 4 (four) times daily. 100 g 1 More than a month at Unknown time  . EPINEPHrine 0.3 mg/0.3 mL IJ SOAJ injection Inject 0.3 mLs (0.3 mg total) into the muscle once. 1 Device 11 Unknown at Unknown time    Review of Systems Homemaker  Blood pressure 126/83, pulse 66, temperature 98.1 F (36.7 C), temperature source Oral, resp. rate 16, height 5\' 3"  (1.6 m), weight 92.9 kg, SpO2 98 %. Physical Exam  Breathing, conversing, and ambulating normally Heart-  rrr Lungs- CTAB Abd- benign  No results found for this or any previous visit (from the past 24 hour(s)).  No results found.  Assessment/Plan: PMB- plan for hysteroscopy, d&c, and resection/evaluation of the ultrasound noted 14 mm nodule at the top of the vagina.  Emily Filbert 02/17/2020, 12:35 PM

## 2020-02-17 NOTE — Anesthesia Procedure Notes (Signed)
Procedure Name: LMA Insertion Date/Time: 02/17/2020 1:14 PM Performed by: Genelle Bal, CRNA Pre-anesthesia Checklist: Patient identified, Emergency Drugs available, Suction available and Patient being monitored Patient Re-evaluated:Patient Re-evaluated prior to induction Oxygen Delivery Method: Circle system utilized Preoxygenation: Pre-oxygenation with 100% oxygen Induction Type: IV induction Ventilation: Mask ventilation without difficulty LMA: LMA inserted LMA Size: 4.0 Number of attempts: 1 Airway Equipment and Method: Bite block Placement Confirmation: positive ETCO2 Tube secured with: Tape Dental Injury: Teeth and Oropharynx as per pre-operative assessment

## 2020-02-17 NOTE — Discharge Instructions (Signed)
Dilation and Curettage or Vacuum Curettage, Care After This sheet gives you information about how to care for yourself after your procedure. Your health care provider may also give you more specific instructions. If you have problems or questions, contact your health care provider. What can I expect after the procedure? After your procedure, it is common to have:  Mild pain or cramping.  Some vaginal bleeding or spotting. These may last for up to 2 weeks after your procedure. Follow these instructions at home: Activity   Do not drive or use heavy machinery while taking prescription pain medicine.  Avoid driving for the first 24 hours after your procedure.  Take frequent, short walks, followed by rest periods, throughout the day. Ask your health care provider what activities are safe for you. After 1-2 days, you may be able to return to your normal activities.  Do not lift anything heavier than 10 lb (4.5 kg) until your health care provider approves.  For at least 2 weeks, or as long as told by your health care provider, do not: ? Douche. ? Use tampons. ? Have sexual intercourse. General instructions   Take over-the-counter and prescription medicines only as told by your health care provider. This is especially important if you take blood thinning medicine.  Do not take baths, swim, or use a hot tub until your health care provider approves. Take showers instead of baths.  Wear compression stockings as told by your health care provider. These stockings help to prevent blood clots and reduce swelling in your legs.  It is your responsibility to get the results of your procedure. Ask your health care provider, or the department performing the procedure, when your results will be ready.  Keep all follow-up visits as told by your health care provider. This is important. Contact a health care provider if:  You have severe cramps that get worse or that do not get better with  medicine.  You have severe abdominal pain.  You cannot drink fluids without vomiting.  You develop pain in a different area of your pelvis.  You have bad-smelling vaginal discharge.  You have a rash. Get help right away if:  You have vaginal bleeding that soaks more than one sanitary pad in 1 hour, for 2 hours in a row.  You pass large blood clots from your vagina.  You have a fever that is above 100.18F (38.0C).  Your abdomen feels very tender or hard.  You have chest pain.  You have shortness of breath.  You cough up blood.  You feel dizzy or light-headed.  You faint.  You have pain in your neck or shoulder area. This information is not intended to replace advice given to you by your health care provider. Make sure you discuss any questions you have with your health care provider. Document Revised: 11/29/2017 Document Reviewed: 07/19/2016 Elsevier Patient Education  [place] Instructions  Activity: Get plenty of rest for the remainder of the day. A responsible individual must stay with you for 24 hours following the procedure.  For the next 24 hours, DO NOT: -Drive a car -Paediatric nurse -Drink alcoholic beverages -Take any medication unless instructed by your physician -Make any legal decisions or sign important papers.  Meals: Start with liquid foods such as gelatin or soup. Progress to regular foods as tolerated. Avoid greasy, spicy, heavy foods. If nausea and/or vomiting occur, drink only clear liquids until the nausea and/or vomiting subsides. Call your physician  if vomiting continues.  Special Instructions/Symptoms: Your throat may feel dry or sore from the anesthesia or the breathing tube placed in your throat during surgery. If this causes discomfort, gargle with warm salt water. The discomfort should disappear within 24 hours.  If you had a scopolamine patch placed behind your ear for the management of post-  operative nausea and/or vomiting:  1. The medication in the patch is effective for 72 hours, after which it should be removed.  Wrap patch in a tissue and discard in the trash. Wash hands thoroughly with soap and water. 2. You may remove the patch earlier than 72 hours if you experience unpleasant side effects which may include dry mouth, dizziness or visual disturbances. 3. Avoid touching the patch. Wash your hands with soap and water after contact with the patch.

## 2020-02-17 NOTE — Transfer of Care (Signed)
Immediate Anesthesia Transfer of Care Note  Patient: Sara [name]  Procedure(s) Performed: DILATATION & CURETTAGE/HYSTEROSCOPY WITH MYOSURE (N/A Vagina )  Patient Location: PACU  Anesthesia Type:General  Level of Consciousness: awake, alert  and oriented  Airway & Oxygen Therapy: Patient Spontanous Breathing and Patient connected to face mask oxygen  Post-op Assessment: Report given to RN and Post -op Vital signs reviewed and stable  Post vital signs: Reviewed and stable  Last Vitals:  Vitals Value Taken Time  BP 105/78   Temp    Pulse 70 02/17/20 1348  Resp 16 02/17/20 1348  SpO2 99 % 02/17/20 1348  Vitals shown include unvalidated device data.  Last Pain:  Vitals:   02/17/20 1104  TempSrc: Oral  PainSc: 0-No pain         Complications: No apparent anesthesia complications

## 2020-02-18 ENCOUNTER — Encounter: Payer: Self-pay | Admitting: *Deleted

## 2020-02-18 LAB — SURGICAL PATHOLOGY

## 2020-02-25 ENCOUNTER — Other Ambulatory Visit: Payer: Self-pay | Admitting: Sports Medicine

## 2020-02-26 ENCOUNTER — Ambulatory Visit: Payer: Medicare PPO

## 2020-02-29 DIAGNOSIS — M47816 Spondylosis without myelopathy or radiculopathy, lumbar region: Secondary | ICD-10-CM | POA: Diagnosis not present

## 2020-02-29 DIAGNOSIS — M542 Cervicalgia: Secondary | ICD-10-CM | POA: Diagnosis not present

## 2020-02-29 DIAGNOSIS — M19011 Primary osteoarthritis, right shoulder: Secondary | ICD-10-CM | POA: Diagnosis not present

## 2020-02-29 DIAGNOSIS — Z9181 History of falling: Secondary | ICD-10-CM | POA: Diagnosis not present

## 2020-02-29 DIAGNOSIS — M4312 Spondylolisthesis, cervical region: Secondary | ICD-10-CM | POA: Diagnosis not present

## 2020-02-29 DIAGNOSIS — M25511 Pain in right shoulder: Secondary | ICD-10-CM | POA: Diagnosis not present

## 2020-02-29 DIAGNOSIS — S14109D Unspecified injury at unspecified level of cervical spinal cord, subsequent encounter: Secondary | ICD-10-CM | POA: Diagnosis not present

## 2020-02-29 DIAGNOSIS — M50322 Other cervical disc degeneration at C5-C6 level: Secondary | ICD-10-CM | POA: Diagnosis not present

## 2020-02-29 DIAGNOSIS — M25512 Pain in left shoulder: Secondary | ICD-10-CM | POA: Diagnosis not present

## 2020-03-01 ENCOUNTER — Ambulatory Visit: Payer: Medicare PPO | Attending: Internal Medicine

## 2020-03-01 DIAGNOSIS — Z23 Encounter for immunization: Secondary | ICD-10-CM | POA: Insufficient documentation

## 2020-03-01 NOTE — Progress Notes (Signed)
   Covid-19 Vaccination Clinic  Name:  Sara [name]    MRN: [id number] DOB: [birth year]  03/01/2020  [name] was observed post Covid-19 immunization for 15 minutes without incident. She was provided with Vaccine Information Sheet and instruction to access the V-Safe system.   [name] was instructed to call 911 with any severe reactions post vaccine: Marland Kitchen Difficulty breathing  . Swelling of face and throat  . A fast heartbeat  . A bad rash all over body  . Dizziness and weakness   Immunizations Administered    Name Date Dose VIS Date Route   Pfizer COVID-19 Vaccine 03/01/2020 12:58 PM 0.3 mL 12/11/2019 Intramuscular   Manufacturer: [place]   Lot: [id number]   [place]: [id number]

## 2020-03-18 ENCOUNTER — Ambulatory Visit (INDEPENDENT_AMBULATORY_CARE_PROVIDER_SITE_OTHER): Payer: Medicare PPO | Admitting: Sports Medicine

## 2020-03-18 ENCOUNTER — Other Ambulatory Visit: Payer: Self-pay | Admitting: Sports Medicine

## 2020-03-18 ENCOUNTER — Encounter: Payer: Self-pay | Admitting: Sports Medicine

## 2020-03-18 ENCOUNTER — Other Ambulatory Visit: Payer: Self-pay

## 2020-03-18 VITALS — BP 138/97 | HR 84 | Temp 98.1°F | Wt 201.0 lb

## 2020-03-18 DIAGNOSIS — Z0000 Encounter for general adult medical examination without abnormal findings: Secondary | ICD-10-CM

## 2020-03-18 DIAGNOSIS — N2889 Other specified disorders of kidney and ureter: Secondary | ICD-10-CM

## 2020-03-18 DIAGNOSIS — E785 Hyperlipidemia, unspecified: Secondary | ICD-10-CM | POA: Diagnosis not present

## 2020-03-18 DIAGNOSIS — E559 Vitamin D deficiency, unspecified: Secondary | ICD-10-CM

## 2020-03-18 DIAGNOSIS — R7303 Prediabetes: Secondary | ICD-10-CM

## 2020-03-18 DIAGNOSIS — M47812 Spondylosis without myelopathy or radiculopathy, cervical region: Secondary | ICD-10-CM

## 2020-03-18 DIAGNOSIS — I10 Essential (primary) hypertension: Secondary | ICD-10-CM

## 2020-03-18 DIAGNOSIS — N62 Hypertrophy of breast: Secondary | ICD-10-CM

## 2020-03-18 DIAGNOSIS — N1832 Chronic kidney disease, stage 3b: Secondary | ICD-10-CM | POA: Diagnosis not present

## 2020-03-18 MED ORDER — EPINEPHRINE 0.3 MG/0.3ML IJ SOAJ: 0.3000 mg | Freq: Once | INTRAMUSCULAR | 0 refills | Status: AC

## 2020-03-18 MED ORDER — POTASSIUM CHLORIDE CRYS ER 20 MEQ PO TBCR: 20.0000 meq | EXTENDED_RELEASE_TABLET | Freq: Every day | ORAL | 3 refills | Status: DC

## 2020-03-18 NOTE — Assessment & Plan Note (Signed)
Rechecking routine labs. 

## 2020-03-18 NOTE — Assessment & Plan Note (Signed)
Bilateral reduction mammoplasty scheduled.

## 2020-03-18 NOTE — Assessment & Plan Note (Signed)
Alda returns, she had a fall with injury to some of her cervical ligaments and a cervical cord contusion. On MRIs in the hospital she did have some evidence of spinal cord T2 edema. This improved considerably, but she continued to have pain in her neck with radiation into the upper shoulders bilaterally. After failure of conservative measures, including gabapentin, tramadol we proceeded with a cervical epidural, unfortunately she did not really report much improvement. She still has significant allodynia and hyperalgesia over both shoulders. She was seen by [facility], she had bilateral subacromial injections also without much improvement. Considering how significant her allodynia and hyperalgesia as this is likely fibromyalgia, before we blame it on this I would like a nerve conduction study and EMG to evaluate for larger fiber neuropathy, and potentially skin biopsy to evaluate for small fiber neuropathy. Referral back to neurology.

## 2020-03-18 NOTE — Assessment & Plan Note (Signed)
Slightly elevated, but we are going to be very judicious with her blood pressure as she did have a fall that ended her with a severe spinal injury with a spinal cord contusion.

## 2020-03-18 NOTE — Progress Notes (Addendum)
Subjective:    CC: Annual Physical Exam  HPI:  This patient is here for their annual physical  I reviewed the past medical history, family history, social history, surgical history, and allergies today and no changes were needed.  Please see the problem list section below in epic for further details.  Past Medical History: Past Medical History:  Diagnosis Date  . Allergic rhinitis   . Anxiety   . Asthma   . Fibromyalgia    [name]  . Hyperlipidemia   . Hypertension   . Insomnia 2000  . Lumbar spondylosis   . PONV (postoperative nausea and vomiting)   . Postmenopausal bleeding   . Vaginal Pap smear, abnormal    Past Surgical History: Past Surgical History:  Procedure Laterality Date  . BREAST BIOPSY  1980   neg  . DILATATION & CURETTAGE/HYSTEROSCOPY WITH MYOSURE N/A 02/17/2020   Procedure: DILATATION & CURETTAGE/HYSTEROSCOPY WITH MYOSURE POLYPECTOMY;  Surgeon: Emily Filbert, MD;  Location: [facility];  Service: Gynecology;  Laterality: N/A;  rep will be here  . TOTAL KNEE ARTHROPLASTY Left 2000, 2015    Dr. Tonita Cong   Social History: Social History   Socioeconomic History  . Marital status: Married    Spouse name: Gwyndolyn Saxon  . Number of children: 4  . Years of education: Not on file  . Highest education level: Master's degree (e.g., MA, MS, MEng, MEd, MSW, MBA)  Occupational History  . 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 Smoking status: Never Smoker  . Smokeless tobacco: Never Used  Substance and Sexual Activity  . Alcohol use: Yes    Comment: Occasional-Last had a drink a month ago  . Drug use: No  . Sexual activity: Not Currently    Partners: Male    Birth control/protection: None, Post-menopausal  Other Topics Concern  . Not on file  Social History Narrative   Married to [place].    Has grown children, daughter in [place].   Walks for exercise.   Caffeine- 1 c daily   Social Determinants of Health   Financial Resource Strain:   . Difficulty of Paying Living Expenses:   Food Insecurity:   . Worried About Charity fundraiser in the Last Year:   . Arboriculturist in the Last Year:   Transportation Needs:   . Film/video editor (Medical):   Marland Kitchen Lack of Transportation (Non-Medical):   Physical Activity:   . Days of Exercise per Week:   . Minutes of Exercise per Session:   Stress:   . Feeling of Stress :   Social Connections:   . Frequency of Communication with Friends and Family:   . Frequency of Social Gatherings with Friends and Family:   . Attends Religious Services:   . Active Member of Clubs or Organizations:   . Attends Archivist Meetings:   Marland Kitchen Marital Status:    Family History: Family History  Problem Relation Age of Onset  . Diabetes Mother   . Heart attack Mother        First MI at age 15  . Diabetes type II Mother   . Coronary artery disease Mother   . Hypertension Father   . Diabetes Father   . Alcohol abuse Father   . Pancreatitis Father   . Asthma Daughter   . Asthma Grandchild    Allergies: Allergies  Allergen Reactions  .  Bee Venom Anaphylaxis   Medications: See med rec.  Review of Systems: No headache, visual changes, nausea, vomiting, diarrhea, constipation, dizziness, abdominal pain, skin rash, fevers, chills, night sweats, swollen lymph nodes, weight loss, chest pain, body aches, joint swelling, muscle aches, shortness of breath, mood changes, visual or auditory hallucinations.  Objective:    General: Well Developed, well nourished, and in no acute distress.  Neuro: Alert and oriented x3, extra-ocular muscles intact, sensation grossly intact. Cranial nerves II through XII are intact, motor, sensory, and coordinative functions are all intact. HEENT: Normocephalic, atraumatic, pupils equal round reactive to light, neck supple, no masses, no lymphadenopathy, thyroid nonpalpable.  Oropharynx, nasopharynx, external ear canals are unremarkable. Skin: Warm and dry, no rashes noted.  Cardiac: Regular rate and rhythm, no murmurs rubs or gallops.  Respiratory: Clear to auscultation bilaterally. Not using accessory muscles, speaking in full sentences.  Abdominal: Soft, nontender, nondistended, positive bowel sounds, no masses, no organomegaly.  Musculoskeletal: Shoulder, elbow, wrist, hip, knee, ankle stable, and with full range of motion.  Impression and Recommendations:    The patient was counselled, risk factors were discussed, anticipatory guidance given.  Annual physical exam Annual physical as above, checking routine labs. Up-to-date on screening measures.  Cervical spondylosis Nubia returns, she had a fall with injury to some of her cervical ligaments and a cervical cord contusion. On MRIs in the hospital she did have some evidence of spinal cord T2 edema. This improved considerably, but she continued to have pain in her neck with radiation into the upper shoulders bilaterally. After failure of conservative measures, including gabapentin, tramadol we proceeded with a cervical epidural, unfortunately she did not really report much improvement. She still has significant allodynia and hyperalgesia over both shoulders. She was seen by [facility], she had bilateral subacromial injections also without much improvement. Considering how significant her allodynia and hyperalgesia as this is likely fibromyalgia, before we blame it on this I would like a nerve conduction study and EMG to evaluate for larger fiber neuropathy, and potentially skin biopsy to evaluate for small fiber neuropathy. Referral back to neurology.  Hyperlipidemia with target LDL less than 100 Rechecking routine labs.  Hypertension Slightly elevated, but we are going to be very judicious with her blood pressure as she did have a fall that ended her with a severe spinal injury with a spinal cord  contusion.  Macromastia Bilateral reduction mammoplasty scheduled.  Prediabetes Rechecking hemoglobin A1c.  Chronic renal insufficiency Nicollette's labs look good with the exception of her renal function which is now slightly abnormal, I would like her to hydrate aggressively, stop her Voltaren pills, topical is okay, and then have her renal function rechecked in the lab in 2 weeks.   ___________________________________________ [name]. [name], M.D., ABFM., CAQSM. Primary Care and Sports Medicine [facility]  Adjunct Professor of [place] of [facility] [employer]

## 2020-03-18 NOTE — Assessment & Plan Note (Signed)
Rechecking hemoglobin A1c 

## 2020-03-18 NOTE — Assessment & Plan Note (Signed)
Annual physical as above, checking routine labs. Up-to-date on screening measures.

## 2020-03-19 ENCOUNTER — Other Ambulatory Visit: Payer: Self-pay

## 2020-03-19 DIAGNOSIS — N189 Chronic kidney disease, unspecified: Secondary | ICD-10-CM | POA: Insufficient documentation

## 2020-03-19 DIAGNOSIS — G4700 Insomnia, unspecified: Secondary | ICD-10-CM

## 2020-03-19 DIAGNOSIS — N289 Disorder of kidney and ureter, unspecified: Secondary | ICD-10-CM | POA: Insufficient documentation

## 2020-03-19 LAB — CBC
HCT: 38 % (ref 35.0–45.0)
Hemoglobin: 12.7 g/dL (ref 11.7–15.5)
MCH: 32.8 pg (ref 27.0–33.0)
MCHC: 33.4 g/dL (ref 32.0–36.0)
MCV: 98.2 fL (ref 80.0–100.0)
MPV: 9.1 fL (ref 7.5–12.5)
Platelets: 329 10*3/uL (ref 140–400)
RBC: 3.87 10*6/uL (ref 3.80–5.10)
RDW: 12.4 % (ref 11.0–15.0)
WBC: 8.9 10*3/uL (ref 3.8–10.8)

## 2020-03-19 LAB — COMPLETE METABOLIC PANEL WITH GFR
AG Ratio: 1.7 (calc) (ref 1.0–2.5)
ALT: 11 U/L (ref 6–29)
AST: 12 U/L (ref 10–35)
Albumin: 4.3 g/dL (ref 3.6–5.1)
Alkaline phosphatase (APISO): 49 U/L (ref 37–153)
BUN/Creatinine Ratio: 26 (calc) — ABNORMAL HIGH (ref 6–22)
BUN: 37 mg/dL — ABNORMAL HIGH (ref 7–25)
CO2: 28 mmol/L (ref 20–32)
Calcium: 9.8 mg/dL (ref 8.6–10.4)
Chloride: 106 mmol/L (ref 98–110)
Creat: 1.42 mg/dL — ABNORMAL HIGH (ref 0.50–0.99)
GFR, Est African American: 44 mL/min/{1.73_m2} — ABNORMAL LOW (ref >=60)
GFR, Est Non African American: 38 mL/min/{1.73_m2} — ABNORMAL LOW (ref >=60)
Globulin: 2.5 g/dL (calc) (ref 1.9–3.7)
Glucose, Bld: 83 mg/dL (ref 65–99)
Potassium: 4.4 mmol/L (ref 3.5–5.3)
Sodium: 144 mmol/L (ref 135–146)
Total Bilirubin: 0.9 mg/dL (ref 0.2–1.2)
Total Protein: 6.8 g/dL (ref 6.1–8.1)

## 2020-03-19 LAB — LIPID PANEL W/REFLEX DIRECT LDL
Cholesterol: 171 mg/dL (ref <200)
HDL: 68 mg/dL (ref >=50)
LDL Cholesterol (Calc): 89 mg/dL (calc)
Non-HDL Cholesterol (Calc): 103 mg/dL (calc) (ref <130)
Total CHOL/HDL Ratio: 2.5 (calc) (ref <5.0)
Triglycerides: 56 mg/dL (ref <150)

## 2020-03-19 LAB — HEMOGLOBIN A1C
Hgb A1c MFr Bld: 5.2 % of total Hgb (ref <5.7)
Mean Plasma Glucose: 103 (calc)
eAG (mmol/L): 5.7 (calc)

## 2020-03-19 LAB — TSH: TSH: 1.87 mIU/L (ref 0.40–4.50)

## 2020-03-19 LAB — VITAMIN D 25 HYDROXY (VIT D DEFICIENCY, FRACTURES): Vit D, 25-Hydroxy: 31 ng/mL (ref 30–100)

## 2020-03-19 NOTE — Addendum Note (Signed)
Addended by: Silverio Decamp on: 03/19/2020 09:56 AM   Modules accepted: Orders

## 2020-03-19 NOTE — Assessment & Plan Note (Signed)
Sara [name]'s labs look good with the exception of her renal function which is now slightly abnormal, I would like her to hydrate aggressively, stop her Voltaren pills, topical is okay, and then have her renal function rechecked in the lab in 2 weeks.

## 2020-03-21 MED ORDER — ZOLPIDEM TARTRATE 10 MG PO TABS: 10.0000 mg | ORAL_TABLET | Freq: Every day | ORAL | 0 refills | Status: DC

## 2020-03-23 ENCOUNTER — Encounter: Payer: Self-pay | Admitting: Sports Medicine

## 2020-03-23 DIAGNOSIS — G4733 Obstructive sleep apnea (adult) (pediatric): Secondary | ICD-10-CM | POA: Insufficient documentation

## 2020-03-28 ENCOUNTER — Other Ambulatory Visit: Payer: Self-pay

## 2020-03-28 ENCOUNTER — Encounter: Payer: Self-pay | Admitting: Obstetrics & Gynecology

## 2020-03-28 ENCOUNTER — Ambulatory Visit: Payer: Medicare PPO | Admitting: Sports Medicine

## 2020-03-28 ENCOUNTER — Ambulatory Visit (INDEPENDENT_AMBULATORY_CARE_PROVIDER_SITE_OTHER): Payer: Medicare PPO | Admitting: Obstetrics & Gynecology

## 2020-03-28 VITALS — BP 123/78 | HR 68 | Temp 98.2°F | Ht 64.0 in | Wt 199.0 lb

## 2020-03-28 DIAGNOSIS — N1832 Chronic kidney disease, stage 3b: Secondary | ICD-10-CM | POA: Diagnosis not present

## 2020-03-28 DIAGNOSIS — M47812 Spondylosis without myelopathy or radiculopathy, cervical region: Secondary | ICD-10-CM | POA: Diagnosis not present

## 2020-03-28 DIAGNOSIS — N2889 Other specified disorders of kidney and ureter: Secondary | ICD-10-CM

## 2020-03-28 DIAGNOSIS — F418 Other specified anxiety disorders: Secondary | ICD-10-CM | POA: Diagnosis not present

## 2020-03-28 DIAGNOSIS — Z9889 Other specified postprocedural states: Secondary | ICD-10-CM

## 2020-03-28 MED ORDER — SERTRALINE HCL 25 MG PO TABS: ORAL_TABLET | ORAL | 2 refills | Status: DC

## 2020-03-28 MED ORDER — ONDANSETRON 8 MG PO TBDP: 8.0000 mg | ORAL_TABLET | Freq: Three times a day (TID) | ORAL | 3 refills | Status: DC | PRN

## 2020-03-28 MED ORDER — DULOXETINE HCL 30 MG PO CPEP: 30.0000 mg | ORAL_CAPSULE | Freq: Every day | ORAL | 3 refills | Status: DC

## 2020-03-28 NOTE — Assessment & Plan Note (Signed)
Sara [name] returns, she has been treated aggressively so far, she had a fall sometime ago with injury of her cervical ligaments and a spinal cord contusion. This improved considerably but she continued to have pain in her neck with radiation to the upper shoulders bilaterally. She has improved slightly with gabapentin, tramadol, unfortunately a cervical epidural was not effective, not even temporarily. She did have some pain of her both shoulders and was seen by [facility], she had bilateral subacromial injections without any improvement. She does have some degenerative processes in her neck but nothing surgical, she also likely has an element of fibromyalgia. I think some of this will improve with her breast reduction surgery scheduled in May. Her nerve conduction and EMG is scheduled in April. But we do need to control her pain in the meantime, she will continue tramadol, gabapentin, occasional Zanaflex. Because of renal insufficiency we did discontinue her NSAIDs. I am also going to go ahead and switch her from sertraline to Cymbalta. Return to see me after being on Cymbalta for a solid 6 weeks to reevaluate her discomfort.

## 2020-03-28 NOTE — Progress Notes (Signed)
   Subjective:    Patient ID: Sara [name], female    DOB: April 06, 1951, 69 y.o.   MRN: [id number]  HPI 69 yo lady here for a post op visit. She had a d&c, myosure with polyp resection on 02/17/20. She reports bloody mucous discharge for about 2 weeks. She denies any fevers or other problems.   Review of Systems Pathology was benign polyp. Pap smear normal 10/20    Objective:   Physical Exam Breathing, conversing, and ambulating normally Abd- benign       Assessment & Plan:  Post op- doing well

## 2020-03-28 NOTE — Progress Notes (Addendum)
    Procedures performed today:    None.  Independent interpretation of notes and tests performed by another provider:   None.  Brief History, Exam, Impression, and Recommendations:    Cervical spondylosis with fibromyalgia Sara [name] returns, she has been treated aggressively so far, she had a fall sometime ago with injury of her cervical ligaments and a spinal cord contusion. This improved considerably but she continued to have pain in her neck with radiation to the upper shoulders bilaterally. She has improved slightly with gabapentin, tramadol, unfortunately a cervical epidural was not effective, not even temporarily. She did have some pain of her both shoulders and was seen by [facility], she had bilateral subacromial injections without any improvement. She does have some degenerative processes in her neck but nothing surgical, she also likely has an element of fibromyalgia. I think some of this will improve with her breast reduction surgery scheduled in May. Her nerve conduction and EMG is scheduled in April. But we do need to control her pain in the meantime, she will continue tramadol, gabapentin, occasional Zanaflex. Because of renal insufficiency we did discontinue her NSAIDs. I am also going to go ahead and switch her from sertraline to Cymbalta. Return to see me after being on Cymbalta for a solid 6 weeks to reevaluate her discomfort.  Depression with anxiety Overall doing well on sertraline,Because we do need serotonin and norepinephrine reuptake inhibition I am going to down taper her and switch her to Cymbalta.   Chronic renal insufficiency Acute on chronic renal insufficiency, BUN/creatinine ratio was greater than 20 at the time so we stopped her Voltaren, meloxicam, she hydrated aggressively, recheck renal function panel today.  Renal function has slightly worsened in spite of discontinuing NSAIDs, she will continue to hydrate aggressively, recheck again in a month,  adding a renal ultrasound.  Renal ultrasound is normal, there is thus no evidence of a post renal, or intrarenal cause of the worsening renal function.  Her labs did suggest a prerenal cause, i.e. dehydration. Treatment from here now will be aggressive hydration, close monitoring of the renal function and avoiding renal toxic drugs such as NSAIDs, as well as keeping well-hydrated.  I would like to check her renal function again in 1 month.    ___________________________________________ [name]. [name], M.D., ABFM., CAQSM. Primary Care and [place] Instructor of [place] [employer] [facility] [employer]

## 2020-03-28 NOTE — Assessment & Plan Note (Signed)
Overall doing well on sertraline,Because we do need serotonin and norepinephrine reuptake inhibition I am going to down taper her and switch her to Cymbalta.

## 2020-03-28 NOTE — Assessment & Plan Note (Addendum)
Acute on chronic renal insufficiency, BUN/creatinine ratio was greater than 20 at the time so we stopped her Voltaren, meloxicam, she hydrated aggressively, recheck renal function panel today.  Renal function has slightly worsened in spite of discontinuing NSAIDs, she will continue to hydrate aggressively, recheck again in a month, adding a renal ultrasound.  Renal ultrasound is normal, there is thus no evidence of a post renal, or intrarenal cause of the worsening renal function.  Her labs did suggest a prerenal cause, i.e. dehydration. Treatment from here now will be aggressive hydration, close monitoring of the renal function and avoiding renal toxic drugs such as NSAIDs, as well as keeping well-hydrated.  I would like to check her renal function again in 1 month.

## 2020-03-29 ENCOUNTER — Other Ambulatory Visit: Payer: Self-pay | Admitting: Sports Medicine

## 2020-03-29 DIAGNOSIS — F418 Other specified anxiety disorders: Secondary | ICD-10-CM

## 2020-03-29 DIAGNOSIS — M26623 Arthralgia of bilateral temporomandibular joint: Secondary | ICD-10-CM

## 2020-03-29 DIAGNOSIS — M47812 Spondylosis without myelopathy or radiculopathy, cervical region: Secondary | ICD-10-CM

## 2020-03-29 DIAGNOSIS — N1832 Chronic kidney disease, stage 3b: Secondary | ICD-10-CM

## 2020-03-29 LAB — RENAL FUNCTION PANEL
Albumin: 4.4 g/dL (ref 3.6–5.1)
BUN/Creatinine Ratio: 23 (calc) — ABNORMAL HIGH (ref 6–22)
BUN: 35 mg/dL — ABNORMAL HIGH (ref 7–25)
CO2: 30 mmol/L (ref 20–32)
Calcium: 9.9 mg/dL (ref 8.6–10.4)
Chloride: 104 mmol/L (ref 98–110)
Creat: 1.5 mg/dL — ABNORMAL HIGH (ref 0.50–0.99)
Glucose, Bld: 101 mg/dL — ABNORMAL HIGH (ref 65–99)
Phosphorus: 4.5 mg/dL — ABNORMAL HIGH (ref 2.1–4.3)
Potassium: 4.8 mmol/L (ref 3.5–5.3)
Sodium: 142 mmol/L (ref 135–146)

## 2020-03-29 NOTE — Progress Notes (Signed)
I have placed future orders for this patient to get labs in 4 weeks per PCP.  No other questions.

## 2020-03-29 NOTE — Addendum Note (Signed)
Addended by: Silverio Decamp on: 03/29/2020 08:29 AM   Modules accepted: Orders

## 2020-03-30 ENCOUNTER — Other Ambulatory Visit: Payer: Self-pay

## 2020-03-30 ENCOUNTER — Ambulatory Visit (INDEPENDENT_AMBULATORY_CARE_PROVIDER_SITE_OTHER): Payer: Medicare PPO

## 2020-03-30 DIAGNOSIS — N1832 Chronic kidney disease, stage 3b: Secondary | ICD-10-CM

## 2020-03-30 DIAGNOSIS — N189 Chronic kidney disease, unspecified: Secondary | ICD-10-CM | POA: Diagnosis not present

## 2020-03-30 NOTE — Addendum Note (Signed)
Addended by: Silverio Decamp on: 03/30/2020 03:42 PM   Modules accepted: Orders

## 2020-04-05 ENCOUNTER — Other Ambulatory Visit: Payer: Self-pay | Admitting: Sports Medicine

## 2020-04-09 ENCOUNTER — Other Ambulatory Visit: Payer: Self-pay | Admitting: Sports Medicine

## 2020-04-14 DIAGNOSIS — K6289 Other specified diseases of anus and rectum: Secondary | ICD-10-CM | POA: Diagnosis not present

## 2020-04-14 DIAGNOSIS — K5900 Constipation, unspecified: Secondary | ICD-10-CM | POA: Diagnosis not present

## 2020-04-14 DIAGNOSIS — K219 Gastro-esophageal reflux disease without esophagitis: Secondary | ICD-10-CM | POA: Diagnosis not present

## 2020-04-14 DIAGNOSIS — R194 Change in bowel habit: Secondary | ICD-10-CM | POA: Diagnosis not present

## 2020-04-19 ENCOUNTER — Other Ambulatory Visit: Payer: Self-pay | Admitting: Sports Medicine

## 2020-04-19 DIAGNOSIS — R194 Change in bowel habit: Secondary | ICD-10-CM | POA: Diagnosis not present

## 2020-04-19 DIAGNOSIS — K648 Other hemorrhoids: Secondary | ICD-10-CM | POA: Diagnosis not present

## 2020-04-19 DIAGNOSIS — K6289 Other specified diseases of anus and rectum: Secondary | ICD-10-CM | POA: Diagnosis not present

## 2020-04-19 DIAGNOSIS — F418 Other specified anxiety disorders: Secondary | ICD-10-CM

## 2020-04-19 DIAGNOSIS — K5730 Diverticulosis of large intestine without perforation or abscess without bleeding: Secondary | ICD-10-CM | POA: Diagnosis not present

## 2020-04-19 LAB — HM COLONOSCOPY

## 2020-04-20 ENCOUNTER — Encounter: Payer: Self-pay | Admitting: Sports Medicine

## 2020-04-21 ENCOUNTER — Encounter: Payer: Medicare PPO | Admitting: Diagnostic Neuroimaging

## 2020-04-21 ENCOUNTER — Ambulatory Visit (INDEPENDENT_AMBULATORY_CARE_PROVIDER_SITE_OTHER): Payer: Medicare PPO | Admitting: Diagnostic Neuroimaging

## 2020-04-21 ENCOUNTER — Other Ambulatory Visit: Payer: Self-pay

## 2020-04-21 DIAGNOSIS — M79601 Pain in right arm: Secondary | ICD-10-CM | POA: Diagnosis not present

## 2020-04-21 DIAGNOSIS — M79602 Pain in left arm: Secondary | ICD-10-CM

## 2020-04-21 DIAGNOSIS — Z0289 Encounter for other administrative examinations: Secondary | ICD-10-CM

## 2020-04-21 DIAGNOSIS — F418 Other specified anxiety disorders: Secondary | ICD-10-CM

## 2020-04-21 NOTE — Procedures (Signed)
[facility]  NCS (NERVE CONDUCTION STUDY) WITH EMG (ELECTROMYOGRAPHY) REPORT   STUDY DATE: 04/21/20 PATIENT NAME: Sara [name] DOB: 20-Mar-1951 MRN: [id number]  ORDERING CLINICIAN: Silverio Decamp,*   TECHNOLOGIST: Sherre Scarlet ELECTROMYOGRAPHER: Earlean Polka. Janelle Culton, MD  CLINICAL INFORMATION: 69 year old female with neck pain and arm cramps.  FINDINGS: NERVE CONDUCTION STUDY: Bilateral median and ulnar motor responses are normal.  Bilateral median and ulnar sensory responses are normal.  Bilateral median to ulnar transcarpal mixed nerve comparison study is normal.  Bilateral ulnar F-wave latencies are normal.   NEEDLE ELECTROMYOGRAPHY:  Needle examination of right upper extremity and right cervical paraspinal muscles normal.   IMPRESSION:   Normal study.  No electrodiagnostic evidence of large fiber neuropathy or right cervical radiculopathies at this time.   INTERPRETING PHYSICIAN:  Penni Bombard, MD Certified in Neurology, Neurophysiology and Neuroimaging  [facility] [address], [place], [place] [address] [phone]     [facility]    Nerve / Sites Muscle Latency Ref. Amplitude Ref. Rel Amp Segments Distance Velocity Ref. Area    ms ms mV mV %  cm m/s m/s mVms  L Median - APB     Wrist APB 3.7 ?4.4 8.9 ?4.0 100 Wrist - APB 7   38.5     Upper arm APB 7.6  9.4  106 Upper arm - Wrist 20 51 ?49 41.6  R Median - APB     Wrist APB 3.6 ?4.4 9.1 ?4.0 100 Wrist - APB 7   35.0     Upper arm APB 7.7  8.6  94.8 Upper arm - Wrist 21 51 ?49 33.8  L Ulnar - ADM     Wrist ADM 2.3 ?3.3 12.2 ?6.0 100 Wrist - ADM 7   29.7     B.Elbow ADM 5.7  11.0  89.9 B.Elbow - Wrist 19 56 ?49 27.9     A.Elbow ADM 7.5  10.6  96 A.Elbow - B.Elbow 10 58 ?49 27.8         A.Elbow - Wrist      R Ulnar - ADM     Wrist ADM 2.4 ?3.3 11.7 ?6.0 100 Wrist - ADM 7   31.4     B.Elbow ADM 6.2  9.9  84.7 B.Elbow - Wrist 20 53 ?49 29.3      A.Elbow ADM 7.9  9.8  98.9 A.Elbow - B.Elbow 10 59 ?49 29.2         A.Elbow - Wrist                 SNC    Nerve / Sites Rec. Site Peak Lat Ref.  Amp Ref. Segments Distance Peak Diff Ref.    ms ms V V  cm ms ms  L Median, Ulnar - Transcarpal comparison     Median Palm Wrist 2.1 ?2.2 56 ?35 Median Palm - Wrist 8       Ulnar Palm Wrist 2.1 ?2.2 32 ?12 Ulnar Palm - Wrist 8          Median Palm - Ulnar Palm  0.0 ?0.4  R Median, Ulnar - Transcarpal comparison     Median Palm Wrist 2.3 ?2.2 75 ?35 Median Palm - Wrist 8       Ulnar Palm Wrist 2.0 ?2.2 17 ?12 Ulnar Palm - Wrist 8          Median Palm - Ulnar Palm  0.3 ?0.4  L Median - Orthodromic (  Dig II, Mid palm)     Dig II Wrist 3.3 ?3.4 10 ?10 Dig II - Wrist 13    R Median - Orthodromic (Dig II, Mid palm)     Dig II Wrist 3.4 ?3.4 10 ?10 Dig II - Wrist 13    L Ulnar - Orthodromic, (Dig V, Mid palm)     Dig V Wrist 3.0 ?3.1 7 ?5 Dig V - Wrist 11    R Ulnar - Orthodromic, (Dig V, Mid palm)     Dig V Wrist 3.1 ?3.1 5 ?5 Dig V - Wrist 62                   F  Wave    Nerve F Lat Ref.   ms ms  L Ulnar - ADM 27.8 ?32.0  R Ulnar - ADM 29.4 ?32.0         EMG Summary Table    Spontaneous MUAP Recruitment  Muscle IA Fib PSW Fasc Other Amp Dur. Poly Pattern  R. Deltoid Normal None None None _______ Normal Normal Normal Normal  R. Biceps brachii Normal None None None _______ Normal Normal Normal Normal  R. Triceps brachii Normal None None None _______ Normal Normal Normal Normal  R. Flexor carpi radialis Normal None None None _______ Normal Normal Normal Normal  R. First dorsal interosseous Normal None None None _______ Normal Normal Normal Normal  R. Cervical paraspinals Normal None None None _______ Normal Normal Normal Normal

## 2020-04-22 MED ORDER — AMBULATORY NON FORMULARY MEDICATION: 0 refills | Status: DC

## 2020-04-22 NOTE — Telephone Encounter (Signed)
Rx for an e-stim unit place in box.  Please contact the rep Ms. Larene Beach Doctor at [phone] to get the patient set up.

## 2020-04-24 ENCOUNTER — Other Ambulatory Visit: Payer: Self-pay | Admitting: Sports Medicine

## 2020-04-25 MED ORDER — DIAZEPAM 5 MG PO TABS: 5.0000 mg | ORAL_TABLET | Freq: Every day | ORAL | 0 refills | Status: DC | PRN

## 2020-04-26 ENCOUNTER — Other Ambulatory Visit: Payer: Self-pay | Admitting: Sports Medicine

## 2020-04-26 DIAGNOSIS — G4700 Insomnia, unspecified: Secondary | ICD-10-CM

## 2020-04-26 MED ORDER — METOPROLOL SUCCINATE ER 50 MG PO TB24: ORAL_TABLET | ORAL | 3 refills | Status: DC

## 2020-05-09 ENCOUNTER — Other Ambulatory Visit: Payer: Self-pay | Admitting: Sports Medicine

## 2020-05-09 DIAGNOSIS — M47812 Spondylosis without myelopathy or radiculopathy, cervical region: Secondary | ICD-10-CM

## 2020-05-10 DIAGNOSIS — I10 Essential (primary) hypertension: Secondary | ICD-10-CM | POA: Diagnosis not present

## 2020-05-10 DIAGNOSIS — N6081 Other benign mammary dysplasias of right breast: Secondary | ICD-10-CM | POA: Diagnosis not present

## 2020-05-10 DIAGNOSIS — N6012 Diffuse cystic mastopathy of left breast: Secondary | ICD-10-CM | POA: Diagnosis not present

## 2020-05-10 DIAGNOSIS — N6022 Fibroadenosis of left breast: Secondary | ICD-10-CM | POA: Diagnosis not present

## 2020-05-10 DIAGNOSIS — K219 Gastro-esophageal reflux disease without esophagitis: Secondary | ICD-10-CM | POA: Diagnosis not present

## 2020-05-10 DIAGNOSIS — N62 Hypertrophy of breast: Secondary | ICD-10-CM | POA: Diagnosis not present

## 2020-05-10 DIAGNOSIS — F419 Anxiety disorder, unspecified: Secondary | ICD-10-CM | POA: Diagnosis not present

## 2020-05-10 DIAGNOSIS — M1990 Unspecified osteoarthritis, unspecified site: Secondary | ICD-10-CM | POA: Diagnosis not present

## 2020-05-10 DIAGNOSIS — N6011 Diffuse cystic mastopathy of right breast: Secondary | ICD-10-CM | POA: Diagnosis not present

## 2020-05-10 DIAGNOSIS — N6489 Other specified disorders of breast: Secondary | ICD-10-CM | POA: Diagnosis not present

## 2020-05-10 DIAGNOSIS — M549 Dorsalgia, unspecified: Secondary | ICD-10-CM | POA: Diagnosis not present

## 2020-05-10 DIAGNOSIS — M542 Cervicalgia: Secondary | ICD-10-CM | POA: Diagnosis not present

## 2020-05-10 DIAGNOSIS — E785 Hyperlipidemia, unspecified: Secondary | ICD-10-CM | POA: Diagnosis not present

## 2020-05-11 DIAGNOSIS — M25512 Pain in left shoulder: Secondary | ICD-10-CM | POA: Diagnosis not present

## 2020-05-11 DIAGNOSIS — M25511 Pain in right shoulder: Secondary | ICD-10-CM | POA: Diagnosis not present

## 2020-05-23 ENCOUNTER — Other Ambulatory Visit: Payer: Self-pay | Admitting: Sports Medicine

## 2020-05-23 DIAGNOSIS — F418 Other specified anxiety disorders: Secondary | ICD-10-CM

## 2020-06-01 DIAGNOSIS — M47812 Spondylosis without myelopathy or radiculopathy, cervical region: Secondary | ICD-10-CM

## 2020-06-02 NOTE — Telephone Encounter (Signed)
Referrals pended

## 2020-06-03 DIAGNOSIS — N1832 Chronic kidney disease, stage 3b: Secondary | ICD-10-CM | POA: Diagnosis not present

## 2020-06-03 LAB — RENAL FUNCTION PANEL
Albumin: 4.4 g/dL (ref 3.6–5.1)
BUN/Creatinine Ratio: 29 (calc) — ABNORMAL HIGH (ref 6–22)
BUN: 28 mg/dL — ABNORMAL HIGH (ref 7–25)
CO2: 34 mmol/L — ABNORMAL HIGH (ref 20–32)
Calcium: 10.1 mg/dL (ref 8.6–10.4)
Chloride: 102 mmol/L (ref 98–110)
Creat: 0.98 mg/dL (ref 0.50–0.99)
Glucose, Bld: 100 mg/dL — ABNORMAL HIGH (ref 65–99)
Phosphorus: 2.9 mg/dL (ref 2.1–4.3)
Potassium: 4.5 mmol/L (ref 3.5–5.3)
Sodium: 140 mmol/L (ref 135–146)

## 2020-06-03 LAB — BASIC METABOLIC PANEL WITH GFR
BUN/Creatinine Ratio: 28 (calc) — ABNORMAL HIGH (ref 6–22)
BUN: 28 mg/dL — ABNORMAL HIGH (ref 7–25)
CO2: 35 mmol/L — ABNORMAL HIGH (ref 20–32)
Calcium: 10.2 mg/dL (ref 8.6–10.4)
Chloride: 102 mmol/L (ref 98–110)
Creat: 1.01 mg/dL — ABNORMAL HIGH (ref 0.50–0.99)
GFR, Est African American: 67 mL/min/{1.73_m2} (ref >=60)
GFR, Est Non African American: 58 mL/min/{1.73_m2} — ABNORMAL LOW (ref >=60)
Glucose, Bld: 102 mg/dL — ABNORMAL HIGH (ref 65–99)
Potassium: 4.5 mmol/L (ref 3.5–5.3)
Sodium: 141 mmol/L (ref 135–146)

## 2020-06-03 NOTE — Telephone Encounter (Signed)
Signed, thank you for pending those.

## 2020-06-11 DIAGNOSIS — M25512 Pain in left shoulder: Secondary | ICD-10-CM | POA: Diagnosis not present

## 2020-06-11 DIAGNOSIS — M25511 Pain in right shoulder: Secondary | ICD-10-CM | POA: Diagnosis not present

## 2020-06-20 ENCOUNTER — Other Ambulatory Visit: Payer: Self-pay | Admitting: Sports Medicine

## 2020-06-27 DIAGNOSIS — M4723 Other spondylosis with radiculopathy, cervicothoracic region: Secondary | ICD-10-CM | POA: Diagnosis not present

## 2020-06-27 DIAGNOSIS — M9901 Segmental and somatic dysfunction of cervical region: Secondary | ICD-10-CM | POA: Diagnosis not present

## 2020-06-27 DIAGNOSIS — M9905 Segmental and somatic dysfunction of pelvic region: Secondary | ICD-10-CM | POA: Diagnosis not present

## 2020-06-27 DIAGNOSIS — M47816 Spondylosis without myelopathy or radiculopathy, lumbar region: Secondary | ICD-10-CM | POA: Diagnosis not present

## 2020-06-27 DIAGNOSIS — R293 Abnormal posture: Secondary | ICD-10-CM | POA: Diagnosis not present

## 2020-06-27 DIAGNOSIS — M9903 Segmental and somatic dysfunction of lumbar region: Secondary | ICD-10-CM | POA: Diagnosis not present

## 2020-06-27 DIAGNOSIS — M9902 Segmental and somatic dysfunction of thoracic region: Secondary | ICD-10-CM | POA: Diagnosis not present

## 2020-06-29 DIAGNOSIS — M47816 Spondylosis without myelopathy or radiculopathy, lumbar region: Secondary | ICD-10-CM | POA: Diagnosis not present

## 2020-06-29 DIAGNOSIS — M9905 Segmental and somatic dysfunction of pelvic region: Secondary | ICD-10-CM | POA: Diagnosis not present

## 2020-06-29 DIAGNOSIS — M9902 Segmental and somatic dysfunction of thoracic region: Secondary | ICD-10-CM | POA: Diagnosis not present

## 2020-06-29 DIAGNOSIS — M9901 Segmental and somatic dysfunction of cervical region: Secondary | ICD-10-CM | POA: Diagnosis not present

## 2020-06-29 DIAGNOSIS — R293 Abnormal posture: Secondary | ICD-10-CM | POA: Diagnosis not present

## 2020-06-29 DIAGNOSIS — M9903 Segmental and somatic dysfunction of lumbar region: Secondary | ICD-10-CM | POA: Diagnosis not present

## 2020-06-29 DIAGNOSIS — M4723 Other spondylosis with radiculopathy, cervicothoracic region: Secondary | ICD-10-CM | POA: Diagnosis not present

## 2020-06-30 ENCOUNTER — Ambulatory Visit: Payer: Medicare PPO | Admitting: Sports Medicine

## 2020-06-30 DIAGNOSIS — M160 Bilateral primary osteoarthritis of hip: Secondary | ICD-10-CM | POA: Diagnosis not present

## 2020-06-30 DIAGNOSIS — M47812 Spondylosis without myelopathy or radiculopathy, cervical region: Secondary | ICD-10-CM | POA: Diagnosis not present

## 2020-06-30 MED ORDER — ALL-BODY MASSAGE MISC: 0 refills | Status: DC

## 2020-06-30 NOTE — Progress Notes (Signed)
° ° °  Procedures performed today:    Procedure: Real-time Ultrasound Guided injection of the right hip joint Device: Samsung HS60  Verbal informed consent obtained.  Time-out conducted.  Noted no overlying erythema, induration, or other signs of local infection.  Skin prepped in a sterile fashion.  Local anesthesia: Topical Ethyl chloride.  With sterile technique and under real time ultrasound guidance: 1 cc Kenalog 40, 2 cc lidocaine, 2 cc bupivacaine injected easily Completed without difficulty  Pain immediately resolved suggesting accurate placement of the medication.  Advised to call if fevers/chills, erythema, induration, drainage, or persistent bleeding.  Images permanently stored and available for review in the ultrasound unit.  Impression: Technically successful ultrasound guided injection.  Independent interpretation of notes and tests performed by another provider:   None.  Brief History, Exam, Impression, and Recommendations:    Primary osteoarthritis of both hips Terren returns, she is having an increase in her right hip pain, last injection was in 2019, repeat injection today, return as needed for this.  Cervical spondylosis with fibromyalgia Please see previous note for more information, we are going to add massage therapy to her treatment protocol.    ___________________________________________ [name]. [name], M.D., ABFM., CAQSM. Primary Care and [place] Instructor of [place] of [facility] of Medicine

## 2020-06-30 NOTE — Assessment & Plan Note (Signed)
Please see previous note for more information, we are going to add massage therapy to her treatment protocol.

## 2020-06-30 NOTE — Assessment & Plan Note (Signed)
Sara [name] returns, she is having an increase in her right hip pain, last injection was in 2019, repeat injection today, return as needed for this.

## 2020-07-01 DIAGNOSIS — M47892 Other spondylosis, cervical region: Secondary | ICD-10-CM | POA: Diagnosis not present

## 2020-07-01 DIAGNOSIS — M797 Fibromyalgia: Secondary | ICD-10-CM | POA: Diagnosis not present

## 2020-07-01 DIAGNOSIS — M25511 Pain in right shoulder: Secondary | ICD-10-CM | POA: Diagnosis not present

## 2020-07-01 DIAGNOSIS — S14109D Unspecified injury at unspecified level of cervical spinal cord, subsequent encounter: Secondary | ICD-10-CM | POA: Diagnosis not present

## 2020-07-01 DIAGNOSIS — M25512 Pain in left shoulder: Secondary | ICD-10-CM | POA: Diagnosis not present

## 2020-07-01 DIAGNOSIS — G629 Polyneuropathy, unspecified: Secondary | ICD-10-CM | POA: Diagnosis not present

## 2020-07-11 DIAGNOSIS — M25511 Pain in right shoulder: Secondary | ICD-10-CM | POA: Diagnosis not present

## 2020-07-11 DIAGNOSIS — M25512 Pain in left shoulder: Secondary | ICD-10-CM | POA: Diagnosis not present

## 2020-08-05 DIAGNOSIS — M792 Neuralgia and neuritis, unspecified: Secondary | ICD-10-CM | POA: Diagnosis not present

## 2020-08-05 DIAGNOSIS — M797 Fibromyalgia: Secondary | ICD-10-CM | POA: Diagnosis not present

## 2020-08-05 DIAGNOSIS — M25512 Pain in left shoulder: Secondary | ICD-10-CM | POA: Diagnosis not present

## 2020-08-05 DIAGNOSIS — Z5181 Encounter for therapeutic drug level monitoring: Secondary | ICD-10-CM | POA: Diagnosis not present

## 2020-08-05 DIAGNOSIS — S14109S Unspecified injury at unspecified level of cervical spinal cord, sequela: Secondary | ICD-10-CM | POA: Diagnosis not present

## 2020-08-05 DIAGNOSIS — S14109D Unspecified injury at unspecified level of cervical spinal cord, subsequent encounter: Secondary | ICD-10-CM | POA: Diagnosis not present

## 2020-08-05 DIAGNOSIS — G8929 Other chronic pain: Secondary | ICD-10-CM | POA: Diagnosis not present

## 2020-08-05 DIAGNOSIS — Z79899 Other long term (current) drug therapy: Secondary | ICD-10-CM | POA: Diagnosis not present

## 2020-08-05 DIAGNOSIS — M25511 Pain in right shoulder: Secondary | ICD-10-CM | POA: Diagnosis not present

## 2020-08-08 DIAGNOSIS — D171 Benign lipomatous neoplasm of skin and subcutaneous tissue of trunk: Secondary | ICD-10-CM | POA: Diagnosis not present

## 2020-08-08 DIAGNOSIS — R238 Other skin changes: Secondary | ICD-10-CM | POA: Diagnosis not present

## 2020-08-08 DIAGNOSIS — D179 Benign lipomatous neoplasm, unspecified: Secondary | ICD-10-CM | POA: Diagnosis not present

## 2020-08-11 DIAGNOSIS — M25511 Pain in right shoulder: Secondary | ICD-10-CM | POA: Diagnosis not present

## 2020-08-11 DIAGNOSIS — M25512 Pain in left shoulder: Secondary | ICD-10-CM | POA: Diagnosis not present

## 2020-08-14 DIAGNOSIS — K859 Acute pancreatitis without necrosis or infection, unspecified: Secondary | ICD-10-CM

## 2020-08-14 DIAGNOSIS — Z800 Family history of malignant neoplasm of digestive organs: Secondary | ICD-10-CM

## 2020-08-14 DIAGNOSIS — R1011 Right upper quadrant pain: Secondary | ICD-10-CM

## 2020-08-25 ENCOUNTER — Other Ambulatory Visit: Payer: Self-pay | Admitting: Sports Medicine

## 2020-08-25 DIAGNOSIS — Z1231 Encounter for screening mammogram for malignant neoplasm of breast: Secondary | ICD-10-CM

## 2020-08-31 ENCOUNTER — Ambulatory Visit: Payer: Medicare PPO

## 2020-08-31 ENCOUNTER — Ambulatory Visit (INDEPENDENT_AMBULATORY_CARE_PROVIDER_SITE_OTHER): Payer: Medicare PPO | Admitting: Sports Medicine

## 2020-08-31 DIAGNOSIS — Z23 Encounter for immunization: Secondary | ICD-10-CM

## 2020-09-06 ENCOUNTER — Ambulatory Visit: Payer: Medicare PPO | Admitting: Physician Assistant

## 2020-09-06 ENCOUNTER — Encounter: Payer: Self-pay | Admitting: Physician Assistant

## 2020-09-06 ENCOUNTER — Other Ambulatory Visit: Payer: Self-pay

## 2020-09-06 VITALS — BP 125/75 | HR 79 | Ht 64.0 in | Wt 208.0 lb

## 2020-09-06 DIAGNOSIS — R229 Localized swelling, mass and lump, unspecified: Secondary | ICD-10-CM | POA: Diagnosis not present

## 2020-09-06 DIAGNOSIS — R1012 Left upper quadrant pain: Secondary | ICD-10-CM | POA: Diagnosis not present

## 2020-09-06 DIAGNOSIS — E7889 Other lipoprotein metabolism disorders: Secondary | ICD-10-CM | POA: Insufficient documentation

## 2020-09-06 DIAGNOSIS — R7989 Other specified abnormal findings of blood chemistry: Secondary | ICD-10-CM | POA: Diagnosis not present

## 2020-09-06 DIAGNOSIS — R233 Spontaneous ecchymoses: Secondary | ICD-10-CM | POA: Diagnosis not present

## 2020-09-06 DIAGNOSIS — E882 Lipomatosis, not elsewhere classified: Secondary | ICD-10-CM | POA: Insufficient documentation

## 2020-09-06 MED ORDER — METHYLPREDNISOLONE SODIUM SUCC 125 MG IJ SOLR
125.0000 mg | Freq: Once | INTRAMUSCULAR | Status: AC
  Administered 2020-09-06: 125 mg via INTRAMUSCULAR

## 2020-09-06 NOTE — Progress Notes (Signed)
Subjective:    Patient ID: Sara [name], female    DOB: 16-Mar-1951, 69 y.o.   MRN: [id number]  HPI  Patient is a 69 year old obese female who presents to the clinic with bumps and rash on the left forearm for the last 3 days.  Patient does have a strong history of allergies and even anaphylaxis to bee venom.  She does admit to being out in the yard all day Saturday.  She does not overtly remember any insect or bug bites.  She noticed that the bumps popping up on let arm on Sunday and then darker spots in the middle. Her bumps are a little itchy but also tender as well. She is off mobic and takes daily ASA. She is not taking any allergy medication. She noticed a few intermittent left upper quadrant pains. No nausea,vomiting, fever, chills, body aches. She has ongoing constipation but no diarrhea. No melena or hematochezia. No urinary symptoms.   Pain clinic started her on cymbalta and lyrica a few weeks ago.    .. Active Ambulatory Problems    Diagnosis Date Noted  . GERD (gastroesophageal reflux disease) 02/10/2010  . PMB (postmenopausal bleeding) 02/10/2010  . Left knee revision arthroplasty 02/10/2010  . Stress incontinence 02/10/2010  . Palpitations 07/05/2011  . Asthma with COPD ([place]) 10/14/2011  . Depression with anxiety 07/15/2012  . Hypertension 12/22/2012  . Hyperlipidemia with target LDL less than 100 12/22/2012  . Prediabetes 12/22/2012  . Lumbar spondylosis 01/23/2013  . Allergic rhinitis 06/03/2013  . Annual physical exam 06/03/2013  . Cervical spondylosis with fibromyalgia 12/09/2013  . Primary osteoarthritis of right knee 12/09/2013  . Benign paroxysmal positional vertigo 01/04/2016  . Primary osteoarthritis of both hips 01/04/2016  . Insomnia 02/02/2016  . Primary osteoarthritis, right hand 09/05/2016  . Polyp, cervix 10/29/2018  . Contusion of cervical cord ([place]) 08/10/2019  . Macromastia 12/28/2019  . Chronic renal insufficiency 03/19/2020  . Obstructive  sleep apnea 03/23/2020  . Petechiae 09/06/2020   Resolved Ambulatory Problems    Diagnosis Date Noted  . INFECTIOUS COLITIS ENTERITIS AND GASTROENTERITIS 03/30/2010  . Unspecified vitamin D deficiency 02/10/2010  . LEUKOCYTOSIS 02/13/2010  . ANXIETY 06/20/2010  . Essential hypertension, benign 02/10/2010  . Acute sinusitis, unspecified 06/02/2010  . BACK PAIN 06/20/2010  . FIBROMYALGIA 10/11/2010  . INSOMNIA 02/10/2010  . Abdominal pain, generalized 11/08/2010  . BRONCHITIS, ACUTE 02/10/2011  . Contusion of upper arm 02/21/2011  . Chest pain 07/25/2011  . Bilateral leg edema 07/26/2011  . Hematuria 07/26/2011  . Edema of both legs 09/27/2011  . Situational disturbance 09/27/2011  . Contusion of lower leg, right 09/27/2011  . EUSTACHIAN TUBE DYSFUNCTION, RIGHT 06/03/2011  . UPPER RESPIRATORY INFECTION, ACUTE 10/14/2011  . ALLERGIC RHINITIS 10/14/2011  . Skin lesion of back 01/23/2013  . TMJ arthralgia 09/24/2013  . Hypokalemia 10/05/2013  . Plantar wart, right foot 02/24/2015  . Abnormal weight gain 10/07/2015  . Tinnitus 09/05/2016  . Primary osteoarthritis of right hip 07/10/2017  . Malaise 10/24/2017  . Elevated d-dimer 01/22/2019  . Close exposure to COVID-19 virus 06/09/2019  . Superficial venous thrombosis of arm, right 08/10/2019  . Acute cystitis 12/10/2019  . Viral syndrome 12/10/2019   Past Medical History:  Diagnosis Date  . Anxiety   . Asthma   . Fibromyalgia   . Hyperlipidemia   . PONV (postoperative nausea and vomiting)   . Postmenopausal bleeding   . Vaginal Pap smear, abnormal     Review of Systems See  HPI.     Objective:   Physical Exam Vitals reviewed.  Constitutional:      Appearance: Normal appearance. She is obese.  Cardiovascular:     Rate and Rhythm: Normal rate and regular rhythm.  Pulmonary:     Effort: Pulmonary effort is normal.     Breath sounds: Normal breath sounds. No wheezing.  Abdominal:     General: Bowel sounds are  normal. There is no distension.     Palpations: Abdomen is soft.     Tenderness: There is no abdominal tenderness. There is no right CVA tenderness, left CVA tenderness, guarding or rebound.  Lymphadenopathy:     Cervical: No cervical adenopathy.  Skin:    Comments: Left forearm 3-4 slightly raised mobile erythematous nodules with center area of petechia about 4cm by 2 cm. No swelling, warmth, abrasions, tenderness.   Neurological:     Mental Status: She is alert.  Psychiatric:        Mood and Affect: Mood normal.           Assessment & Plan:  Marland KitchenMarland KitchenZalaya was seen today for rash.  Diagnoses and all orders for this visit:  Petechiae -     CBC w/Diff/Platelet -     methylPREDNISolone sodium succinate (SOLU-MEDROL) 125 mg/2 mL injection 125 mg  Multiple skin nodules -     COMPLETE METABOLIC PANEL WITH GFR -     methylPREDNISolone sodium succinate (SOLU-MEDROL) 125 mg/2 mL injection 125 mg  Elevated serum creatinine -     COMPLETE METABOLIC PANEL WITH GFR  Left upper quadrant pain -     Lipase -     methylPREDNISolone sodium succinate (SOLU-MEDROL) 125 mg/2 mL injection 125 mg   Unclear etiology of symptoms. Most likely bug/insect bites with petechia from scratching. No signs of infection. Pt has hx of allergies and sensitive skin. She does take ASA daily. Will check CMP and CBC. Added lipase due to intermittent LUQ pain. No reflux signs or symptoms. No worrisome bowel changes. Use cool compress over bumps and petechia. Follow up if rash spreading. Solumedrol for inflammation/bug bites/itching. She did just start lyrica and cymbalta may need to consider reaction to medication.   Recent increase in serum creatinine. Recheck kidney function today after going off mobic.

## 2020-09-06 NOTE — Patient Instructions (Addendum)
Get labs.  Cool compresses on left arm.  Call if symptoms changing or worsening.

## 2020-09-07 NOTE — Telephone Encounter (Signed)
This is a good idea Jade, do not forget the possibility of adding a CA 19-9 tumor marker as well to the blood they have drawn.

## 2020-09-07 NOTE — Progress Notes (Signed)
Sara [name],   Your kidney function is MUCH better and in normal range.  WBC normal. Platelets normal and stable from the past.   Your pancreatic enzyme is a little elevated which tells [place] there could be some pancreatic inflammation. I would keep to a clear liquid diet for 24 hours and push fluids and see if that upper abdominal pain does not subside. If continuing to have pain need to get imaging.

## 2020-09-08 NOTE — Telephone Encounter (Signed)
Spoke with radiology and CT ordered. [place].

## 2020-09-08 NOTE — Telephone Encounter (Signed)
CA 19-9 added to labs via [facility] portal Order number: [id number].

## 2020-09-09 LAB — CBC WITH DIFFERENTIAL/PLATELET
Absolute Monocytes: 670 cells/uL (ref 200–950)
Basophils Absolute: 52 cells/uL (ref 0–200)
Basophils Relative: 0.6 %
Eosinophils Absolute: 78 cells/uL (ref 15–500)
Eosinophils Relative: 0.9 %
HCT: 40.7 % (ref 35.0–45.0)
Hemoglobin: 13.7 g/dL (ref 11.7–15.5)
Lymphs Abs: 3428 cells/uL (ref 850–3900)
MCH: 31.5 pg (ref 27.0–33.0)
MCHC: 33.7 g/dL (ref 32.0–36.0)
MCV: 93.6 fL (ref 80.0–100.0)
MPV: 9.9 fL (ref 7.5–12.5)
Monocytes Relative: 7.7 %
Neutro Abs: 4472 cells/uL (ref 1500–7800)
Neutrophils Relative %: 51.4 %
Platelets: 317 10*3/uL (ref 140–400)
RBC: 4.35 10*6/uL (ref 3.80–5.10)
RDW: 13.7 % (ref 11.0–15.0)
Total Lymphocyte: 39.4 %
WBC: 8.7 10*3/uL (ref 3.8–10.8)

## 2020-09-09 LAB — COMPLETE METABOLIC PANEL WITH GFR
AG Ratio: 1.5 (calc) (ref 1.0–2.5)
ALT: 10 U/L (ref 6–29)
AST: 18 U/L (ref 10–35)
Albumin: 4.3 g/dL (ref 3.6–5.1)
Alkaline phosphatase (APISO): 66 U/L (ref 37–153)
BUN: 20 mg/dL (ref 7–25)
CO2: 30 mmol/L (ref 20–32)
Calcium: 9.6 mg/dL (ref 8.6–10.4)
Chloride: 103 mmol/L (ref 98–110)
Creat: 0.87 mg/dL (ref 0.50–0.99)
GFR, Est African American: 80 mL/min/{1.73_m2} (ref >=60)
GFR, Est Non African American: 69 mL/min/{1.73_m2} (ref >=60)
Globulin: 2.8 g/dL (calc) (ref 1.9–3.7)
Glucose, Bld: 106 mg/dL — ABNORMAL HIGH (ref 65–99)
Potassium: 4 mmol/L (ref 3.5–5.3)
Sodium: 140 mmol/L (ref 135–146)
Total Bilirubin: 0.7 mg/dL (ref 0.2–1.2)
Total Protein: 7.1 g/dL (ref 6.1–8.1)

## 2020-09-09 LAB — LIPASE: Lipase: 61 U/L — ABNORMAL HIGH (ref 7–60)

## 2020-09-09 LAB — CANCER ANTIGEN 19-9: CA 19-9: 9 U/mL (ref <34)

## 2020-09-09 NOTE — Progress Notes (Signed)
Reassuring but you cancer antigen for pancreatic cancer is negative.

## 2020-09-11 ENCOUNTER — Other Ambulatory Visit: Payer: Self-pay | Admitting: Sports Medicine

## 2020-09-11 DIAGNOSIS — M25511 Pain in right shoulder: Secondary | ICD-10-CM | POA: Diagnosis not present

## 2020-09-11 DIAGNOSIS — M25512 Pain in left shoulder: Secondary | ICD-10-CM | POA: Diagnosis not present

## 2020-09-11 DIAGNOSIS — G4700 Insomnia, unspecified: Secondary | ICD-10-CM

## 2020-09-12 ENCOUNTER — Other Ambulatory Visit: Payer: Self-pay | Admitting: Sports Medicine

## 2020-09-21 ENCOUNTER — Ambulatory Visit: Payer: Medicare PPO

## 2020-09-26 DIAGNOSIS — M2012 Hallux valgus (acquired), left foot: Secondary | ICD-10-CM | POA: Diagnosis not present

## 2020-09-26 DIAGNOSIS — M2011 Hallux valgus (acquired), right foot: Secondary | ICD-10-CM | POA: Diagnosis not present

## 2020-10-05 ENCOUNTER — Other Ambulatory Visit: Payer: Self-pay | Admitting: Sports Medicine

## 2020-10-05 DIAGNOSIS — F32A Depression, unspecified: Secondary | ICD-10-CM | POA: Diagnosis not present

## 2020-10-05 DIAGNOSIS — M549 Dorsalgia, unspecified: Secondary | ICD-10-CM | POA: Diagnosis not present

## 2020-10-05 DIAGNOSIS — M47812 Spondylosis without myelopathy or radiculopathy, cervical region: Secondary | ICD-10-CM | POA: Diagnosis not present

## 2020-10-05 DIAGNOSIS — M545 Low back pain, unspecified: Secondary | ICD-10-CM | POA: Diagnosis not present

## 2020-10-05 DIAGNOSIS — M5412 Radiculopathy, cervical region: Secondary | ICD-10-CM | POA: Diagnosis not present

## 2020-10-05 DIAGNOSIS — F418 Other specified anxiety disorders: Secondary | ICD-10-CM

## 2020-10-05 DIAGNOSIS — M160 Bilateral primary osteoarthritis of hip: Secondary | ICD-10-CM | POA: Diagnosis not present

## 2020-10-05 DIAGNOSIS — M4722 Other spondylosis with radiculopathy, cervical region: Secondary | ICD-10-CM | POA: Diagnosis not present

## 2020-10-05 DIAGNOSIS — G8929 Other chronic pain: Secondary | ICD-10-CM | POA: Diagnosis not present

## 2020-10-05 DIAGNOSIS — M542 Cervicalgia: Secondary | ICD-10-CM | POA: Diagnosis not present

## 2020-10-05 DIAGNOSIS — F419 Anxiety disorder, unspecified: Secondary | ICD-10-CM | POA: Diagnosis not present

## 2020-10-05 DIAGNOSIS — Z79899 Other long term (current) drug therapy: Secondary | ICD-10-CM | POA: Diagnosis not present

## 2020-10-05 DIAGNOSIS — M4726 Other spondylosis with radiculopathy, lumbar region: Secondary | ICD-10-CM | POA: Diagnosis not present

## 2020-10-05 DIAGNOSIS — M47816 Spondylosis without myelopathy or radiculopathy, lumbar region: Secondary | ICD-10-CM | POA: Diagnosis not present

## 2020-10-11 DIAGNOSIS — M25511 Pain in right shoulder: Secondary | ICD-10-CM | POA: Diagnosis not present

## 2020-10-11 DIAGNOSIS — M25512 Pain in left shoulder: Secondary | ICD-10-CM | POA: Diagnosis not present

## 2020-10-12 ENCOUNTER — Ambulatory Visit (INDEPENDENT_AMBULATORY_CARE_PROVIDER_SITE_OTHER): Payer: Medicare PPO

## 2020-10-12 ENCOUNTER — Other Ambulatory Visit: Payer: Self-pay

## 2020-10-12 DIAGNOSIS — Z1231 Encounter for screening mammogram for malignant neoplasm of breast: Secondary | ICD-10-CM | POA: Diagnosis not present

## 2020-10-17 DIAGNOSIS — M5412 Radiculopathy, cervical region: Secondary | ICD-10-CM | POA: Diagnosis not present

## 2020-10-25 ENCOUNTER — Other Ambulatory Visit: Payer: Self-pay | Admitting: Sports Medicine

## 2020-10-25 DIAGNOSIS — F418 Other specified anxiety disorders: Secondary | ICD-10-CM

## 2020-11-02 DIAGNOSIS — N393 Stress incontinence (female) (male): Secondary | ICD-10-CM | POA: Diagnosis not present

## 2020-11-07 DIAGNOSIS — M47816 Spondylosis without myelopathy or radiculopathy, lumbar region: Secondary | ICD-10-CM | POA: Diagnosis not present

## 2020-11-07 DIAGNOSIS — I10 Essential (primary) hypertension: Secondary | ICD-10-CM

## 2020-11-07 DIAGNOSIS — M4722 Other spondylosis with radiculopathy, cervical region: Secondary | ICD-10-CM | POA: Diagnosis not present

## 2020-11-07 DIAGNOSIS — R29898 Other symptoms and signs involving the musculoskeletal system: Secondary | ICD-10-CM | POA: Diagnosis not present

## 2020-11-07 MED ORDER — LISINOPRIL-HYDROCHLOROTHIAZIDE 20-25 MG PO TABS: 1.0000 | ORAL_TABLET | Freq: Every day | ORAL | 3 refills | Status: DC

## 2020-11-11 DIAGNOSIS — M25512 Pain in left shoulder: Secondary | ICD-10-CM | POA: Diagnosis not present

## 2020-11-11 DIAGNOSIS — M25511 Pain in right shoulder: Secondary | ICD-10-CM | POA: Diagnosis not present

## 2020-11-15 DIAGNOSIS — M47816 Spondylosis without myelopathy or radiculopathy, lumbar region: Secondary | ICD-10-CM | POA: Diagnosis not present

## 2020-11-15 DIAGNOSIS — M4722 Other spondylosis with radiculopathy, cervical region: Secondary | ICD-10-CM | POA: Diagnosis not present

## 2020-11-15 DIAGNOSIS — R29898 Other symptoms and signs involving the musculoskeletal system: Secondary | ICD-10-CM | POA: Diagnosis not present

## 2020-11-17 ENCOUNTER — Other Ambulatory Visit: Payer: Self-pay | Admitting: Sports Medicine

## 2020-11-17 DIAGNOSIS — G8929 Other chronic pain: Secondary | ICD-10-CM | POA: Diagnosis not present

## 2020-11-17 DIAGNOSIS — M5412 Radiculopathy, cervical region: Secondary | ICD-10-CM | POA: Diagnosis not present

## 2020-11-17 DIAGNOSIS — Z9889 Other specified postprocedural states: Secondary | ICD-10-CM | POA: Diagnosis not present

## 2020-11-17 DIAGNOSIS — M542 Cervicalgia: Secondary | ICD-10-CM | POA: Diagnosis not present

## 2020-11-17 DIAGNOSIS — S14109S Unspecified injury at unspecified level of cervical spinal cord, sequela: Secondary | ICD-10-CM | POA: Diagnosis not present

## 2020-11-17 DIAGNOSIS — M7918 Myalgia, other site: Secondary | ICD-10-CM | POA: Diagnosis not present

## 2020-11-18 DIAGNOSIS — Z4689 Encounter for fitting and adjustment of other specified devices: Secondary | ICD-10-CM | POA: Diagnosis not present

## 2020-11-18 DIAGNOSIS — N393 Stress incontinence (female) (male): Secondary | ICD-10-CM | POA: Diagnosis not present

## 2020-11-25 ENCOUNTER — Encounter (INDEPENDENT_AMBULATORY_CARE_PROVIDER_SITE_OTHER): Payer: Self-pay

## 2020-11-29 ENCOUNTER — Other Ambulatory Visit: Payer: Self-pay

## 2020-11-29 ENCOUNTER — Encounter: Payer: Self-pay | Admitting: Sports Medicine

## 2020-11-29 ENCOUNTER — Ambulatory Visit: Payer: Medicare PPO | Admitting: Sports Medicine

## 2020-11-29 DIAGNOSIS — K8500 Idiopathic acute pancreatitis without necrosis or infection: Secondary | ICD-10-CM

## 2020-11-29 DIAGNOSIS — E7889 Other lipoprotein metabolism disorders: Secondary | ICD-10-CM | POA: Diagnosis not present

## 2020-11-29 DIAGNOSIS — K859 Acute pancreatitis without necrosis or infection, unspecified: Secondary | ICD-10-CM | POA: Insufficient documentation

## 2020-11-29 DIAGNOSIS — Z0000 Encounter for general adult medical examination without abnormal findings: Secondary | ICD-10-CM

## 2020-11-29 DIAGNOSIS — E882 Lipomatosis, not elsewhere classified: Secondary | ICD-10-CM

## 2020-11-29 DIAGNOSIS — E785 Hyperlipidemia, unspecified: Secondary | ICD-10-CM | POA: Diagnosis not present

## 2020-11-29 MED ORDER — SHINGRIX 50 MCG/0.5ML IM SUSR: 0.5000 mL | Freq: Once | INTRAMUSCULAR | 0 refills | Status: AC

## 2020-11-29 NOTE — Assessment & Plan Note (Signed)
Recent episode of mild pancreatitis, lipase levels in the 60s. Rechecking. Abdominal pain has improved. She does report some polyarthralgias in the upper arms, working with pain management for this, increasing fatigue. Certainly pancreatitis can cause these vague constitutional symptoms. Because of this I am also going to add TSH, CK levels.

## 2020-11-29 NOTE — Assessment & Plan Note (Signed)
Needs Shingrix, sending to pharmacy.

## 2020-11-29 NOTE — Progress Notes (Signed)
    Procedures performed today:    None.  Independent interpretation of notes and tests performed by another provider:   None.  Brief History, Exam, Impression, and Recommendations:    Annual physical exam Needs Shingrix, sending to pharmacy.  Multiple symmetrical lipomatosis Blood has also noted some subcutaneous nodules, on exam they are on both forearms, well-defined, movable, soft, and it looks like cutaneous lipomatosis multiplex. She was advised no further intervention was needed, we could certainly remove them if they became bothersome. Return as needed for this.  Pancreatitis Recent episode of mild pancreatitis, lipase levels in the 60s. Rechecking. Abdominal pain has improved. She does report some polyarthralgias in the upper arms, working with pain management for this, increasing fatigue. Certainly pancreatitis can cause these vague constitutional symptoms. Because of this I am also going to add TSH, CK levels.    ___________________________________________ [name]. [name], M.D., ABFM., CAQSM. Primary Care and [place] Instructor of [place] of [facility] of Medicine

## 2020-11-29 NOTE — Assessment & Plan Note (Signed)
Blood has also noted some subcutaneous nodules, on exam they are on both forearms, well-defined, movable, soft, and it looks like cutaneous lipomatosis multiplex. She was advised no further intervention was needed, we could certainly remove them if they became bothersome. Return as needed for this.

## 2020-11-30 LAB — COMPREHENSIVE METABOLIC PANEL
AG Ratio: 1.9 (calc) (ref 1.0–2.5)
ALT: 14 U/L (ref 6–29)
AST: 23 U/L (ref 10–35)
Albumin: 4.4 g/dL (ref 3.6–5.1)
Alkaline phosphatase (APISO): 62 U/L (ref 37–153)
BUN/Creatinine Ratio: 20 (calc) (ref 6–22)
BUN: 23 mg/dL (ref 7–25)
CO2: 30 mmol/L (ref 20–32)
Calcium: 9.5 mg/dL (ref 8.6–10.4)
Chloride: 101 mmol/L (ref 98–110)
Creat: 1.13 mg/dL — ABNORMAL HIGH (ref 0.50–0.99)
Globulin: 2.3 g/dL (calc) (ref 1.9–3.7)
Glucose, Bld: 88 mg/dL (ref 65–99)
Potassium: 3.9 mmol/L (ref 3.5–5.3)
Sodium: 139 mmol/L (ref 135–146)
Total Bilirubin: 0.6 mg/dL (ref 0.2–1.2)
Total Protein: 6.7 g/dL (ref 6.1–8.1)

## 2020-11-30 LAB — CBC
HCT: 41.3 % (ref 35.0–45.0)
Hemoglobin: 13.8 g/dL (ref 11.7–15.5)
MCH: 31.7 pg (ref 27.0–33.0)
MCHC: 33.4 g/dL (ref 32.0–36.0)
MCV: 94.7 fL (ref 80.0–100.0)
MPV: 10.1 fL (ref 7.5–12.5)
Platelets: 248 10*3/uL (ref 140–400)
RBC: 4.36 10*6/uL (ref 3.80–5.10)
RDW: 12.3 % (ref 11.0–15.0)
WBC: 6.5 10*3/uL (ref 3.8–10.8)

## 2020-11-30 LAB — LIPASE: Lipase: 29 U/L (ref 7–60)

## 2020-11-30 LAB — TSH: TSH: 1.74 mIU/L (ref 0.40–4.50)

## 2020-11-30 LAB — AMYLASE: Amylase: 41 U/L (ref 21–101)

## 2020-11-30 LAB — CK: Total CK: 99 U/L (ref 29–143)

## 2020-12-01 ENCOUNTER — Other Ambulatory Visit: Payer: Self-pay | Admitting: Sports Medicine

## 2020-12-01 DIAGNOSIS — G4700 Insomnia, unspecified: Secondary | ICD-10-CM

## 2020-12-05 ENCOUNTER — Encounter: Payer: Medicare PPO | Admitting: Sports Medicine

## 2020-12-11 DIAGNOSIS — M25512 Pain in left shoulder: Secondary | ICD-10-CM | POA: Diagnosis not present

## 2020-12-11 DIAGNOSIS — M25511 Pain in right shoulder: Secondary | ICD-10-CM | POA: Diagnosis not present

## 2020-12-16 DIAGNOSIS — N393 Stress incontinence (female) (male): Secondary | ICD-10-CM | POA: Diagnosis not present

## 2020-12-16 DIAGNOSIS — Z4689 Encounter for fitting and adjustment of other specified devices: Secondary | ICD-10-CM | POA: Diagnosis not present

## 2020-12-19 ENCOUNTER — Other Ambulatory Visit: Payer: Self-pay | Admitting: Sports Medicine

## 2020-12-19 DIAGNOSIS — F418 Other specified anxiety disorders: Secondary | ICD-10-CM

## 2020-12-26 DIAGNOSIS — Z961 Presence of intraocular lens: Secondary | ICD-10-CM | POA: Diagnosis not present

## 2020-12-26 DIAGNOSIS — H02724 Madarosis of left upper eyelid and periocular area: Secondary | ICD-10-CM | POA: Diagnosis not present

## 2020-12-26 DIAGNOSIS — H524 Presbyopia: Secondary | ICD-10-CM | POA: Diagnosis not present

## 2020-12-26 DIAGNOSIS — H31002 Unspecified chorioretinal scars, left eye: Secondary | ICD-10-CM | POA: Diagnosis not present

## 2020-12-26 DIAGNOSIS — H52223 Regular astigmatism, bilateral: Secondary | ICD-10-CM | POA: Diagnosis not present

## 2020-12-26 DIAGNOSIS — H02721 Madarosis of right upper eyelid and periocular area: Secondary | ICD-10-CM | POA: Diagnosis not present

## 2021-01-11 DIAGNOSIS — Z20822 Contact with and (suspected) exposure to covid-19: Secondary | ICD-10-CM | POA: Diagnosis not present

## 2021-01-11 DIAGNOSIS — M25512 Pain in left shoulder: Secondary | ICD-10-CM | POA: Diagnosis not present

## 2021-01-11 DIAGNOSIS — M25511 Pain in right shoulder: Secondary | ICD-10-CM | POA: Diagnosis not present

## 2021-01-24 ENCOUNTER — Other Ambulatory Visit: Payer: Self-pay | Admitting: Sports Medicine

## 2021-01-24 DIAGNOSIS — F418 Other specified anxiety disorders: Secondary | ICD-10-CM

## 2021-02-01 DIAGNOSIS — M5412 Radiculopathy, cervical region: Secondary | ICD-10-CM | POA: Diagnosis not present

## 2021-02-01 DIAGNOSIS — G8929 Other chronic pain: Secondary | ICD-10-CM | POA: Diagnosis not present

## 2021-02-01 DIAGNOSIS — S14109S Unspecified injury at unspecified level of cervical spinal cord, sequela: Secondary | ICD-10-CM | POA: Diagnosis not present

## 2021-02-01 DIAGNOSIS — Z79899 Other long term (current) drug therapy: Secondary | ICD-10-CM | POA: Diagnosis not present

## 2021-02-09 ENCOUNTER — Encounter (INDEPENDENT_AMBULATORY_CARE_PROVIDER_SITE_OTHER): Payer: Self-pay

## 2021-02-09 ENCOUNTER — Other Ambulatory Visit: Payer: Self-pay

## 2021-02-09 ENCOUNTER — Ambulatory Visit (INDEPENDENT_AMBULATORY_CARE_PROVIDER_SITE_OTHER): Payer: Medicare PPO | Admitting: Obstetrics and Gynecology

## 2021-02-09 ENCOUNTER — Encounter: Payer: Self-pay | Admitting: Obstetrics and Gynecology

## 2021-02-09 VITALS — BP 114/74 | HR 59 | Ht 64.0 in | Wt 214.0 lb

## 2021-02-09 DIAGNOSIS — Z01419 Encounter for gynecological examination (general) (routine) without abnormal findings: Secondary | ICD-10-CM | POA: Diagnosis not present

## 2021-02-09 NOTE — Patient Instructions (Addendum)
Black Cohosh, Cimicifuga racemosa oral dosage forms What is this medicine? BLACK COHOSH (blak KOH hosh) or Cimicifuga racemosa is a dietary supplement. It is promoted to relieve symptoms of menopause, such as hot flashes. The FDA has not approved this supplement for any medical use. This supplement may be used for other purposes; ask your health care provider or pharmacist if you have questions. This medicine may be used for other purposes; ask your health care provider or pharmacist if you have questions. What should I tell my health care provider before I take this medicine? They need to know if you have any of these conditions:  breast cancer  cervical, ovarian or uterine cancer  high blood pressure  infertility  liver disease  menstrual changes or irregular periods  unusual vaginal or uterine bleeding  an unusual or allergic reaction to black cohosh, soybeans, tartrazine dye (yellow dye number 5), other medicines, foods, dyes, or preservatives  pregnant or trying to get pregnant  breast-feeding How should I use this medicine? Take this herb by mouth with a glass of water. Follow the directions on the package labeling, or talk to your health care professional. Do not use for longer than 6 months without the advice of a health care professional. Do not use if you are pregnant or breast-feeding. Talk to your obstetrician-gynecologist or certified nurse-midwife. This herb is not for use in children under the age of 20 years. Overdosage: If you think you have taken too much of this medicine contact a poison control center or emergency room at once. NOTE: This medicine is only for you. Do not share this medicine with others. What if I miss a dose? If you miss a dose, take it as soon as you can. If it is almost time for your next dose, take only that dose. Do not take double or extra doses. What may interact with this medicine?  atorvastatin  cisplatin  fertility treatments This  list may not describe all possible interactions. Give your health care provider a list of all the medicines, herbs, non-prescription drugs, or dietary supplements you use. Also tell them if you smoke, drink alcohol, or use illegal drugs. Some items may interact with your medicine. What should I watch for while using this medicine? Since this herb is derived from a plant, allergic reactions are possible. Stop using this herb if you develop a rash. You may need to see your health care professional, or inform them that this occurred. Report any unusual side effects promptly. If you are taking this herb for menstrual or menopausal symptoms, visit your doctor or health care professional for regular checks on your progress. You should have a complete check-up every 6 months. You will need a regular breast and pelvic exam while on this therapy. Follow the advice of your doctor or health care professional. Women should inform their doctor if they wish to become pregnant or think they might be pregnant. If you have any reason to think you are pregnant, stop taking this herb at once and contact your doctor or health care professional. Herbal or dietary supplements are not regulated like medicines. Rigid quality control standards are not required for dietary supplements. The purity and strength of these products can vary. The safety and effect of this dietary supplement for a certain disease or illness is not well known. This product is not intended to diagnose, treat, cure or prevent any disease. The Food and Drug Administration suggests the following to help consumers protect themselves:  Always read product labels and follow directions.  Natural does not mean a product is safe for humans to take.  Look for products that include USP after the ingredient name. This means that the manufacturer followed the standards of the U.S. Pharmacopoeia.  Supplements made or sold by a nationally known food or drug company  are more likely to be made under tight controls. You can write to the company for more information about how the product was made. What side effects may I notice from receiving this medicine? Side effects that you should report to your doctor or health care professional as soon as possible:  allergic reactions like skin rash, itching or hives, swelling of the face, lips, or tongue  breathing problems  dizziness  palpitations  signs and symptoms of liver injury like dark yellow or brown urine; general ill feeling or flu-like symptoms; light-colored stools; loss of appetite; nausea; right upper belly pain; unusually weak or tired; yellowing of the eyes or skin  unusual vaginal bleeding Side effects that usually do not require medical attention (report to your doctor or health care professional if they continue or are bothersome):  breast tenderness  headache  nausea  upset stomach This list may not describe all possible side effects. Call your doctor for medical advice about side effects. You may report side effects to FDA at 1-800-FDA-1088. Where should I keep my medicine? Keep out of the reach of children. Store at room temperature between 15 and 30 degrees C (59 and 86 degrees C). Throw away any unused herb after the expiration date. NOTE: This sheet is a summary. It may not cover all possible information. If you have questions about this medicine, talk to your doctor, pharmacist, or health care provider.  2021 Elsevier/Gold Standard (2016-06-27 14:35:09)

## 2021-02-09 NOTE — Progress Notes (Signed)
Subjective:     NONIE LOCHNER is a 70 y.o. female P1 postmenopausal for over 20 years who is here for a comprehensive physical exam. The patient reports no problems. She denies pelvic pain or abnormal vaginal bleeding. She is not sexually active. She denies urinary incontinence. Patient was previously on HRT which was discontinued last year due to uterine polyp and postmenopausal vaginal bleeding. Patient reports return of hot flashes and night sweats.  Past Medical History:  Diagnosis Date  . Allergic rhinitis   . Anxiety   . Asthma   . Fibromyalgia    [name]  . Hyperlipidemia   . Hypertension   . Insomnia 2000  . Lumbar spondylosis   . PONV (postoperative nausea and vomiting)   . Postmenopausal bleeding   . Vaginal Pap smear, abnormal    Past Surgical History:  Procedure Laterality Date  . BREAST BIOPSY  1980   neg  . DILATATION & CURETTAGE/HYSTEROSCOPY WITH MYOSURE N/A 02/17/2020   Procedure: DILATATION & CURETTAGE/HYSTEROSCOPY WITH MYOSURE POLYPECTOMY;  Surgeon: Emily Filbert, MD;  Location: [facility];  Service: Gynecology;  Laterality: N/A;  rep will be here  . REDUCTION MAMMAPLASTY    . TOTAL KNEE ARTHROPLASTY Left 2000, 2015    Dr. Tonita Cong   Family History  Problem Relation Age of Onset  . Diabetes Mother   . Heart attack Mother        First MI at age 29  . Diabetes type II Mother   . Coronary artery disease Mother   . Hypertension Father   . Diabetes Father   . Alcohol abuse Father   . Pancreatitis Father   . Asthma Daughter   . Asthma Grandchild     Social History   Socioeconomic History  . Marital status: Married    Spouse name: Gwyndolyn Saxon  . Number of children: 4  . Years of education: Not on file  . Highest education level: Master's degree (e.g., MA, MS, MEng, MEd, MSW, MBA)  Occupational History  . 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 Smoking  status: Never Smoker  . Smokeless tobacco: Never Used  Vaping Use  . Vaping Use: Never used  Substance and Sexual Activity  . Alcohol use: Yes    Comment: Occasional-Last had a drink a month ago  . Drug use: No  . Sexual activity: Not Currently    Partners: Male    Birth control/protection: None, Post-menopausal  Other Topics Concern  . Not on file  Social History Narrative   Married to [place].    Has grown children, daughter in [place].   Walks for exercise.   Caffeine- 1 c daily   Social Determinants of Health   Financial Resource Strain: Not on file  Food Insecurity: Not on file  Transportation Needs: Not on file  Physical Activity: Not on file  Stress: Not on file  Social Connections: Not on file  Intimate Partner Violence: Not on file   Health Maintenance  Topic Date Due  . COVID-19 Vaccine (3 - Booster for Pfizer series) 09/01/2020  . TETANUS/TDAP  03/04/2022  . MAMMOGRAM  10/12/2022  . COLONOSCOPY (Pts 45-5yrs Insurance coverage will need to be confirmed)  04/19/2030  . INFLUENZA VACCINE  Completed  . DEXA SCAN  Completed  . Hepatitis C Screening  Completed  . PNA vac Low Risk Adult  Completed  Review of Systems Pertinent items noted in HPI and remainder of comprehensive ROS otherwise negative.   Objective:  Blood pressure 114/74, pulse (!) 59, height 5\' 4"  (1.626 m), weight 214 lb (97.1 kg).     GENERAL: Well-developed, well-nourished female in no acute distress.  HEENT: Normocephalic, atraumatic. Sclerae anicteric.  NECK: Supple. Normal thyroid.  LUNGS: Clear to auscultation bilaterally.  HEART: Regular rate and rhythm. BREASTS: Symmetric in size. No palpable masses or lymphadenopathy, skin changes, or nipple drainage. ABDOMEN: Soft, nontender, nondistended. No organomegaly. PELVIC: Normal external female genitalia. Vagina is mildly atrophic, pink and rugated.  Normal discharge. Normal appearing cervix. Uterus is normal in size.  No adnexal mass or  tenderness. EXTREMITIES: No cyanosis, clubbing, or edema, 2+ distal pulses.    Assessment:    Healthy female exam.      Plan:    Pap smear not indicated Normal screening mammogram 09/2020 Discussed the used of over the counter remedies to assists with postmenopausal symptoms. If no improvement in 3-6 months patient may return to discuss HRT.  RTC prn  See After Visit Summary for Counseling Recommendations

## 2021-02-11 DIAGNOSIS — M25512 Pain in left shoulder: Secondary | ICD-10-CM | POA: Diagnosis not present

## 2021-02-11 DIAGNOSIS — M25511 Pain in right shoulder: Secondary | ICD-10-CM | POA: Diagnosis not present

## 2021-02-13 DIAGNOSIS — M5412 Radiculopathy, cervical region: Secondary | ICD-10-CM | POA: Diagnosis not present

## 2021-02-13 DIAGNOSIS — M5413 Radiculopathy, cervicothoracic region: Secondary | ICD-10-CM | POA: Diagnosis not present

## 2021-02-21 DIAGNOSIS — M793 Panniculitis, unspecified: Secondary | ICD-10-CM | POA: Diagnosis not present

## 2021-02-23 DIAGNOSIS — M7918 Myalgia, other site: Secondary | ICD-10-CM | POA: Diagnosis not present

## 2021-02-23 DIAGNOSIS — M4722 Other spondylosis with radiculopathy, cervical region: Secondary | ICD-10-CM | POA: Diagnosis not present

## 2021-02-23 DIAGNOSIS — G8929 Other chronic pain: Secondary | ICD-10-CM | POA: Diagnosis not present

## 2021-02-23 DIAGNOSIS — M436 Torticollis: Secondary | ICD-10-CM | POA: Diagnosis not present

## 2021-02-23 DIAGNOSIS — M545 Low back pain, unspecified: Secondary | ICD-10-CM | POA: Diagnosis not present

## 2021-02-27 ENCOUNTER — Other Ambulatory Visit: Payer: Self-pay

## 2021-02-27 ENCOUNTER — Encounter: Payer: Self-pay | Admitting: Sports Medicine

## 2021-02-27 ENCOUNTER — Ambulatory Visit: Payer: Medicare PPO | Admitting: Sports Medicine

## 2021-02-27 ENCOUNTER — Ambulatory Visit (INDEPENDENT_AMBULATORY_CARE_PROVIDER_SITE_OTHER): Payer: Medicare PPO

## 2021-02-27 DIAGNOSIS — E785 Hyperlipidemia, unspecified: Secondary | ICD-10-CM

## 2021-02-27 DIAGNOSIS — S14109S Unspecified injury at unspecified level of cervical spinal cord, sequela: Secondary | ICD-10-CM

## 2021-02-27 DIAGNOSIS — M160 Bilateral primary osteoarthritis of hip: Secondary | ICD-10-CM | POA: Diagnosis not present

## 2021-02-27 NOTE — Progress Notes (Signed)
    Procedures performed today:    Procedure: Real-time Ultrasound Guided injection of the right hip joint Device: Samsung HS60  Verbal informed consent obtained.  Time-out conducted.  Noted no overlying erythema, induration, or other signs of local infection.  Skin prepped in a sterile fashion.  Local anesthesia: Topical Ethyl chloride.  With sterile technique and under real time ultrasound guidance:  Noted hip osteoarthritis, 1 cc Kenalog 40, 2 cc lidocaine, 2 cc bupivacaine injected easily Completed without difficulty  Advised to call if fevers/chills, erythema, induration, drainage, or persistent bleeding.  Images permanently stored and available for review in PACS.  Impression: Technically successful ultrasound guided injection.  Independent interpretation of notes and tests performed by another provider:   None.  Brief History, Exam, Impression, and Recommendations:    Primary osteoarthritis of both hips Last injection was in July of last year, repeat right hip injection today.  Contusion of cervical cord ([place]) I think there is certainly an element of fibromyalgia as well, she has been working with the pain clinic and symptoms are fairly well controlled with lidocaine cream, Cymbalta, and Lyrica.   Hyperlipidemia with target LDL less than 100 Checking routine labs, she has gained some weight, she will work on a low-carbohydrate low calorie diet.    ___________________________________________ [name]. [name], M.D., ABFM., CAQSM. Primary Care and [place] Instructor of [place] of [facility] of Medicine

## 2021-02-27 NOTE — Assessment & Plan Note (Signed)
Checking routine labs, she has gained some weight, she will work on a low-carbohydrate low calorie diet.

## 2021-02-27 NOTE — Assessment & Plan Note (Signed)
I think there is certainly an element of fibromyalgia as well, she has been working with the pain clinic and symptoms are fairly well controlled with lidocaine cream, Cymbalta, and Lyrica.

## 2021-02-27 NOTE — Assessment & Plan Note (Signed)
Last injection was in July of last year, repeat right hip injection today.

## 2021-02-28 DIAGNOSIS — G8929 Other chronic pain: Secondary | ICD-10-CM | POA: Diagnosis not present

## 2021-02-28 DIAGNOSIS — M542 Cervicalgia: Secondary | ICD-10-CM | POA: Diagnosis not present

## 2021-02-28 DIAGNOSIS — M6281 Muscle weakness (generalized): Secondary | ICD-10-CM | POA: Diagnosis not present

## 2021-02-28 DIAGNOSIS — M436 Torticollis: Secondary | ICD-10-CM | POA: Diagnosis not present

## 2021-02-28 DIAGNOSIS — M545 Low back pain, unspecified: Secondary | ICD-10-CM | POA: Diagnosis not present

## 2021-02-28 LAB — COMPREHENSIVE METABOLIC PANEL
AG Ratio: 1.7 (calc) (ref 1.0–2.5)
ALT: 10 U/L (ref 6–29)
AST: 16 U/L (ref 10–35)
Albumin: 4.3 g/dL (ref 3.6–5.1)
Alkaline phosphatase (APISO): 59 U/L (ref 37–153)
BUN/Creatinine Ratio: 25 (calc) — ABNORMAL HIGH (ref 6–22)
BUN: 26 mg/dL — ABNORMAL HIGH (ref 7–25)
CO2: 31 mmol/L (ref 20–32)
Calcium: 9.6 mg/dL (ref 8.6–10.4)
Chloride: 104 mmol/L (ref 98–110)
Creat: 1.04 mg/dL — ABNORMAL HIGH (ref 0.50–0.99)
Globulin: 2.6 g/dL (calc) (ref 1.9–3.7)
Glucose, Bld: 97 mg/dL (ref 65–99)
Potassium: 4.2 mmol/L (ref 3.5–5.3)
Sodium: 142 mmol/L (ref 135–146)
Total Bilirubin: 0.7 mg/dL (ref 0.2–1.2)
Total Protein: 6.9 g/dL (ref 6.1–8.1)

## 2021-02-28 LAB — CBC
HCT: 38.8 % (ref 35.0–45.0)
Hemoglobin: 12.8 g/dL (ref 11.7–15.5)
MCH: 31.6 pg (ref 27.0–33.0)
MCHC: 33 g/dL (ref 32.0–36.0)
MCV: 95.8 fL (ref 80.0–100.0)
MPV: 9.8 fL (ref 7.5–12.5)
Platelets: 290 10*3/uL (ref 140–400)
RBC: 4.05 10*6/uL (ref 3.80–5.10)
RDW: 13 % (ref 11.0–15.0)
WBC: 6.5 10*3/uL (ref 3.8–10.8)

## 2021-02-28 LAB — LIPID PANEL
Cholesterol: 222 mg/dL — ABNORMAL HIGH (ref <200)
HDL: 81 mg/dL (ref >=50)
LDL Cholesterol (Calc): 124 mg/dL (calc) — ABNORMAL HIGH
Non-HDL Cholesterol (Calc): 141 mg/dL (calc) — ABNORMAL HIGH (ref <130)
Total CHOL/HDL Ratio: 2.7 (calc) (ref <5.0)
Triglycerides: 71 mg/dL (ref <150)

## 2021-02-28 LAB — TSH: TSH: 1.85 mIU/L (ref 0.40–4.50)

## 2021-02-28 LAB — HEMOGLOBIN A1C
Hgb A1c MFr Bld: 5.5 % of total Hgb (ref <5.7)
Mean Plasma Glucose: 111 mg/dL
eAG (mmol/L): 6.2 mmol/L

## 2021-03-02 DIAGNOSIS — M542 Cervicalgia: Secondary | ICD-10-CM | POA: Diagnosis not present

## 2021-03-02 DIAGNOSIS — M436 Torticollis: Secondary | ICD-10-CM | POA: Diagnosis not present

## 2021-03-02 DIAGNOSIS — M6281 Muscle weakness (generalized): Secondary | ICD-10-CM | POA: Diagnosis not present

## 2021-03-02 DIAGNOSIS — G8929 Other chronic pain: Secondary | ICD-10-CM | POA: Diagnosis not present

## 2021-03-02 DIAGNOSIS — M545 Low back pain, unspecified: Secondary | ICD-10-CM | POA: Diagnosis not present

## 2021-03-05 ENCOUNTER — Other Ambulatory Visit: Payer: Self-pay | Admitting: Sports Medicine

## 2021-03-05 DIAGNOSIS — G4700 Insomnia, unspecified: Secondary | ICD-10-CM

## 2021-03-07 DIAGNOSIS — M436 Torticollis: Secondary | ICD-10-CM | POA: Diagnosis not present

## 2021-03-07 DIAGNOSIS — G8929 Other chronic pain: Secondary | ICD-10-CM | POA: Diagnosis not present

## 2021-03-07 DIAGNOSIS — M545 Low back pain, unspecified: Secondary | ICD-10-CM | POA: Diagnosis not present

## 2021-03-07 DIAGNOSIS — M542 Cervicalgia: Secondary | ICD-10-CM | POA: Diagnosis not present

## 2021-03-07 DIAGNOSIS — M6281 Muscle weakness (generalized): Secondary | ICD-10-CM | POA: Diagnosis not present

## 2021-03-09 DIAGNOSIS — M545 Low back pain, unspecified: Secondary | ICD-10-CM | POA: Diagnosis not present

## 2021-03-09 DIAGNOSIS — M436 Torticollis: Secondary | ICD-10-CM | POA: Diagnosis not present

## 2021-03-09 DIAGNOSIS — M542 Cervicalgia: Secondary | ICD-10-CM | POA: Diagnosis not present

## 2021-03-09 DIAGNOSIS — M6281 Muscle weakness (generalized): Secondary | ICD-10-CM | POA: Diagnosis not present

## 2021-03-09 DIAGNOSIS — G8929 Other chronic pain: Secondary | ICD-10-CM | POA: Diagnosis not present

## 2021-03-11 DIAGNOSIS — M25512 Pain in left shoulder: Secondary | ICD-10-CM | POA: Diagnosis not present

## 2021-03-11 DIAGNOSIS — M25511 Pain in right shoulder: Secondary | ICD-10-CM | POA: Diagnosis not present

## 2021-03-13 ENCOUNTER — Other Ambulatory Visit: Payer: Self-pay | Admitting: Sports Medicine

## 2021-03-13 DIAGNOSIS — F418 Other specified anxiety disorders: Secondary | ICD-10-CM

## 2021-03-14 DIAGNOSIS — M542 Cervicalgia: Secondary | ICD-10-CM | POA: Diagnosis not present

## 2021-03-14 DIAGNOSIS — G8929 Other chronic pain: Secondary | ICD-10-CM | POA: Diagnosis not present

## 2021-03-14 DIAGNOSIS — M6281 Muscle weakness (generalized): Secondary | ICD-10-CM | POA: Diagnosis not present

## 2021-03-14 DIAGNOSIS — M545 Low back pain, unspecified: Secondary | ICD-10-CM | POA: Diagnosis not present

## 2021-03-14 DIAGNOSIS — M436 Torticollis: Secondary | ICD-10-CM | POA: Diagnosis not present

## 2021-03-16 DIAGNOSIS — M6281 Muscle weakness (generalized): Secondary | ICD-10-CM | POA: Diagnosis not present

## 2021-03-16 DIAGNOSIS — M436 Torticollis: Secondary | ICD-10-CM | POA: Diagnosis not present

## 2021-03-16 DIAGNOSIS — M545 Low back pain, unspecified: Secondary | ICD-10-CM | POA: Diagnosis not present

## 2021-03-16 DIAGNOSIS — G8929 Other chronic pain: Secondary | ICD-10-CM | POA: Diagnosis not present

## 2021-03-16 DIAGNOSIS — M542 Cervicalgia: Secondary | ICD-10-CM | POA: Diagnosis not present

## 2021-03-20 DIAGNOSIS — M7918 Myalgia, other site: Secondary | ICD-10-CM | POA: Diagnosis not present

## 2021-03-20 DIAGNOSIS — Z79899 Other long term (current) drug therapy: Secondary | ICD-10-CM | POA: Diagnosis not present

## 2021-03-20 DIAGNOSIS — M797 Fibromyalgia: Secondary | ICD-10-CM | POA: Diagnosis not present

## 2021-03-21 DIAGNOSIS — M6281 Muscle weakness (generalized): Secondary | ICD-10-CM | POA: Diagnosis not present

## 2021-03-21 DIAGNOSIS — M545 Low back pain, unspecified: Secondary | ICD-10-CM | POA: Diagnosis not present

## 2021-03-21 DIAGNOSIS — M542 Cervicalgia: Secondary | ICD-10-CM | POA: Diagnosis not present

## 2021-03-21 DIAGNOSIS — M436 Torticollis: Secondary | ICD-10-CM | POA: Diagnosis not present

## 2021-03-21 DIAGNOSIS — G8929 Other chronic pain: Secondary | ICD-10-CM | POA: Diagnosis not present

## 2021-03-23 DIAGNOSIS — M542 Cervicalgia: Secondary | ICD-10-CM | POA: Diagnosis not present

## 2021-03-23 DIAGNOSIS — G8929 Other chronic pain: Secondary | ICD-10-CM | POA: Diagnosis not present

## 2021-03-23 DIAGNOSIS — M6281 Muscle weakness (generalized): Secondary | ICD-10-CM | POA: Diagnosis not present

## 2021-03-23 DIAGNOSIS — M436 Torticollis: Secondary | ICD-10-CM | POA: Diagnosis not present

## 2021-03-23 DIAGNOSIS — M545 Low back pain, unspecified: Secondary | ICD-10-CM | POA: Diagnosis not present

## 2021-03-27 DIAGNOSIS — E785 Hyperlipidemia, unspecified: Secondary | ICD-10-CM

## 2021-03-27 DIAGNOSIS — R61 Generalized hyperhidrosis: Secondary | ICD-10-CM

## 2021-03-27 DIAGNOSIS — R7303 Prediabetes: Secondary | ICD-10-CM

## 2021-03-28 DIAGNOSIS — M436 Torticollis: Secondary | ICD-10-CM | POA: Diagnosis not present

## 2021-03-28 DIAGNOSIS — M6281 Muscle weakness (generalized): Secondary | ICD-10-CM | POA: Diagnosis not present

## 2021-03-28 DIAGNOSIS — M542 Cervicalgia: Secondary | ICD-10-CM | POA: Diagnosis not present

## 2021-03-28 DIAGNOSIS — G8929 Other chronic pain: Secondary | ICD-10-CM | POA: Diagnosis not present

## 2021-03-28 DIAGNOSIS — M545 Low back pain, unspecified: Secondary | ICD-10-CM | POA: Diagnosis not present

## 2021-03-28 DIAGNOSIS — R61 Generalized hyperhidrosis: Secondary | ICD-10-CM | POA: Insufficient documentation

## 2021-03-28 NOTE — Telephone Encounter (Signed)
I spent 5 total minutes of online digital evaluation and management services. 

## 2021-03-29 ENCOUNTER — Telehealth (INDEPENDENT_AMBULATORY_CARE_PROVIDER_SITE_OTHER): Payer: Medicare PPO | Admitting: Sports Medicine

## 2021-03-29 ENCOUNTER — Encounter: Payer: Self-pay | Admitting: Sports Medicine

## 2021-03-29 DIAGNOSIS — M26623 Arthralgia of bilateral temporomandibular joint: Secondary | ICD-10-CM

## 2021-03-29 DIAGNOSIS — R61 Generalized hyperhidrosis: Secondary | ICD-10-CM

## 2021-03-29 DIAGNOSIS — M47812 Spondylosis without myelopathy or radiculopathy, cervical region: Secondary | ICD-10-CM | POA: Diagnosis not present

## 2021-03-29 MED ORDER — TRAMADOL HCL 50 MG PO TABS: 50.0000 mg | ORAL_TABLET | Freq: Two times a day (BID) | ORAL | 3 refills | Status: DC | PRN

## 2021-03-29 MED ORDER — TIZANIDINE HCL 6 MG PO CAPS: 6.0000 mg | ORAL_CAPSULE | Freq: Three times a day (TID) | ORAL | 3 refills | Status: DC

## 2021-03-29 NOTE — Progress Notes (Signed)
   Virtual Visit via WebEx/MyChart   I connected with  Sara [name]  on 03/29/21 via WebEx/MyChart/Doximity Video and verified that I am speaking with the correct person using two identifiers.   I discussed the limitations, risks, security and privacy concerns of performing an evaluation and management service by WebEx/MyChart/Doximity Video, including the higher likelihood of inaccurate diagnosis and treatment, and the availability of in person appointments.  We also discussed the likely need of an additional face to face encounter for complete and high quality delivery of care.  I also discussed with the patient that there may be a patient responsible charge related to this service. The patient expressed understanding and wishes to proceed.  Provider location is in medical facility. Patient location is at their home, different from provider location. People involved in care of the patient during this telehealth encounter were myself, my nurse/medical assistant, and my front office/scheduling team member.  Review of Systems: No fevers, chills, night sweats, weight loss, chest pain, or shortness of breath.   Objective Findings:    General: Speaking full sentences, no audible heavy breathing.  Sounds alert and appropriately interactive.  Appears well.  Face symmetric.  Extraocular movements intact.  Pupils equal and round.  No nasal flaring or accessory muscle use visualized.  Independent interpretation of tests performed by another provider:   None.  Brief History, Exam, Impression, and Recommendations:    Cervical spondylosis with fibromyalgia Sara [name] has a complex history of pain, she does have cervical spondylosis but her presentation is starting to appear more like fibromyalgia. She has significant allodynia and hyperalgesia. She has not responded to gabapentin or Lyrica, she actually did well with tizanidine and tramadol so we will continue this, increasing tizanidine to 6 mg 3  times daily, refilling tramadol to be taken up to twice daily.   Night sweats Ordered labs, I do think this is simply menopausal vasomotor instability. We will await lab results.   I discussed the above assessment and treatment plan with the patient. The patient was provided an opportunity to ask questions and all were answered. The patient agreed with the plan and demonstrated an understanding of the instructions.   The patient was advised to call back or seek an in-person evaluation if the symptoms worsen or if the condition fails to improve as anticipated.   I provided 30 minutes of face to face and non-face-to-face time during this encounter date, time was needed to gather information, review chart, records, communicate/coordinate with staff remotely, as well as complete documentation.   ___________________________________________ [name]. [name], M.D., ABFM., CAQSM. Primary Care and [place] Instructor of [place] [facility]

## 2021-03-29 NOTE — Assessment & Plan Note (Signed)
Ordered labs, I do think this is simply menopausal vasomotor instability. We will await lab results.

## 2021-03-29 NOTE — Assessment & Plan Note (Signed)
Sara [name] has a complex history of pain, she does have cervical spondylosis but her presentation is starting to appear more like fibromyalgia. She has significant allodynia and hyperalgesia. She has not responded to gabapentin or Lyrica, she actually did well with tizanidine and tramadol so we will continue this, increasing tizanidine to 6 mg 3 times daily, refilling tramadol to be taken up to twice daily.

## 2021-03-30 DIAGNOSIS — G8929 Other chronic pain: Secondary | ICD-10-CM | POA: Diagnosis not present

## 2021-03-30 DIAGNOSIS — M542 Cervicalgia: Secondary | ICD-10-CM | POA: Diagnosis not present

## 2021-03-30 DIAGNOSIS — R7303 Prediabetes: Secondary | ICD-10-CM | POA: Diagnosis not present

## 2021-03-30 DIAGNOSIS — M6281 Muscle weakness (generalized): Secondary | ICD-10-CM | POA: Diagnosis not present

## 2021-03-30 DIAGNOSIS — R61 Generalized hyperhidrosis: Secondary | ICD-10-CM | POA: Diagnosis not present

## 2021-03-30 DIAGNOSIS — M436 Torticollis: Secondary | ICD-10-CM | POA: Diagnosis not present

## 2021-03-30 DIAGNOSIS — E785 Hyperlipidemia, unspecified: Secondary | ICD-10-CM | POA: Diagnosis not present

## 2021-03-30 DIAGNOSIS — M545 Low back pain, unspecified: Secondary | ICD-10-CM | POA: Diagnosis not present

## 2021-04-04 ENCOUNTER — Other Ambulatory Visit: Payer: Self-pay | Admitting: Sports Medicine

## 2021-04-04 DIAGNOSIS — M6281 Muscle weakness (generalized): Secondary | ICD-10-CM | POA: Diagnosis not present

## 2021-04-04 DIAGNOSIS — M436 Torticollis: Secondary | ICD-10-CM | POA: Diagnosis not present

## 2021-04-04 DIAGNOSIS — M542 Cervicalgia: Secondary | ICD-10-CM | POA: Diagnosis not present

## 2021-04-04 DIAGNOSIS — M545 Low back pain, unspecified: Secondary | ICD-10-CM | POA: Diagnosis not present

## 2021-04-04 DIAGNOSIS — G8929 Other chronic pain: Secondary | ICD-10-CM | POA: Diagnosis not present

## 2021-04-04 DIAGNOSIS — F418 Other specified anxiety disorders: Secondary | ICD-10-CM

## 2021-04-05 LAB — HEMOGLOBIN A1C
Hgb A1c MFr Bld: 5.5 % of total Hgb (ref <5.7)
Mean Plasma Glucose: 111 mg/dL
eAG (mmol/L): 6.2 mmol/L

## 2021-04-05 LAB — COMPREHENSIVE METABOLIC PANEL
AG Ratio: 1.8 (calc) (ref 1.0–2.5)
ALT: 13 U/L (ref 6–29)
AST: 15 U/L (ref 10–35)
Albumin: 4.7 g/dL (ref 3.6–5.1)
Alkaline phosphatase (APISO): 63 U/L (ref 37–153)
BUN/Creatinine Ratio: 23 (calc) — ABNORMAL HIGH (ref 6–22)
BUN: 24 mg/dL (ref 7–25)
CO2: 29 mmol/L (ref 20–32)
Calcium: 10.2 mg/dL (ref 8.6–10.4)
Chloride: 101 mmol/L (ref 98–110)
Creat: 1.05 mg/dL — ABNORMAL HIGH (ref 0.50–0.99)
Globulin: 2.6 g/dL (calc) (ref 1.9–3.7)
Glucose, Bld: 89 mg/dL (ref 65–99)
Potassium: 4 mmol/L (ref 3.5–5.3)
Sodium: 141 mmol/L (ref 135–146)
Total Bilirubin: 0.7 mg/dL (ref 0.2–1.2)
Total Protein: 7.3 g/dL (ref 6.1–8.1)

## 2021-04-05 LAB — CBC
HCT: 41.7 % (ref 35.0–45.0)
Hemoglobin: 13.9 g/dL (ref 11.7–15.5)
MCH: 31.7 pg (ref 27.0–33.0)
MCHC: 33.3 g/dL (ref 32.0–36.0)
MCV: 95 fL (ref 80.0–100.0)
MPV: 9.5 fL (ref 7.5–12.5)
Platelets: 287 10*3/uL (ref 140–400)
RBC: 4.39 10*6/uL (ref 3.80–5.10)
RDW: 12.5 % (ref 11.0–15.0)
WBC: 8.2 10*3/uL (ref 3.8–10.8)

## 2021-04-05 LAB — TSH: TSH: 2.73 mIU/L (ref 0.40–4.50)

## 2021-04-05 LAB — LIPID PANEL
Cholesterol: 156 mg/dL (ref <200)
HDL: 72 mg/dL (ref >=50)
LDL Cholesterol (Calc): 71 mg/dL (calc)
Non-HDL Cholesterol (Calc): 84 mg/dL (calc) (ref <130)
Total CHOL/HDL Ratio: 2.2 (calc) (ref <5.0)
Triglycerides: 52 mg/dL (ref <150)

## 2021-04-05 LAB — CULTURE, BLOOD (SINGLE): MICRO NUMBER:: 11720881

## 2021-04-05 LAB — QUANTIFERON-TB GOLD PLUS
Mitogen-NIL: >10 IU/mL
NIL: 0.03 IU/mL
QuantiFERON-TB Gold Plus: NEGATIVE
TB1-NIL: 0.01 IU/mL
TB2-NIL: 0 IU/mL

## 2021-04-05 LAB — SEDIMENTATION RATE: Sed Rate: 6 mm/h (ref 0–30)

## 2021-04-11 DIAGNOSIS — M25511 Pain in right shoulder: Secondary | ICD-10-CM | POA: Diagnosis not present

## 2021-04-11 DIAGNOSIS — M25512 Pain in left shoulder: Secondary | ICD-10-CM | POA: Diagnosis not present

## 2021-04-13 DIAGNOSIS — K219 Gastro-esophageal reflux disease without esophagitis: Secondary | ICD-10-CM | POA: Diagnosis not present

## 2021-04-13 DIAGNOSIS — K5900 Constipation, unspecified: Secondary | ICD-10-CM | POA: Diagnosis not present

## 2021-04-13 DIAGNOSIS — R1012 Left upper quadrant pain: Secondary | ICD-10-CM | POA: Diagnosis not present

## 2021-04-17 DIAGNOSIS — K449 Diaphragmatic hernia without obstruction or gangrene: Secondary | ICD-10-CM | POA: Diagnosis not present

## 2021-04-17 DIAGNOSIS — R1310 Dysphagia, unspecified: Secondary | ICD-10-CM | POA: Diagnosis not present

## 2021-04-17 DIAGNOSIS — K3189 Other diseases of stomach and duodenum: Secondary | ICD-10-CM | POA: Diagnosis not present

## 2021-05-08 ENCOUNTER — Other Ambulatory Visit: Payer: Self-pay | Admitting: Sports Medicine

## 2021-05-08 DIAGNOSIS — F418 Other specified anxiety disorders: Secondary | ICD-10-CM

## 2021-05-09 NOTE — Progress Notes (Signed)
New Patient Note  RE: Sara [name] MRN: [id number] DOB: [birth year] Date of Office Visit: 05/10/2021  Consult requested by: Silverio Decamp Primary care provider: Silverio Decamp, MD  Chief Complaint: Nasal Congestion and Cough  History of Present Illness: I had the pleasure of seeing Sara [name] for initial evaluation at the [facility] on 05/10/2021. She is a 70 y.o. female, who is referred here by Silverio Decamp, MD for the evaluation of nasal congestion and cough.  Patient used to see Dr. Shaune Leeks in the past but last visit was over 5 years ago.  Rhinitis: She reports symptoms of nasal congestion, coughing in the mornings, sneezing, rhinorrhea, itchy/watery eyes. Symptoms have been going on for many years but worse the past year. The symptoms are present all year around. Other triggers include exposure to none. Anosmia: no. Headache: sometimes. She has used Singulair, Flonase, benadryl with minimal improvement in symptoms. Sinus infections: no. Previous work up includes: over 5 years ago which was positive to multiple items per patient report. No prior allergy injections. Previous ENT evaluation: yes a few years ago. Previous sinus imaging: no. History of nasal polyps: no. Last eye exam: last year. History of reflux: yes and takes Dexilant daily.  Respiratory: She reports symptoms of chest tightness, shortness of breath, coughing for 20 years. Current medications include albuterol prn with no benefit and it causes palpitations. She reports not using aerochamber with inhalers. She tried the following inhalers: none. Main triggers are unknown. In the last month, frequency of symptoms: 3-4 times per week. Frequency of nocturnal symptoms: 0x/month. Frequency of SABA use: <1x/week. Interference with physical activity: no. Sleep is undisturbed. In the last 12 months, emergency room visits/urgent care visits/doctor office visits or  hospitalizations due to respiratory issues: no. In the last 12 months, oral steroids courses: no. Lifetime history of hospitalization for respiratory issues: no. Prior intubations: no. History of pneumonia: no. She was evaluated by allergist in the past. Smoking exposure: no. Up to date with flu vaccine: yes. Up to date with pneumonia vaccine: yes. Up to date with COVID-19 vaccine: yes. Prior Covid-19 infection: no. Patient has been on lisinopril for over 15 years with no issues.  Assessment and Plan: Sara [name] is a 70 y.o. female with: Other allergic rhinitis Perennial rhinoconjunctivitis symptoms for many years but noticed worsening the past year.  Was allergy tested over 5 years ago which showed multiple positives per patient report.  No prior allergy immunotherapy.  Today's skin testing showed: Positive to grass pollen, ragweed pollen, weed pollen, mold, cat, and borderline to dog.   Start environmental control measures as below.  Take carbinoxamine 4mg  (1 to 1.5 tablet) twice a day as needed.  May use azelastine nasal spray 1-2 sprays per nostril twice a day as needed for runny nose/drainage.  May use Flonase (fluticasone) nasal spray 1 spray per nostril twice a day as needed for nasal congestion.   Nasal saline spray (i.e., Simply Saline) or nasal saline lavage (i.e., NeilMed) is recommended as needed and prior to medicated nasal sprays.  Continue Singulair (montelukast) 10mg  daily at night.  Consider allergy injections for long term control if above medications do not help the symptoms - handout given.   Asthma Diagnosed with asthma over 20 years ago and has weekly coughing, shortness of breath and chest tightness.  Does not like to use albuterol as it causes palpitations.  Today's spirometry was normal.  May use levoalbuterol rescue inhaler 2 puffs  or nebulizer every 4 to 6 hours as needed for shortness of breath, chest tightness, coughing, and wheezing.  Monitor frequency of  use.   Coughing Coughing especially in the mornings.  Patient does take PPI for reflux and is on an ACE inhibitor for hypertension. The most common causes of chronic cough include the following: upper airway cough syndrome (UACS) which is caused by variety of rhinitis conditions; asthma; gastroesophageal reflux disease (GERD); chronic bronchitis from cigarette smoking or other inhaled environmental irritants; non-asthmatic eosinophilic bronchitis; and bronchiectasis.  In prospective studies, these conditions have accounted for up to 94% of the causes of chronic cough in immunocompetent adults.  The history and physical examination suggest that her cough is multifactorial with contribution possibly from reflux and PND.   Heartburn  See below for lifestyle and dietary modifications.  Continue Dexilant as prescribed.  Anaphylaxis due to hymenoptera venom Throat tightness and trouble breathing in the past which required EpiPen use once.  No prior work-up.  Continue avoidance.   For mild symptoms you can take over the counter antihistamines such as Benadryl and monitor symptoms closely. If symptoms worsen or if you have severe symptoms including breathing issues, throat closure, significant swelling, whole body hives, severe diarrhea and vomiting, lightheadedness then inject epinephrine and seek immediate medical care afterwards.  Action plan given. Get bloodwork as below.     Return in about 2 months (around 07/10/2021).  Meds ordered this encounter  Medications  . levalbuterol (XOPENEX HFA) 45 MCG/ACT inhaler    Sig: Inhale 1-2 puffs into the lungs every 4 (four) hours as needed for wheezing or shortness of breath (coughing).    Dispense:  1 each    Refill:  2    Albuterol causes palpitations  . Carbinoxamine Maleate 4 MG TABS    Sig: Take 1 to 1.5 tablet twice a day as needed for allergies    Dispense:  60 tablet    Refill:  3  . azelastine (ASTELIN) 0.1 % nasal spray    Sig:  Place 1-2 sprays into both nostrils 2 (two) times daily as needed (nasal drainage). Use in each nostril as directed    Dispense:  30 mL    Refill:  5    Lab Orders     Tryptase     Allergen Hymenoptera Panel  Other allergy screening: Food allergy: no Medication allergy: no Hymenoptera allergy: yes  Throat tightness and trouble breathing - had to use Epipen once.  Urticaria: no Eczema: yes as a child. History of recurrent infections suggestive of immunodeficency: no  Diagnostics: Spirometry:  Tracings reviewed. Her effort: Good reproducible efforts. FVC: 2.73L FEV1: 2.05L, 113% predicted FEV1/FVC ratio: 75% Interpretation: Spirometry consistent with normal pattern.  Please see scanned spirometry results for details.  Skin Testing: Environmental allergy panel. Positive to grass pollen, ragweed pollen, weed pollen, mold, cat, and borderline to dog.  Results discussed with patient/family.  Airborne Adult Perc - 05/10/21 1433    Time Antigen Placed 1433    Allergen Manufacturer Lavella Hammock    Location Back    Number of Test 59    Panel 1 Select    1. Control-Buffer 50% Glycerol Negative    2. Control-Histamine 1 mg/ml 2+    3. Albumin saline Negative    4. Kiron Negative    5. [place] Negative    6. Johnson Negative    7. [place] Blue Negative    8. Meadow Fescue Negative    9. Perennial Rye Negative  10. Sweet Vernal Negative    11. Timothy Negative    12. Cocklebur Negative    13. Burweed Marshelder Negative    14. Ragweed, short Negative    15. Ragweed, Giant Negative    16. Plantain,  English Negative    17. Lamb's Quarters Negative    18. Sheep Sorrell Negative    19. Rough Pigweed Negative    20. Marsh Elder, Rough Negative    21. Mugwort, Common Negative    22. Ash mix Negative    23. Birch mix Negative    24. Beech American Negative    25. Box, Elder Negative    26. Cedar, red Negative    27. Cottonwood, [place] Negative    28. Elm mix Negative    29.  Hickory Negative    30. Maple mix Negative    31. Oak, [place] mix Negative    32. Pecan Pollen Negative    33. Pine mix Negative    34. Sycamore Eastern Negative    35. Enchanted Oaks, Black Pollen Negative    36. Alternaria alternata Negative    37. Cladosporium Herbarum Negative    38. Aspergillus mix Negative    39. Penicillium mix Negative    40. Bipolaris sorokiniana (Helminthosporium) Negative    41. Drechslera spicifera (Curvularia) Negative    42. Mucor plumbeus Negative    43. Fusarium moniliforme Negative    44. Aureobasidium pullulans (pullulara) Negative    45. Rhizopus oryzae Negative    46. Botrytis cinera Negative    47. Epicoccum nigrum Negative    48. Phoma betae Negative    49. Candida Albicans Negative    50. Trichophyton mentagrophytes Negative    51. Mite, D Farinae  5,000 AU/ml Negative    52. Mite, D Pteronyssinus  5,000 AU/ml Negative    53. Cat Hair 10,000 BAU/ml Negative    54.  Dog Epithelia Negative    55. Mixed Feathers Negative    56. Horse Epithelia Negative    57. Cockroach, German Negative    58. Mouse Negative    59. Tobacco Leaf Negative          Intradermal - 05/10/21 1515    Time Antigen Placed 1515    Allergen Manufacturer Lavella Hammock    Location Arm    Number of Test 15    Control Negative    Guatem[place] Negative    Johnson 2+    7 Grass 2+    Ragweed mix 2+    Weed mix 2+    Tree mix Negative    Mold 1 Negative    Mold 2 Negative    Mold 3 2+    Mold 4 Negative    Cat 2+    Dog --   +/-   Cockroach Negative    Mite mix Negative           Past Medical History: Patient Active Problem List   Diagnosis Date Noted  . Asthma 05/10/2021  . Coughing 05/10/2021  . Heartburn 05/10/2021  . 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 Night sweats 03/28/2021  . Pancreatitis 11/29/2020  . Petechiae 09/06/2020  . Multiple symmetrical lipomatosis 09/06/2020  . Elevated serum creatinine 09/06/2020  . Left upper quadrant pain  09/06/2020  . Obstructive sleep apnea 03/23/2020  . Chronic renal insufficiency 03/19/2020  . Macromastia 12/28/2019  . Contusion of cervical cord ([place]) 08/10/2019  . Polyp, cervix 10/29/2018  . Primary osteoarthritis, right hand 09/05/2016  . Insomnia  02/02/2016  . Benign paroxysmal positional vertigo 01/04/2016  . Primary osteoarthritis of both hips 01/04/2016  . Cervical spondylosis with fibromyalgia 12/09/2013  . Primary osteoarthritis of right knee 12/09/2013  . Other allergic rhinitis 06/03/2013  . Annual physical exam 06/03/2013  . Lumbar spondylosis 01/23/2013  . Hypertension 12/22/2012  . Hyperlipidemia with target LDL less than 100 12/22/2012  . Prediabetes 12/22/2012  . Depression with anxiety 07/15/2012  . Asthma with COPD ([place]) 10/14/2011  . Palpitations 07/05/2011  . GERD (gastroesophageal reflux disease) 02/10/2010  . PMB (postmenopausal bleeding) 02/10/2010  . Left knee revision arthroplasty 02/10/2010  . Stress incontinence 02/10/2010   Past Medical History:  Diagnosis Date  . Allergic rhinitis   . Anxiety   . Asthma   . Fibromyalgia    [name]  . Hyperlipidemia   . Hypertension   . Insomnia 2000  . Lumbar spondylosis   . PONV (postoperative nausea and vomiting)   . Postmenopausal bleeding   . Vaginal Pap smear, abnormal    Past Surgical History: Past Surgical History:  Procedure Laterality Date  . ADENOIDECTOMY    . BREAST BIOPSY  1980   neg  . DILATATION & CURETTAGE/HYSTEROSCOPY WITH MYOSURE N/A 02/17/2020   Procedure: DILATATION & CURETTAGE/HYSTEROSCOPY WITH MYOSURE POLYPECTOMY;  Surgeon: Emily Filbert, MD;  Location: [facility];  Service: Gynecology;  Laterality: N/A;  rep will be here  . REDUCTION MAMMAPLASTY    . TONSILLECTOMY    . TOTAL KNEE ARTHROPLASTY Left 2000, 2015    Dr. Tonita Cong   Medication List:  Current Outpatient Medications  Medication Sig Dispense Refill  . AMBULATORY NON FORMULARY MEDICATION Medication Name:  EMSI "Flex-MT" E-stim unit Use for 30 mins TID 100-150hz  Please include pads.  IPJ:ASNKNLZ Doctor [phone] 1 each 0  . aspirin 81 MG tablet Take 81 mg by mouth daily.    Marland Kitchen atorvastatin (LIPITOR) 10 MG tablet TAKE 1 TABLET (10 MG TOTAL) BY MOUTH DAILY AT 6 PM. 90 tablet 3  . azelastine (ASTELIN) 0.1 % nasal spray Place 1-2 sprays into both nostrils 2 (two) times daily as needed (nasal drainage). Use in each nostril as directed 30 mL 5  . bimatoprost (LATISSE) 0.03 % ophthalmic solution APPLY A SWIPE OF SOLUTION TO UPPER EYELID MARGIN NIGHTLY.    . Calcium Carbonate-Vitamin D 600-400 MG-UNIT tablet Take 1 tablet by mouth 2 (two) times daily. 60 tablet 11  . Carbinoxamine Maleate 4 MG TABS Take 1 to 1.5 tablet twice a day as needed for allergies 60 tablet 3  . DEXILANT 60 MG capsule Take 1 capsule by mouth daily.    . diazepam (VALIUM) 5 MG tablet TAKE 1 TABLET (5 MG TOTAL) BY MOUTH DAILY AS NEEDED. FOR ANXIETY 30 tablet 0  . fluticasone (FLONASE) 50 MCG/ACT nasal spray Place into the nose.    Marland Kitchen KLOR-CON M20 20 MEQ tablet TAKE 1 TABLET BY MOUTH EVERY DAY 90 tablet 3  . levalbuterol (XOPENEX HFA) 45 MCG/ACT inhaler Inhale 1-2 puffs into the lungs every 4 (four) hours as needed for wheezing or shortness of breath (coughing). 1 each 2  . linaclotide (LINZESS) 145 MCG CAPS capsule Take by mouth.    Marland Kitchen lisinopril-hydrochlorothiazide (ZESTORETIC) 20-25 MG tablet Take 1 tablet by mouth daily. 90 tablet 3  . metoprolol succinate (TOPROL-XL) 50 MG 24 hr tablet TAKE 1 TABLET BY MOUTH DAILY WITH OR IMMEDIATELY FOLLOWING A MEAL 90 tablet 3  . Misc. Devices (ALL-BODY MASSAGE) MISC Full body massage 3 times weekly  as needed for pain, this is medically necessary. 1 each 0  . montelukast (SINGULAIR) 10 MG tablet Take 10 mg by mouth daily.    . ondansetron (ZOFRAN-ODT) 8 MG disintegrating tablet Take 1 tablet (8 mg total) by mouth every 8 (eight) hours as needed for nausea. 20 tablet 3  . tiZANidine  (ZANAFLEX) 4 MG tablet TAKE 1 TABLET (4 MG TOTAL) BY MOUTH EVERY 8 (EIGHT) HOURS AS NEEDED FOR MUSCLE SPASMS. 30 tablet 2  . traMADol (ULTRAM) 50 MG tablet Take 1 tablet (50 mg total) by mouth every 12 (twelve) hours as needed. 60 tablet 3  . traZODone (DESYREL) 150 MG tablet TAKE 1 TABLET (150 MG TOTAL) BY MOUTH AT BEDTIME. 90 tablet 3  . zolpidem (AMBIEN) 10 MG tablet TAKE 1 TABLET BY MOUTH EVERYDAY AT BEDTIME 90 tablet 0   No current facility-administered medications for this visit.   Allergies: Allergies  Allergen Reactions  . Bee Venom Anaphylaxis   Social History: Social History   Socioeconomic History  . Marital status: Married    Spouse name: Gwyndolyn Saxon  . Number of children: 4  . Years of education: Not on file  . Highest education level: Master's degree (e.g., MA, MS, MEng, MEd, MSW, MBA)  Occupational History  . 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 Smoking status: Never Smoker  . Smokeless tobacco: Never Used  Vaping Use  . Vaping Use: Never used  Substance and Sexual Activity  . Alcohol use: Yes    Comment: Occasional-Last had a drink a month ago  . Drug use: No  . Sexual activity: Not Currently    Partners: Male    Birth control/protection: None, Post-menopausal  Other Topics Concern  . Not on file  Social History Narrative   Married to [place].    Has grown children, daughter in [place].   Walks for exercise.   Caffeine- 1 c daily   Social Determinants of Health   Financial Resource Strain: Not on file  Food Insecurity: Not on file  Transportation Needs: Not on file  Physical Activity: Not on file  Stress: Not on file  Social Connections: Not on file   Lives in a 68 year old house. Smoking: denies Occupation: retired IT sales professional HistoryFreight forwarder in the house: no Charity fundraiser in the family room: yes Carpet in the bedroom: yes Heating: gas and  electric Cooling: central Pet: no  Family History: Family History  Problem Relation Age of Onset  . Diabetes Mother   . Heart attack Mother        First MI at age 50  . Diabetes type II Mother   . Coronary artery disease Mother   . Hypertension Father   . Diabetes Father   . Alcohol abuse Father   . Pancreatitis Father   . Asthma Daughter   . Asthma Grandchild    Review of Systems  Constitutional: Negative for appetite change, chills, fever and unexpected weight change.  HENT: Positive for congestion, postnasal drip, rhinorrhea and sneezing.   Eyes: Positive for itching.  Respiratory: Positive for cough. Negative for chest tightness, shortness of breath and wheezing.   Cardiovascular: Negative for chest pain.  Gastrointestinal: Negative for abdominal pain.  Genitourinary: Negative for difficulty urinating.  Skin: Negative for rash.  Allergic/Immunologic: Positive for environmental allergies.  Neurological: Negative for headaches.   Objective: BP 140/90 (BP Location: Left Arm, Patient Position: Sitting,  Cuff Size: Normal)   Pulse 95   Temp 98.3 F (36.8 C) (Temporal)   Resp 18   Ht 5\' 3"  (1.6 m)   Wt 209 lb 9.6 oz (95.1 kg)   SpO2 95%   BMI 37.13 kg/m  Body mass index is 37.13 kg/m. Physical Exam Vitals and nursing note reviewed.  Constitutional:      Appearance: Normal appearance. She is well-developed.  HENT:     Head: Normocephalic and atraumatic.     Right Ear: Tympanic membrane and external ear normal.     Left Ear: Tympanic membrane and external ear normal.     Nose: Nose normal.     Mouth/Throat:     Mouth: Mucous membranes are moist.     Pharynx: Oropharynx is clear.  Eyes:     Conjunctiva/sclera: Conjunctivae normal.  Cardiovascular:     Rate and Rhythm: Normal rate and regular rhythm.     Heart sounds: Normal heart sounds. No murmur heard. No friction rub. No gallop.   Pulmonary:     Effort: Pulmonary effort is normal.     Breath sounds: Normal  breath sounds. No wheezing, rhonchi or rales.  Musculoskeletal:     Cervical back: Neck supple.  Skin:    General: Skin is warm.     Findings: No rash.  Neurological:     Mental Status: She is alert and oriented to person, place, and time.  Psychiatric:        Behavior: Behavior normal.    The plan was reviewed with the patient/family, and all questions/concerned were addressed.  It was my pleasure to see Sara [name] today and participate in her care. Please feel free to contact me with any questions or concerns.  Sincerely,  Rexene Alberts, DO Allergy & Immunology  [facility] office: [place] office: [phone]

## 2021-05-10 ENCOUNTER — Other Ambulatory Visit: Payer: Self-pay

## 2021-05-10 ENCOUNTER — Encounter: Payer: Self-pay | Admitting: Allergy

## 2021-05-10 ENCOUNTER — Telehealth: Payer: Self-pay

## 2021-05-10 ENCOUNTER — Ambulatory Visit: Payer: Medicare PPO | Admitting: Allergy

## 2021-05-10 ENCOUNTER — Other Ambulatory Visit: Payer: Self-pay | Admitting: Allergy

## 2021-05-10 VITALS — BP 140/90 | HR 95 | Temp 98.3°F | Resp 18 | Ht 63.0 in | Wt 209.6 lb

## 2021-05-10 DIAGNOSIS — J4551 Severe persistent asthma with (acute) exacerbation: Secondary | ICD-10-CM | POA: Diagnosis not present

## 2021-05-10 DIAGNOSIS — J3089 Other allergic rhinitis: Secondary | ICD-10-CM

## 2021-05-10 DIAGNOSIS — J45909 Unspecified asthma, uncomplicated: Secondary | ICD-10-CM

## 2021-05-10 DIAGNOSIS — T782XXD Anaphylactic shock, unspecified, subsequent encounter: Secondary | ICD-10-CM

## 2021-05-10 DIAGNOSIS — R059 Cough, unspecified: Secondary | ICD-10-CM | POA: Diagnosis not present

## 2021-05-10 DIAGNOSIS — T63484D Toxic effect of venom of other arthropod, undetermined, subsequent encounter: Secondary | ICD-10-CM | POA: Diagnosis not present

## 2021-05-10 DIAGNOSIS — T63481A Toxic effect of venom of other arthropod, accidental (unintentional), initial encounter: Secondary | ICD-10-CM | POA: Insufficient documentation

## 2021-05-10 DIAGNOSIS — T782XXA Anaphylactic shock, unspecified, initial encounter: Secondary | ICD-10-CM | POA: Insufficient documentation

## 2021-05-10 DIAGNOSIS — R12 Heartburn: Secondary | ICD-10-CM

## 2021-05-10 MED ORDER — CARBINOXAMINE MALEATE 4 MG PO TABS: ORAL_TABLET | ORAL | 3 refills | Status: DC

## 2021-05-10 MED ORDER — AZELASTINE HCL 0.1 % NA SOLN: 1.0000 | Freq: Two times a day (BID) | NASAL | 5 refills | Status: DC | PRN

## 2021-05-10 MED ORDER — LEVALBUTEROL TARTRATE 45 MCG/ACT IN AERO: 1.0000 | INHALATION_SPRAY | RESPIRATORY_TRACT | 2 refills | Status: DC | PRN

## 2021-05-10 NOTE — Assessment & Plan Note (Signed)
   See below for lifestyle and dietary modifications.  Continue Dexilant as prescribed.

## 2021-05-10 NOTE — Assessment & Plan Note (Signed)
Coughing especially in the mornings.  Patient does take PPI for reflux and is on an ACE inhibitor for hypertension. The most common causes of chronic cough include the following: upper airway cough syndrome (UACS) which is caused by variety of rhinitis conditions; asthma; gastroesophageal reflux disease (GERD); chronic bronchitis from cigarette smoking or other inhaled environmental irritants; non-asthmatic eosinophilic bronchitis; and bronchiectasis.  In prospective studies, these conditions have accounted for up to 94% of the causes of chronic cough in immunocompetent adults.  The history and physical examination suggest that her cough is multifactorial with contribution possibly from reflux and PND.

## 2021-05-10 NOTE — Assessment & Plan Note (Signed)
Perennial rhinoconjunctivitis symptoms for many years but noticed worsening the past year.  Was allergy tested over 5 years ago which showed multiple positives per patient report.  No prior allergy immunotherapy.  Today's skin testing showed: Positive to grass pollen, ragweed pollen, weed pollen, mold, cat, and borderline to dog.   Start environmental control measures as below.  Take carbinoxamine 4mg  (1 to 1.5 tablet) twice a day as needed.  May use azelastine nasal spray 1-2 sprays per nostril twice a day as needed for runny nose/drainage.  May use Flonase (fluticasone) nasal spray 1 spray per nostril twice a day as needed for nasal congestion.   Nasal saline spray (i.e., Simply Saline) or nasal saline lavage (i.e., NeilMed) is recommended as needed and prior to medicated nasal sprays.  Continue Singulair (montelukast) 10mg  daily at night.  Consider allergy injections for long term control if above medications do not help the symptoms - handout given.

## 2021-05-10 NOTE — Patient Instructions (Addendum)
Today's skin testing showed: Positive to grass pollen, ragweed pollen, weed pollen, mold, cat, and borderline to dog.   Environmental allergies  Start environmental control measures as below.  Take carbinoxamine 4mg  (1 to 1.5 tablet) twice a day as needed.  May use azelastine nasal spray 1-2 sprays per nostril twice a day as needed for runny nose/drainage.  May use Flonase (fluticasone) nasal spray 1 spray per nostril twice a day as needed for nasal congestion.   Nasal saline spray (i.e., Simply Saline) or nasal saline lavage (i.e., NeilMed) is recommended as needed and prior to medicated nasal sprays.  Continue Singulair (montelukast) 10mg  daily at night.  Consider allergy injections for long term control if above medications do not help the symptoms - handout given.   Asthma:  Normal spirometry today.  May use levoalbuterol rescue inhaler 2 puffs or nebulizer every 4 to 6 hours as needed for shortness of breath, chest tightness, coughing, and wheezing.  This medication should not cause you to have palpitations.   Use this instead of albuterol.   Monitor frequency of use.   Coughing: The most common causes of chronic cough include the following: upper airway cough syndrome (UACS) which is caused by variety of rhinitis conditions; asthma; gastroesophageal reflux disease (GERD); chronic bronchitis from cigarette smoking or other inhaled environmental irritants; non-asthmatic eosinophilic bronchitis; and bronchiectasis.   Heartburn:  See below for lifestyle and dietary modifications.  Continue Dexilant as prescribed.   Bee sting reaction:  Avoid bee stings.  For mild symptoms you can take over the counter antihistamines such as Benadryl and monitor symptoms closely. If symptoms worsen or if you have severe symptoms including breathing issues, throat closure, significant swelling, whole body hives, severe diarrhea and vomiting, lightheadedness then inject epinephrine and seek  immediate medical care afterwards.  Action plan given. Get bloodwork:  We are ordering labs, so please allow 1-2 weeks for the results to come back. With the newly implemented Cures Act, the labs might be visible to you at the same time that they become visible to me. However, I will not address the results until all of the results are back, so please be patient.   Follow up in 2 months or sooner if needed.   Reducing Pollen Exposure . Pollen seasons: trees (spring), grass (summer) and ragweed/weeds (fall). Marland Kitchen Keep windows closed in your home and car to lower pollen exposure.  Susa Simmonds air conditioning in the bedroom and throughout the house if possible.  . Avoid going out in dry windy days - especially early morning. . Pollen counts are highest between 5 - 10 AM and on dry, hot and windy days.  . Save outside activities for late afternoon or after a heavy rain, when pollen levels are lower.  . Avoid mowing of grass if you have grass pollen allergy. Marland Kitchen Be aware that pollen can also be transported indoors on people and pets.  . Dry your clothes in an automatic dryer rather than hanging them outside where they might collect pollen.  . Rinse hair and eyes before bedtime. Mold Control . Mold and fungi can grow on a variety of surfaces provided certain temperature and moisture conditions exist.  . Outdoor molds grow on plants, decaying vegetation and soil. The major outdoor mold, Alternaria and Cladosporium, are found in very high numbers during hot and dry conditions. Generally, a late summer - fall peak is seen for common outdoor fungal spores. Rain will temporarily lower outdoor mold spore count, but counts rise  rapidly when the rainy period ends. . The most important indoor molds are Aspergillus and Penicillium. Dark, humid and poorly ventilated basements are ideal sites for mold growth. The next most common sites of mold growth are the bathroom and the kitchen. Outdoor (Seasonal) Mold  Control . Use air conditioning and keep windows closed. . Avoid exposure to decaying vegetation. Marland Kitchen Avoid leaf raking. . Avoid grain handling. . Consider wearing a face mask if working in moldy areas.  Indoor (Perennial) Mold Control  . Maintain humidity below 50%. . Get rid of mold growth on hard surfaces with water, detergent and, if necessary, 5% bleach (do not mix with other cleaners). Then dry the area completely. If mold covers an area more than 10 square feet, consider hiring an indoor environmental professional. . For clothing, washing with soap and water is best. If moldy items cannot be cleaned and dried, throw them away. . Remove sources e.g. contaminated carpets. . Repair and seal leaking roofs or pipes. Using dehumidifiers in damp basements may be helpful, but empty the water and clean units regularly to prevent mildew from forming. All rooms, especially basements, bathrooms and kitchens, require ventilation and cleaning to deter mold and mildew growth. Avoid carpeting on concrete or damp floors, and storing items in damp areas. Pet Allergen Avoidance: . Contrary to popular opinion, there are no "hypoallergenic" breeds of dogs or cats. That is because people are not allergic to an animal's hair, but to an allergen found in the animal's saliva, dander (dead skin flakes) or urine. Pet allergy symptoms typically occur within minutes. For some people, symptoms can build up and become most severe 8 to 12 hours after contact with the animal. People with severe allergies can experience reactions in public places if dander has been transported on the pet owners' clothing. Marland Kitchen Keeping an animal outdoors is only a partial solution, since homes with pets in the yard still have higher concentrations of animal allergens. . Before getting a pet, ask your allergist to determine if you are allergic to animals. If your pet is already considered part of your family, try to minimize contact and keep the pet  out of the bedroom and other rooms where you spend a great deal of time. . As with dust mites, vacuum carpets often or replace carpet with a hardwood floor, tile or linoleum. . High-efficiency particulate air (HEPA) cleaners can reduce allergen levels over time. . While dander and saliva are the source of cat and dog allergens, urine is the source of allergens from rabbits, hamsters, mice and [place] pigs; so ask a non-allergic family member to clean the animal's cage. . If you have a pet allergy, talk to your allergist about the potential for allergy immunotherapy (allergy shots). This strategy can often provide long-term relief.   Heartburn Heartburn is a type of pain or discomfort that can happen in your throat or chest. It is often described as a burning pain. It may also cause a bad, acid-like taste in your mouth. It may be caused by stomach contents that move back up (reflux) into the part of the body that moves food from your mouth to your stomach (esophagus). Heartburn may feel worse:  When you lie down.  When you bend over.  At night. Follow these instructions at home: Eating and drinking  Avoid certain foods and drinks as told by your doctor. This may include: ? Coffee and tea, with or without caffeine. ? Drinks that have alcohol. ? Energy drinks and  sports drinks. ? Carbonated drinks or sodas. ? Chocolate and cocoa. ? Peppermint and mint flavorings. ? Garlic and onions. ? Horseradish. ? Spicy and acidic foods, such as:  Peppers.  Chili powder and curry powder.  Vinegar.  Hot sauces and BBQ sauce. ? Citrus fruit juices and citrus fruits, such as:  Oranges.  Lemons.  Limes. ? Tomato-based foods, such as:  Red sauce and pizza with red sauce.  Chili.  Salsa. ? Fried and fatty foods, such as:  Donuts.  [place] fries and potato chips.  High-fat dressings. ? High-fat meats, such as:  Hot dogs and sausage.  Rib eye steak.  Ham and bacon. ? High-fat  dairy items, such as:  Whole milk.  Butter.  Cream cheese.  Eat small meals often. Avoid eating large meals.  Avoid drinking large amounts of liquid with your meals.  Avoid eating meals during the 2-3 hours before bedtime.  Avoid lying down right after you eat.  Do not exercise right after you eat.   Lifestyle  If you are overweight, lose an amount of weight that is healthy for you. Ask your doctor about a safe weight loss goal.  Do not smoke or use any products that contain nicotine or tobacco. These can make your symptoms worse. If you need help quitting, ask your doctor.  Wear loose clothes. Do not wear anything tight around your waist.  Raise (elevate) the head of your bed about 6 inches (15 cm) when you sleep. You can use a wedge to do this.  Try to lower your stress. If you need help doing this, ask your doctor.      Medicines  Take over-the-counter and prescription medicines only as told by your doctor.  Do not take aspirin or NSAIDs, such as ibuprofen, unless your doctor says it is okay.  Stop medicines only as told by your doctor. If you stop taking some medicines too quickly, your symptoms may get worse. General instructions  Watch for any changes in your symptoms.  Keep all follow-up visits. Contact a doctor if:  You have new symptoms.  You lose weight and you do not know why.  You have trouble swallowing, or it hurts to swallow.  You have wheezing or a cough that keeps happening.  Your symptoms do not get better with treatment.  You have heartburn often for more than 2 weeks. Get help right away if:  You have pain in your arms, neck, jaw, teeth, or back all of a sudden.  You feel sweaty, dizzy, or light-headed all of a sudden.  You have chest pain or shortness of breath.  You vomit and your vomit looks like blood or coffee grounds.  Your poop (stool) is bloody or black. These symptoms may be an emergency. Get help right away. Call your  local emergency services (911 in the [place]  Do not wait to see if the symptoms will go away.  Do not drive yourself to the hospital. Summary  Heartburn is a type of pain that can happen in your throat or chest. It can feel like a burning pain. It may also cause a bad, acid-like taste in your mouth.  You may need to avoid certain foods and drinks to help your symptoms. Ask your doctor what foods and drinks you should avoid.  Take over-the-counter and prescription medicines only as told by your doctor. Do not take aspirin or NSAIDs, such as ibuprofen, unless your doctor told you to do so.  Contact your doctor  if your symptoms do not get better or they get worse. This information is not intended to replace advice given to you by your health care provider. Make sure you discuss any questions you have with your health care provider. Document Revised: 06/22/2020 Document Reviewed: 06/22/2020 Elsevier Patient Education  [place].

## 2021-05-10 NOTE — Assessment & Plan Note (Signed)
Throat tightness and trouble breathing in the past which required EpiPen use once.  No prior work-up.  Continue avoidance.   For mild symptoms you can take over the counter antihistamines such as Benadryl and monitor symptoms closely. If symptoms worsen or if you have severe symptoms including breathing issues, throat closure, significant swelling, whole body hives, severe diarrhea and vomiting, lightheadedness then inject epinephrine and seek immediate medical care afterwards.  Action plan given. Get bloodwork as below.

## 2021-05-10 NOTE — Assessment & Plan Note (Signed)
Diagnosed with asthma over 20 years ago and has weekly coughing, shortness of breath and chest tightness.  Does not like to use albuterol as it causes palpitations.  Today's spirometry was normal.  May use levoalbuterol rescue inhaler 2 puffs or nebulizer every 4 to 6 hours as needed for shortness of breath, chest tightness, coughing, and wheezing.  Monitor frequency of use.

## 2021-05-10 NOTE — Telephone Encounter (Signed)
Pt left message on VM stating she was having PMB and needed appt. Called pt back. Pt did not answer. VM left asking pt to return call to office.

## 2021-05-11 DIAGNOSIS — M25511 Pain in right shoulder: Secondary | ICD-10-CM | POA: Diagnosis not present

## 2021-05-11 DIAGNOSIS — M25512 Pain in left shoulder: Secondary | ICD-10-CM | POA: Diagnosis not present

## 2021-05-11 NOTE — Telephone Encounter (Signed)
Albuterol is preferred or has she already failed albuterol. I did not see it listed as failed. Please advise

## 2021-05-11 NOTE — Telephone Encounter (Signed)
Does not like to use albuterol as it causes palpitations.  Can you start PA for xopenex?  Thank you.

## 2021-05-12 NOTE — Telephone Encounter (Signed)
Submitted pa thru cover my meds

## 2021-05-12 NOTE — Telephone Encounter (Signed)
Pa has been approved for xopenex

## 2021-05-13 LAB — ALLERGEN HYMENOPTERA PANEL
Bumblebee: <0.1 kU/L
Honeybee IgE: <0.1 kU/L
Hornet, White Face, IgE: <0.1 kU/L
Hornet, Yellow, IgE: <0.1 kU/L
Paper Wasp IgE: <0.1 kU/L
Yellow Jacket, IgE: <0.1 kU/L

## 2021-05-13 LAB — TRYPTASE: Tryptase: 4.8 ug/L (ref 2.2–13.2)

## 2021-05-15 NOTE — Telephone Encounter (Signed)
Thank you :)

## 2021-05-30 DIAGNOSIS — S14109S Unspecified injury at unspecified level of cervical spinal cord, sequela: Secondary | ICD-10-CM | POA: Diagnosis not present

## 2021-05-30 DIAGNOSIS — Z8679 Personal history of other diseases of the circulatory system: Secondary | ICD-10-CM | POA: Diagnosis not present

## 2021-05-30 DIAGNOSIS — M79601 Pain in right arm: Secondary | ICD-10-CM | POA: Diagnosis not present

## 2021-05-31 ENCOUNTER — Other Ambulatory Visit: Payer: Self-pay | Admitting: Sports Medicine

## 2021-05-31 ENCOUNTER — Other Ambulatory Visit: Payer: Self-pay

## 2021-05-31 ENCOUNTER — Ambulatory Visit: Payer: Medicare PPO | Admitting: Sports Medicine

## 2021-05-31 ENCOUNTER — Encounter: Payer: Self-pay | Admitting: Sports Medicine

## 2021-05-31 DIAGNOSIS — I10 Essential (primary) hypertension: Secondary | ICD-10-CM

## 2021-05-31 DIAGNOSIS — G4700 Insomnia, unspecified: Secondary | ICD-10-CM

## 2021-05-31 MED ORDER — METOPROLOL SUCCINATE ER 100 MG PO TB24: 100.0000 mg | ORAL_TABLET | Freq: Every day | ORAL | 3 refills | Status: DC

## 2021-05-31 MED ORDER — AMLODIPINE BESYLATE 5 MG PO TABS: 5.0000 mg | ORAL_TABLET | Freq: Every day | ORAL | 3 refills | Status: DC

## 2021-05-31 NOTE — Assessment & Plan Note (Addendum)
Sara [name] is feeling off balance, no focal neurologic symptoms. Her blood pressure has been very elevated on several occasions at her pain clinic and here, increasing metoprolol to 100 mg daily, continue lisinopril/HCTZ 20/25 mg, adding amlodipine 5 mg, return in 2 weeks and a nurse visit blood pressure check. This is a chronic process with exacerbation and pharmacologic management.

## 2021-05-31 NOTE — Progress Notes (Signed)
    Procedures performed today:    None.  Independent interpretation of notes and tests performed by another provider:   None.  Brief History, Exam, Impression, and Recommendations:    Hypertension Sara [name] is feeling off balance, no focal neurologic symptoms. Her blood pressure has been very elevated on several occasions at her pain clinic and here, increasing metoprolol to 100 mg daily, continue lisinopril/HCTZ 20/25 mg, adding amlodipine 5 mg, return in 2 weeks and a nurse visit blood pressure check. This is a chronic process with exacerbation and pharmacologic management.    ___________________________________________ [name]. [name], M.D., ABFM., CAQSM. Primary Care and [place] Instructor of [place] of [facility] of Medicine

## 2021-06-08 DIAGNOSIS — Z8672 Personal history of thrombophlebitis: Secondary | ICD-10-CM | POA: Diagnosis not present

## 2021-06-08 DIAGNOSIS — M79601 Pain in right arm: Secondary | ICD-10-CM | POA: Diagnosis not present

## 2021-06-09 ENCOUNTER — Telehealth: Payer: Self-pay | Admitting: Allergy

## 2021-06-09 NOTE — Telephone Encounter (Signed)
Dr. Maudie Mercury Please advise:  Sara [name] was seen on 05/10/2021 and has been taking Astelin and Carbinoxamine Maleate as directed but feels it is not working. Sara [name] is still having headaches, sneezing and runny nose. Sara [name] would like to know what else to do.

## 2021-06-09 NOTE — Telephone Encounter (Signed)
Patient was seen on 05/10/2021 and has been taking Astelin and Carbinoxamine Maleate as directed but feels it is not working. Patient is still having headaches, sneezing and runny nose. Patient would like to know what else to do.  Please advise.

## 2021-06-11 DIAGNOSIS — M25511 Pain in right shoulder: Secondary | ICD-10-CM | POA: Diagnosis not present

## 2021-06-11 DIAGNOSIS — M25512 Pain in left shoulder: Secondary | ICD-10-CM | POA: Diagnosis not present

## 2021-06-12 ENCOUNTER — Other Ambulatory Visit: Payer: Self-pay | Admitting: Sports Medicine

## 2021-06-12 DIAGNOSIS — I10 Essential (primary) hypertension: Secondary | ICD-10-CM

## 2021-06-12 DIAGNOSIS — F418 Other specified anxiety disorders: Secondary | ICD-10-CM

## 2021-06-12 MED ORDER — MONTELUKAST SODIUM 10 MG PO TABS: 10.0000 mg | ORAL_TABLET | Freq: Every day | ORAL | 5 refills | Status: DC

## 2021-06-12 NOTE — Telephone Encounter (Signed)
Patient called back and was advised of Dr. Julianne Rice medication change and recommendations. Patient verbalized understanding.

## 2021-06-12 NOTE — Telephone Encounter (Signed)
Please call patient.  I'm going to add on Singulair 10mg  daily at night. Rare side effects of below: Cautioned that in some children/adults can experience behavioral changes including hyperactivity, agitation, depression, sleep disturbances and suicidal ideations. These side effects are rare, but if you notice them you should notify me and discontinue Singulair (montelukast).  Make sure she is also doing the Flonase 1 spray per nostril twice a day in addition to the azelastine nasal spray  She should also do saline washes in the mornings and at night.   If no improvement, recommend allergy injections next.

## 2021-06-12 NOTE — Telephone Encounter (Signed)
Called and left a voicemail asking for patient to return call to discuss.  °

## 2021-06-12 NOTE — Addendum Note (Signed)
Addended by: Garnet Sierras on: 06/12/2021 08:44 AM   Modules accepted: Orders

## 2021-06-14 ENCOUNTER — Ambulatory Visit (INDEPENDENT_AMBULATORY_CARE_PROVIDER_SITE_OTHER): Payer: Medicare PPO | Admitting: Sports Medicine

## 2021-06-14 ENCOUNTER — Other Ambulatory Visit: Payer: Self-pay

## 2021-06-14 VITALS — BP 136/93 | HR 84

## 2021-06-14 DIAGNOSIS — I10 Essential (primary) hypertension: Secondary | ICD-10-CM

## 2021-06-14 NOTE — Assessment & Plan Note (Signed)
This pleasant 70 year old female is here for a hypertension follow-up, we increased her metoprolol to 100 mg daily, lisinopril/HCTZ continued at 20/25, I added amlodipine 5 mg at the last visit her blood pressures in the 130s over 90s today, this is acceptable for a 70 year old with a history of falls, she can follow-up with me in a month or so.

## 2021-06-14 NOTE — Progress Notes (Signed)
Established Patient Office Visit  Subjective:  Patient ID: Sara [name], female    DOB: 1951/09/27  Age: 70 y.o. MRN: [id number]  CC:  Chief Complaint  Patient presents with   Hypertension    HPI Sara [name] presents for blood pressure check. She reports her home readings around 156/94. Denies chest pain shortness of breath or dizziness.   Past Medical History:  Diagnosis Date   Allergic rhinitis    Anxiety    Asthma    Fibromyalgia    [name]   Hyperlipidemia    Hypertension    Insomnia 2000   Lumbar spondylosis    PONV (postoperative nausea and vomiting)    Postmenopausal bleeding    Vaginal Pap smear, abnormal     Past Surgical History:  Procedure Laterality Date   ADENOIDECTOMY     BREAST BIOPSY  1980   neg   DILATATION & CURETTAGE/HYSTEROSCOPY WITH MYOSURE N/A 02/17/2020   Procedure: DILATATION & CURETTAGE/HYSTEROSCOPY WITH MYOSURE POLYPECTOMY;  Surgeon: Emily Filbert, MD;  Location: [facility];  Service: Gynecology;  Laterality: N/A;  rep will be here   REDUCTION MAMMAPLASTY     TONSILLECTOMY     TOTAL KNEE ARTHROPLASTY Left 2000, 2015    Dr. 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    Has grown children, daughter in [place].   Walks for exercise.   Caffeine- 1 c daily   Social Determinants of Health   Financial Resource Strain: Not on file  Food Insecurity: Not on file  Transportation Needs: Not on file  Physical Activity: Not on file  Stress: Not on file  Social Connections: Not on file  Intimate Partner Violence: Not on file    Outpatient Medications Prior to Visit  Medication Sig Dispense Refill   Bureau Medication Name: EMSI "Flex-MT" E-stim unit Use for 30 mins TID 100-150hz  Please include pads.  WUJ:[id number] Doctor [phone] 1 each 0   amLODipine (NORVASC) 5 MG tablet TAKE 1 TABLET (5 MG TOTAL) BY MOUTH DAILY. 90 tablet 1   aspirin 81 MG tablet Take 81 mg by mouth daily.     atorvastatin (LIPITOR)  10 MG tablet TAKE 1 TABLET (10 MG TOTAL) BY MOUTH DAILY AT 6 PM. 90 tablet 3   azelastine (ASTELIN) 0.1 % nasal spray Place 1-2 sprays into both nostrils 2 (two) times daily as needed (nasal drainage). Use in each nostril as directed 30 mL 5   bimatoprost (LATISSE) 0.03 % ophthalmic solution APPLY A SWIPE OF SOLUTION TO UPPER EYELID MARGIN NIGHTLY.     Calcium Carbonate-Vitamin D 600-400 MG-UNIT tablet Take 1 tablet by mouth 2 (two) times daily. 60 tablet 11   Carbinoxamine Maleate 4 MG TABS Take 1 to 1.5 tablet twice a day as needed for allergies 60 tablet 3   DEXILANT 60 MG capsule Take 1 capsule by mouth daily.     diazepam (VALIUM) 5 MG tablet TAKE 1 TABLET (5 MG TOTAL) BY MOUTH DAILY AS NEEDED. FOR ANXIETY 30 tablet 0   fluticasone (FLONASE) 50 MCG/ACT nasal spray Place into the nose.     KLOR-CON M20 20 MEQ tablet TAKE  1 TABLET BY MOUTH EVERY DAY 90 tablet 3   levalbuterol (XOPENEX HFA) 45 MCG/ACT inhaler INHALE 1-2 PUFFS INTO THE LUNGS EVERY 4 HOURS AS NEEDED FOR WHEEZING/SHORTNESS OF BREATH (COUGHING). 15 each 2   linaclotide (LINZESS) 145 MCG CAPS capsule Take by mouth.     lisinopril-hydrochlorothiazide (ZESTORETIC) 20-25 MG tablet Take 1 tablet by mouth daily. 90 tablet 3   metoprolol succinate (TOPROL-XL) 100 MG 24 hr tablet TAKE 1 TABLET BY MOUTH DAILY. TAKE WITH OR IMMEDIATELY FOLLOWING A MEAL. 90 tablet 1   Misc. Devices (ALL-BODY MASSAGE) MISC Full body massage 3 times weekly as needed for pain, this is medically necessary. 1 each 0   montelukast (SINGULAIR) 10 MG tablet Take 1 tablet (10 mg total) by mouth at bedtime. 30 tablet 5   ondansetron (ZOFRAN-ODT) 8 MG disintegrating tablet Take 1 tablet (8 mg total) by mouth every 8 (eight) hours as needed for nausea. 20 tablet 3   tiZANidine (ZANAFLEX) 4 MG tablet TAKE 1 TABLET (4 MG TOTAL) BY MOUTH EVERY 8 (EIGHT) HOURS AS NEEDED FOR MUSCLE SPASMS. 30 tablet 2   traMADol (ULTRAM) 50 MG tablet Take 1 tablet (50 mg total) by mouth every 12 (twelve) hours as needed. 60 tablet 3   traZODone (DESYREL) 150 MG tablet TAKE 1 TABLET (150 MG TOTAL) BY MOUTH AT BEDTIME. 90 tablet 3   zolpidem (AMBIEN) 10 MG tablet TAKE 1 TABLET BY MOUTH EVERYDAY AT BEDTIME 90 tablet 0   No facility-administered medications prior to visit.    Allergies  Allergen Reactions   Bee Venom Anaphylaxis    ROS Review of Systems    Objective:    Physical Exam  BP (!) 136/93   Pulse 84   SpO2 97%  Wt Readings from Last 3 Encounters:  05/31/21 213 lb (96.6 kg)  05/10/21 209 lb 9.6 oz (95.1 kg)  03/29/21 211 lb 6.4 oz (95.9 kg)     There are no preventive care reminders to display for this patient.  There are no preventive care reminders to display for this patient.  Lab Results  Component Value Date   TSH 2.73 03/30/2021   Lab Results  Component Value Date   WBC 8.2  03/30/2021   HGB 13.9 03/30/2021   HCT 41.7 03/30/2021   MCV 95.0 03/30/2021   PLT 287 03/30/2021   Lab Results  Component Value Date   NA 141 03/30/2021   K 4.0 03/30/2021   CO2 29 03/30/2021   GLUCOSE 89 03/30/2021  BUN 24 03/30/2021   CREATININE 1.05 (H) 03/30/2021   BILITOT 0.7 03/30/2021   ALKPHOS 52 09/20/2016   AST 15 03/30/2021   ALT 13 03/30/2021   PROT 7.3 03/30/2021   ALBUMIN 4.5 09/20/2016   CALCIUM 10.2 03/30/2021   GFR 71.55 07/30/2011   Lab Results  Component Value Date   CHOL 156 03/30/2021   Lab Results  Component Value Date   HDL 72 03/30/2021   Lab Results  Component Value Date   LDLCALC 71 03/30/2021   Lab Results  Component Value Date   TRIG 52 03/30/2021   Lab Results  Component Value Date   CHOLHDL 2.2 03/30/2021   Lab Results  Component Value Date   HGBA1C 5.5 03/30/2021      Assessment & Plan:  Hypertension - Patient advised to continue current medications. Follow up in 4 weeks with Dr [name].   Problem List Items Addressed This Visit     Hypertension - Primary    No orders of the defined types were placed in this encounter.   Follow-up: Return in about 4 weeks (around 07/12/2021) for blood pressure check with Dr [name]. .    Durene Romans, Monico Blitz, CMA

## 2021-06-19 DIAGNOSIS — N393 Stress incontinence (female) (male): Secondary | ICD-10-CM | POA: Diagnosis not present

## 2021-06-19 DIAGNOSIS — Z4689 Encounter for fitting and adjustment of other specified devices: Secondary | ICD-10-CM | POA: Diagnosis not present

## 2021-06-23 ENCOUNTER — Other Ambulatory Visit: Payer: Self-pay

## 2021-06-23 ENCOUNTER — Ambulatory Visit: Payer: Medicare PPO | Admitting: Sports Medicine

## 2021-06-23 ENCOUNTER — Ambulatory Visit (INDEPENDENT_AMBULATORY_CARE_PROVIDER_SITE_OTHER): Payer: Medicare PPO

## 2021-06-23 DIAGNOSIS — M25511 Pain in right shoulder: Secondary | ICD-10-CM

## 2021-06-23 DIAGNOSIS — I10 Essential (primary) hypertension: Secondary | ICD-10-CM

## 2021-06-23 DIAGNOSIS — G8929 Other chronic pain: Secondary | ICD-10-CM | POA: Diagnosis not present

## 2021-06-23 DIAGNOSIS — M75101 Unspecified rotator cuff tear or rupture of right shoulder, not specified as traumatic: Secondary | ICD-10-CM | POA: Insufficient documentation

## 2021-06-23 DIAGNOSIS — G4733 Obstructive sleep apnea (adult) (pediatric): Secondary | ICD-10-CM | POA: Diagnosis not present

## 2021-06-23 DIAGNOSIS — N1832 Chronic kidney disease, stage 3b: Secondary | ICD-10-CM | POA: Diagnosis not present

## 2021-06-23 DIAGNOSIS — N2889 Other specified disorders of kidney and ureter: Secondary | ICD-10-CM

## 2021-06-23 MED ORDER — AMLODIPINE BESYLATE 10 MG PO TABS: 10.0000 mg | ORAL_TABLET | Freq: Every day | ORAL | 11 refills | Status: DC

## 2021-06-23 NOTE — Assessment & Plan Note (Signed)
Right anterior shoulder pain, worse with abduction, likely rotator cuff, impingement symptoms on exam. Adding x-rays, MRI right shoulder. We will do a subacromial injection at the MRI follow-up visit if needed.

## 2021-06-23 NOTE — Progress Notes (Signed)
    Procedures performed today:    None.  Independent interpretation of notes and tests performed by another provider:   None.  Brief History, Exam, Impression, and Recommendations:    Chronic renal insufficiency Rechecking renal function. She has been doing meloxicam, this does help her function so I think we are just going to have to balance her creatinine and her pain control.  Hypertension Blood pressure continues to be elevated on lisinopril/HCTZ 20/25, metoprolol 100 mg daily, her blood pressure improved slightly with the addition of amlodipine 5 at the last visit, going up to amlodipine 10, 2-week follow-up nurse visit.  Obstructive sleep apnea Certainly relevant with regards to her blood pressure, she is on CPAP.    ___________________________________________ Sara [name], M.D., ABFM., CAQSM. Primary Care and [place] Instructor of [place] of [facility] of Medicine

## 2021-06-23 NOTE — Assessment & Plan Note (Signed)
Certainly relevant with regards to her blood pressure, she is on CPAP.

## 2021-06-23 NOTE — Assessment & Plan Note (Signed)
Rechecking renal function. She has been doing meloxicam, this does help her function so I think we are just going to have to balance her creatinine and her pain control.

## 2021-06-23 NOTE — Assessment & Plan Note (Signed)
Blood pressure continues to be elevated on lisinopril/HCTZ 20/25, metoprolol 100 mg daily, her blood pressure improved slightly with the addition of amlodipine 5 at the last visit, going up to amlodipine 10, 2-week follow-up nurse visit.

## 2021-06-24 LAB — COMPLETE METABOLIC PANEL WITH GFR
AG Ratio: 1.6 (calc) (ref 1.0–2.5)
ALT: 11 U/L (ref 6–29)
AST: 17 U/L (ref 10–35)
Albumin: 4.4 g/dL (ref 3.6–5.1)
Alkaline phosphatase (APISO): 73 U/L (ref 37–153)
BUN: 16 mg/dL (ref 7–25)
CO2: 32 mmol/L (ref 20–32)
Calcium: 9.9 mg/dL (ref 8.6–10.4)
Chloride: 104 mmol/L (ref 98–110)
Creat: 0.95 mg/dL (ref 0.50–0.99)
GFR, Est African American: 71 mL/min/{1.73_m2} (ref >=60)
GFR, Est Non African American: 62 mL/min/{1.73_m2} (ref >=60)
Globulin: 2.8 g/dL (calc) (ref 1.9–3.7)
Glucose, Bld: 120 mg/dL — ABNORMAL HIGH (ref 65–99)
Potassium: 4.2 mmol/L (ref 3.5–5.3)
Sodium: 141 mmol/L (ref 135–146)
Total Bilirubin: 0.5 mg/dL (ref 0.2–1.2)
Total Protein: 7.2 g/dL (ref 6.1–8.1)

## 2021-06-24 LAB — CBC
HCT: 41.2 % (ref 35.0–45.0)
Hemoglobin: 13.9 g/dL (ref 11.7–15.5)
MCH: 31.9 pg (ref 27.0–33.0)
MCHC: 33.7 g/dL (ref 32.0–36.0)
MCV: 94.5 fL (ref 80.0–100.0)
MPV: 9.1 fL (ref 7.5–12.5)
Platelets: 370 10*3/uL (ref 140–400)
RBC: 4.36 10*6/uL (ref 3.80–5.10)
RDW: 12.8 % (ref 11.0–15.0)
WBC: 9.9 10*3/uL (ref 3.8–10.8)

## 2021-07-04 ENCOUNTER — Telehealth: Payer: Self-pay | Admitting: Sports Medicine

## 2021-07-04 NOTE — Telephone Encounter (Signed)
Attempted to call the insurance company to set up peer to peer, they told me that their physician would review the clinicals and then call our office back.  I have a suspicion this will not get covered until 6 weeks of the patient will just need to try an injection and then wait another 6 weeks.  Tracking #[id number], phone number [phone].

## 2021-07-05 DIAGNOSIS — Z20822 Contact with and (suspected) exposure to covid-19: Secondary | ICD-10-CM | POA: Diagnosis not present

## 2021-07-07 ENCOUNTER — Other Ambulatory Visit: Payer: Self-pay | Admitting: Sports Medicine

## 2021-07-07 ENCOUNTER — Ambulatory Visit: Payer: Medicare PPO

## 2021-07-07 DIAGNOSIS — F418 Other specified anxiety disorders: Secondary | ICD-10-CM

## 2021-07-08 ENCOUNTER — Ambulatory Visit (INDEPENDENT_AMBULATORY_CARE_PROVIDER_SITE_OTHER): Payer: Medicare PPO

## 2021-07-08 ENCOUNTER — Other Ambulatory Visit: Payer: Self-pay

## 2021-07-08 DIAGNOSIS — G8929 Other chronic pain: Secondary | ICD-10-CM

## 2021-07-08 DIAGNOSIS — M25511 Pain in right shoulder: Secondary | ICD-10-CM

## 2021-07-08 NOTE — Patient Instructions (Addendum)
  Seasonal and perennial allergic rhinitis Continue environmental control measures directed towards: grass, ragweed, weed pollen, mold, cat, and borderline dog Continue carbinoxamine 4mg  (1 to 1.5 tablet) twice a day as needed. Continue azelastine nasal spray 1-2 sprays per nostril twice a day as needed for runny nose/drainage. May use Flonase (fluticasone) nasal spray 1 spray per nostril twice a day as needed for nasal congestion.  Nasal saline spray (i.e., Simply Saline) or nasal saline lavage (i.e., NeilMed) is recommended as needed and prior to medicated nasal sprays. Continue Singulair (montelukast) 10mg  daily at night. Recommend starting allergy injections for long term control if above medications do not help the symptoms  May use Mucinex 600 to 1200 mg twice a day as needed for thick postnasal drip.  Make sure and drink plenty of fluids while taking Mucinex  Asthma May use levoalbuterol rescue inhaler 2 puffs or nebulizer every 4 to 6 hours as needed for shortness of breath, chest tightness, coughing, and wheezing. Monitor frequency of use.  Start a 2 month trial of Flovent 110 mcg 2 puffs twice a day with spacer to help prevent cough and wheeze Start prednisone 10 mg taking 1 tablet twice a day for 4 days, then on the fifth day take 1 tablet and stop  Coughing She would like to hold off on stopping Ace Inhibitor for now and try Flovent. Discussed that if cough continues at next appointment we would be discussing this further Get PA/lateral chest x-ray due to cough. We will call you with results once they are back Stop Robitussin The most common causes of chronic cough include the following: upper airway cough syndrome (UACS) which is caused by variety of rhinitis conditions; asthma; gastroesophageal reflux disease (GERD); chronic bronchitis from cigarette smoking or other inhaled environmental irritants; non-asthmatic eosinophilic bronchitis; and bronchiectasis.   Heartburn Continue  lifestyle and dietary modifications. Continue Dexilant as prescribed.   Anaphylaxis due to hymenoptera venom Avoid bee stings. In case of an allergic reaction, give Benadryl 4 teaspoonfuls every 4 hours, and if life-threatening symptoms occur, inject with EpiPen 0.3 mg.  If heaviness persists in chest please proceed to the emergency room.  Follow up in 2 months or sooner if needed. Also, schedule an appointment in 2-3 weeks to start allergy injections.

## 2021-07-10 ENCOUNTER — Ambulatory Visit (INDEPENDENT_AMBULATORY_CARE_PROVIDER_SITE_OTHER): Payer: Medicare PPO | Admitting: Sports Medicine

## 2021-07-10 ENCOUNTER — Encounter: Payer: Self-pay | Admitting: Family

## 2021-07-10 ENCOUNTER — Other Ambulatory Visit: Payer: Self-pay | Admitting: Allergy

## 2021-07-10 ENCOUNTER — Other Ambulatory Visit: Payer: Self-pay

## 2021-07-10 ENCOUNTER — Ambulatory Visit (INDEPENDENT_AMBULATORY_CARE_PROVIDER_SITE_OTHER): Payer: Medicare PPO | Admitting: Family

## 2021-07-10 VITALS — Ht 63.0 in | Wt 209.0 lb

## 2021-07-10 VITALS — BP 137/83 | HR 90

## 2021-07-10 DIAGNOSIS — J45909 Unspecified asthma, uncomplicated: Secondary | ICD-10-CM

## 2021-07-10 DIAGNOSIS — T63484D Toxic effect of venom of other arthropod, undetermined, subsequent encounter: Secondary | ICD-10-CM

## 2021-07-10 DIAGNOSIS — R059 Cough, unspecified: Secondary | ICD-10-CM

## 2021-07-10 DIAGNOSIS — J3089 Other allergic rhinitis: Secondary | ICD-10-CM

## 2021-07-10 DIAGNOSIS — R12 Heartburn: Secondary | ICD-10-CM | POA: Diagnosis not present

## 2021-07-10 DIAGNOSIS — I10 Essential (primary) hypertension: Secondary | ICD-10-CM

## 2021-07-10 DIAGNOSIS — M75121 Complete rotator cuff tear or rupture of right shoulder, not specified as traumatic: Secondary | ICD-10-CM

## 2021-07-10 DIAGNOSIS — T782XXD Anaphylactic shock, unspecified, subsequent encounter: Secondary | ICD-10-CM

## 2021-07-10 MED ORDER — EPINEPHRINE 0.3 MG/0.3ML IJ SOAJ: 0.3000 mg | INTRAMUSCULAR | 1 refills | Status: DC | PRN

## 2021-07-10 MED ORDER — AZELASTINE HCL 0.1 % NA SOLN: 1.0000 | Freq: Two times a day (BID) | NASAL | 5 refills | Status: DC | PRN

## 2021-07-10 MED ORDER — SPACER/AERO-HOLDING CHAMBERS DEVI: 1.0000 | Freq: Once | 0 refills | Status: AC

## 2021-07-10 MED ORDER — PREDNISONE 10 MG PO TABS: ORAL_TABLET | ORAL | 0 refills | Status: AC

## 2021-07-10 MED ORDER — FLUTICASONE PROPIONATE HFA 110 MCG/ACT IN AERO: 2.0000 | INHALATION_SPRAY | Freq: Two times a day (BID) | RESPIRATORY_TRACT | 1 refills | Status: DC

## 2021-07-10 MED ORDER — CARBINOXAMINE MALEATE 4 MG PO TABS: ORAL_TABLET | ORAL | 3 refills | Status: DC

## 2021-07-10 MED ORDER — LEVALBUTEROL TARTRATE 45 MCG/ACT IN AERO: 2.0000 | INHALATION_SPRAY | Freq: Four times a day (QID) | RESPIRATORY_TRACT | 1 refills | Status: AC | PRN

## 2021-07-10 MED ORDER — MONTELUKAST SODIUM 10 MG PO TABS: 10.0000 mg | ORAL_TABLET | Freq: Every day | ORAL | 5 refills | Status: DC

## 2021-07-10 NOTE — Telephone Encounter (Signed)
Sara [name] is being seen by her allergist. She will call back to schedule a virtual visit if needed.

## 2021-07-10 NOTE — Progress Notes (Signed)
RE: Sara [name] MRN: [id number] DOB: 10-04-1951 Date of Telemedicine Visit: 07/10/2021  Referring provider: Silverio Decamp Primary care provider: Silverio Decamp, MD  Chief Complaint: Allergic Rhinitis  (Coughing, sneezing, and watery eyes.  Symptoms have been so bad she thought it was covid the test was negative. No relief since last visit. So congestion her voice is horse. ) and Cough (Constant cough with abdominal pain. She tried over the counter robitussin and still has not had any relief)   Telemedicine Follow Up Visit via Telephone: I connected with Sara [name] for a follow up on 07/10/21 by telephone and verified that I am speaking with the correct person using two identifiers.   I discussed the limitations, risks, security and privacy concerns of performing an evaluation and management service by telephone and the availability of in person appointments. I also discussed with the patient that there may be a patient responsible charge related to this service. The patient expressed understanding and agreed to proceed.  Patient is in her car  Provider is at the office.  Visit start time: 11:15 AM Visit end time: 11:55 AM Insurance consent/check in by: Dereck Leep Medical consent and medical assistant/nurse: Diandra D  History of Present Illness: She is a 70 y.o. female, who is being followed for allergic rhinitis, asthma, coughing, heartburn, anaphylaxis due to hymenoptera venom.Her previous allergy office visit was on May 10, 2021 with Dr. Maudie Mercury.   Allergic rhinitis is reported as not well controlled with carbinoxamine 4 mg twice a day as needed, azelastine nasal spray 1 to 2 sprays each nostril twice a day, and Flonase nasal spray 1 spray each nostril twice a day.  She has not used saline nasal rinse as needed due to it making her feel nauseous.  She reports clear rhinorrhea, nasal congestion, clear postnasal drip, raspy voice, watery eyes, and tickly throat.  She  denies any sinus tenderness.  She has not had any sinus infections since we last saw her.  She is interested in starting allergy injections.  Asthma is reported as not well controlled with levoalbuterol as needed.  She reports a dry cough that is worse at night and a heaviness in her chest for which levoalbuterol helps.  She denies any wheezing, tightness in her chest, and shortness of breath.  Since her last office visit she has not required any systemic steroids or made any trips to the emergency room or urgent care due to breathing problems.  This week she has had to use her levoalbuterol inhaler every day.  Prior to that she was using her levoalbuterol inhaler approximately 1-2 times a week.  She reports that her stomach is sore from coughing so much.  She does feel like her cough is due to drainage.  She also mentions that her cough has been worse over the past 2 weeks.She did a COVID-19 test last Wednesday at [facility] and this was negative.  Discussed how coughing could be multifactorial and that it could be due to her being on an ACE inhibitor.  She is interested in starting Flovent first before talking to her primary care physician about stopping her ACE inhibitor.  Reflux is reported as controlled with Dexilant once a day.  She reports that her reflux has been controlled for several years.  She denies any bee stings since her last office visit and has not had to use her EpiPen.  Assessment and Plan: [name] is a 70 y.o. female with: Patient Instructions   Seasonal  and perennial allergic rhinitis Continue environmental control measures directed towards: grass, ragweed, weed pollen, mold, cat, and borderline dog Continue carbinoxamine 4mg  (1 to 1.5 tablet) twice a day as needed. Continue azelastine nasal spray 1-2 sprays per nostril twice a day as needed for runny nose/drainage. May use Flonase (fluticasone) nasal spray 1 spray per nostril twice a day as needed for nasal congestion.  Nasal  saline spray (i.e., Simply Saline) or nasal saline lavage (i.e., NeilMed) is recommended as needed and prior to medicated nasal sprays. Continue Singulair (montelukast) 10mg  daily at night. Recommend starting allergy injections for long term control if above medications do not help the symptoms  May use Mucinex 600 to 1200 mg twice a day as needed for thick postnasal drip.  Make sure and drink plenty of fluids while taking Mucinex  Asthma May use levoalbuterol rescue inhaler 2 puffs or nebulizer every 4 to 6 hours as needed for shortness of breath, chest tightness, coughing, and wheezing. Monitor frequency of use.  Start a 2 month trial of Flovent 110 mcg 2 puffs twice a day with spacer to help prevent cough and wheeze Start prednisone 10 mg taking 1 tablet twice a day for 4 days, then on the fifth day take 1 tablet and stop  Coughing She would like to hold off on stopping Ace Inhibitor for now and try Flovent. Discussed that if cough continues at next appointment we would be discussing this further Get PA/lateral chest x-ray due to cough. We will call you with results once they are back Stop Robitussin The most common causes of chronic cough include the following: upper airway cough syndrome (UACS) which is caused by variety of rhinitis conditions; asthma; gastroesophageal reflux disease (GERD); chronic bronchitis from cigarette smoking or other inhaled environmental irritants; non-asthmatic eosinophilic bronchitis; and bronchiectasis.   Heartburn Continue lifestyle and dietary modifications. Continue Dexilant as prescribed.   Anaphylaxis due to hymenoptera venom Avoid bee stings. In case of an allergic reaction, give Benadryl 4 teaspoonfuls every 4 hours, and if life-threatening symptoms occur, inject with EpiPen 0.3 mg.  If heaviness persists in chest please proceed to the emergency room.  Follow up in 2 months or sooner if needed. Also, schedule an appointment in 2-3 weeks to start  allergy injections.  Return in about 2 months (around 09/10/2021), or if symptoms worsen or fail to improve, for also schedule 2-3 week appointment to start allergy injections.  Meds ordered this encounter  Medications   EPINEPHrine 0.3 mg/0.3 mL IJ SOAJ injection    Sig: Inject 0.3 mg into the muscle as needed for anaphylaxis.    Dispense:  1 each    Refill:  1   fluticasone (FLOVENT HFA) 110 MCG/ACT inhaler    Sig: Inhale 2 puffs into the lungs 2 (two) times daily. With spacer. Rinse mouth after use.    Dispense:  1 each    Refill:  1   predniSONE (DELTASONE) 10 MG tablet    Sig: Take 1 tablet (10 mg total) by mouth 2 (two) times daily for 4 days, THEN 1 tablet (10 mg total) daily for 1 day.    Dispense:  9 tablet    Refill:  0   montelukast (SINGULAIR) 10 MG tablet    Sig: Take 1 tablet (10 mg total) by mouth at bedtime.    Dispense:  30 tablet    Refill:  5   levalbuterol (XOPENEX HFA) 45 MCG/ACT inhaler    Sig: Inhale 2 puffs into the lungs  every 6 (six) hours as needed for wheezing or shortness of breath.    Dispense:  15 each    Refill:  1   Carbinoxamine Maleate 4 MG TABS    Sig: Take 1 to 1.5 tablet twice a day as needed for allergies    Dispense:  60 tablet    Refill:  3   azelastine (ASTELIN) 0.1 % nasal spray    Sig: Place 1-2 sprays into both nostrils 2 (two) times daily as needed (nasal drainage). Use in each nostril as directed    Dispense:  30 mL    Refill:  5   Spacer/Aero-Holding Chambers DEVI    Sig: 1 each by Does not apply route once for 1 dose.    Dispense:  1 each    Refill:  0    Lab Orders  No laboratory test(s) ordered today    Diagnostics: None.  Medication List:  Current Outpatient Medications  Medication Sig Dispense Refill   AMBULATORY NON FORMULARY MEDICATION Medication Name: EMSI "Flex-MT" E-stim unit Use for 30 mins TID 100-150hz  Please include pads.  [id number] Doctor [phone] 1 each 0   amLODipine (NORVASC) 10 MG tablet  Take 1 tablet (10 mg total) by mouth daily. 30 tablet 11   aspirin 81 MG tablet Take 81 mg by mouth daily.     atorvastatin (LIPITOR) 10 MG tablet TAKE 1 TABLET (10 MG TOTAL) BY MOUTH DAILY AT 6 PM. 90 tablet 3   bimatoprost (LATISSE) 0.03 % ophthalmic solution APPLY A SWIPE OF SOLUTION TO UPPER EYELID MARGIN NIGHTLY.     Calcium Carbonate-Vitamin D 600-400 MG-UNIT tablet Take 1 tablet by mouth 2 (two) times daily. 60 tablet 11   DEXILANT 60 MG capsule Take 1 capsule by mouth daily.     diazepam (VALIUM) 5 MG tablet TAKE 1 TABLET (5 MG TOTAL) BY MOUTH DAILY AS NEEDED. FOR ANXIETY 30 tablet 0   EPINEPHrine 0.3 mg/0.3 mL IJ SOAJ injection Inject 0.3 mg into the muscle as needed for anaphylaxis. 1 each 1   fluticasone (FLONASE) 50 MCG/ACT nasal spray Place into the nose.     fluticasone (FLOVENT HFA) 110 MCG/ACT inhaler Inhale 2 puffs into the lungs 2 (two) times daily. With spacer. Rinse mouth after use. 1 each 1   KLOR-CON M20 20 MEQ tablet TAKE 1 TABLET BY MOUTH EVERY DAY 90 tablet 3   linaclotide (LINZESS) 145 MCG CAPS capsule Take by mouth.     lisinopril-hydrochlorothiazide (ZESTORETIC) 20-25 MG tablet Take 1 tablet by mouth daily. 90 tablet 3   metoprolol succinate (TOPROL-XL) 100 MG 24 hr tablet TAKE 1 TABLET BY MOUTH DAILY. TAKE WITH OR IMMEDIATELY FOLLOWING A MEAL. 90 tablet 1   Misc. Devices (ALL-BODY MASSAGE) MISC Full body massage 3 times weekly as needed for pain, this is medically necessary. 1 each 0   montelukast (SINGULAIR) 10 MG tablet Take 1 tablet (10 mg total) by mouth at bedtime. 30 tablet 5   ondansetron (ZOFRAN-ODT) 8 MG disintegrating tablet Take 1 tablet (8 mg total) by mouth every 8 (eight) hours as needed for nausea. 20 tablet 3   predniSONE (DELTASONE) 10 MG tablet Take 1 tablet (10 mg total) by mouth 2 (two) times daily for 4 days, THEN 1 tablet (10 mg total) daily for 1 day. 9 tablet 0   Spacer/Aero-Holding Chambers DEVI 1 each by Does not apply route once for 1 dose. 1  each 0   tiZANidine (ZANAFLEX) 4 MG tablet TAKE 1 TABLET (4  MG TOTAL) BY MOUTH EVERY 8 (EIGHT) HOURS AS NEEDED FOR MUSCLE SPASMS. 30 tablet 2   traMADol (ULTRAM) 50 MG tablet Take 1 tablet (50 mg total) by mouth every 12 (twelve) hours as needed. 60 tablet 3   traZODone (DESYREL) 150 MG tablet TAKE 1 TABLET (150 MG TOTAL) BY MOUTH AT BEDTIME. 90 tablet 3   zolpidem (AMBIEN) 10 MG tablet TAKE 1 TABLET BY MOUTH EVERYDAY AT BEDTIME 90 tablet 0   azelastine (ASTELIN) 0.1 % nasal spray Place 1-2 sprays into both nostrils 2 (two) times daily as needed (nasal drainage). Use in each nostril as directed 30 mL 5   Carbinoxamine Maleate 4 MG TABS Take 1 to 1.5 tablet twice a day as needed for allergies 60 tablet 3   levalbuterol (XOPENEX HFA) 45 MCG/ACT inhaler Inhale 2 puffs into the lungs every 6 (six) hours as needed for wheezing or shortness of breath. 15 each 1   No current facility-administered medications for this visit.   Allergies: Allergies  Allergen Reactions   Bee Venom Anaphylaxis   I reviewed her past medical history, social history, family history, and environmental history and no significant changes have been reported from previous visit on May 10, 2021.  Review of Systems  Constitutional:  Negative for chills and fever.  HENT:         Reports clear rhinorrhea, nasal congestion, watery eyes, clear postnasal drip, and tickly throat  Eyes:         Reports Itchy watery eyes.  Respiratory:  Positive for cough. Negative for shortness of breath and wheezing.        Reports cough that is dry and worse at night, nocturnal awakenings due to cough, and heavy feeling in chest.  Denies wheezing, tightness in chest, and shortness of breath  Cardiovascular:  Negative for chest pain and palpitations.  Gastrointestinal:        Denies heartburn and reflux  Genitourinary:  Negative for dysuria.  Musculoskeletal:  Negative for myalgias.  Skin:  Negative for rash.  Allergic/Immunologic: Positive for  environmental allergies.  Neurological:  Negative for headaches.  Objective: Physical Exam Not obtained as encounter was done via telephone.   Previous notes and tests were reviewed.  I discussed the assessment and treatment plan with the patient. The patient was provided an opportunity to ask questions and all were answered. The patient agreed with the plan and demonstrated an understanding of the instructions.   The patient was advised to call back or seek an in-person evaluation if the symptoms worsen or if the condition fails to improve as anticipated.  I provided 40 minutes of non-face-to-face time during this encounter.  It was my pleasure to participate in [place] care today. Please feel free to contact me with any questions or concerns.   Sincerely,  Althea Charon, FNP

## 2021-07-10 NOTE — Progress Notes (Signed)
Established Patient Office Visit  Subjective:  Patient ID: Sara [name], female    DOB: [birth year]  Age: 70 y.o. MRN: [id number]  CC:  Chief Complaint  Patient presents with   Hypertension    HPI Sara [name] presents for blood pressure check. Denies chest pain, shortness of breath or dizziness.   Past Medical History:  Diagnosis Date   Allergic rhinitis    Anxiety    Asthma    Fibromyalgia    [name]   Hyperlipidemia    Hypertension    Insomnia 2000   Lumbar spondylosis    PONV (postoperative nausea and vomiting)    Postmenopausal bleeding    Vaginal Pap smear, abnormal     Past Surgical History:  Procedure Laterality Date   ADENOIDECTOMY     BREAST BIOPSY  1980   neg   DILATATION & CURETTAGE/HYSTEROSCOPY WITH MYOSURE N/A 02/17/2020   Procedure: DILATATION & CURETTAGE/HYSTEROSCOPY WITH MYOSURE POLYPECTOMY;  Surgeon: Emily Filbert, MD;  Location: [facility];  Service: Gynecology;  Laterality: N/A;  rep will be here   REDUCTION MAMMAPLASTY     TONSILLECTOMY     TOTAL KNEE ARTHROPLASTY Left 2000, 2015    Dr. 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    Has grown children, daughter in [place].   Walks for exercise.   Caffeine- 1 c daily   Social Determinants of Health   Financial Resource Strain: Not on file  Food Insecurity: Not on file  Transportation Needs: Not on file  Physical Activity: Not on file  Stress: Not on file  Social Connections: Not on file  Intimate Partner Violence: Not on file    Outpatient Medications Prior to Visit  Medication Sig Dispense Refill   [place] Medication Name: EMSI "Flex-MT" E-stim unit Use for 30 mins TID 100-150hz  Please include pads.  [id number] Doctor [phone] 1 each 0   amLODipine (NORVASC) 10 MG tablet Take 1 tablet (10 mg total) by mouth daily. 30 tablet 11   aspirin 81 MG tablet Take 81 mg by mouth daily.     atorvastatin (LIPITOR) 10 MG tablet TAKE 1 TABLET (10  MG TOTAL) BY MOUTH DAILY AT 6 PM. 90 tablet 3   azelastine (ASTELIN) 0.1 % nasal spray Place 1-2 sprays into both nostrils 2 (two) times daily as needed (nasal drainage). Use in each nostril as directed 30 mL 5   bimatoprost (LATISSE) 0.03 % ophthalmic solution APPLY A SWIPE OF SOLUTION TO UPPER EYELID MARGIN NIGHTLY.     Calcium Carbonate-Vitamin D 600-400 MG-UNIT tablet Take 1 tablet by mouth 2 (two) times daily. 60 tablet 11   Carbinoxamine Maleate 4 MG TABS Take 1 to 1.5 tablet twice a day as needed for allergies 60 tablet 3   DEXILANT 60 MG capsule Take 1 capsule by mouth daily.     diazepam (VALIUM) 5 MG tablet TAKE 1 TABLET (5 MG TOTAL) BY MOUTH DAILY AS NEEDED. FOR ANXIETY 30 tablet 0   fluticasone (FLONASE) 50 MCG/ACT nasal spray Place into the nose.     KLOR-CON M20 20 MEQ tablet TAKE 1 TABLET BY MOUTH EVERY DAY 90 tablet 3    levalbuterol (XOPENEX HFA) 45 MCG/ACT inhaler INHALE 1-2 PUFFS INTO THE LUNGS EVERY 4 HOURS AS NEEDED FOR WHEEZING/SHORTNESS OF BREATH (COUGHING). 15 each 2   linaclotide (LINZESS) 145 MCG CAPS capsule Take by mouth.     lisinopril-hydrochlorothiazide (ZESTORETIC) 20-25 MG tablet Take 1 tablet by mouth daily. 90 tablet 3   metoprolol succinate (TOPROL-XL) 100 MG 24 hr tablet TAKE 1 TABLET BY MOUTH DAILY. TAKE WITH OR IMMEDIATELY FOLLOWING A MEAL. 90 tablet 1   Misc. Devices (ALL-BODY MASSAGE) MISC Full body massage 3 times weekly as needed for pain, this is medically necessary. 1 each 0   montelukast (SINGULAIR) 10 MG tablet Take 1 tablet (10 mg total) by mouth at bedtime. 30 tablet 5   ondansetron (ZOFRAN-ODT) 8 MG disintegrating tablet Take 1 tablet (8 mg total) by mouth every 8 (eight) hours as needed for nausea. 20 tablet 3   tiZANidine (ZANAFLEX) 4 MG tablet TAKE 1 TABLET (4 MG TOTAL) BY MOUTH EVERY 8 (EIGHT) HOURS AS NEEDED FOR MUSCLE SPASMS. 30 tablet 2   traMADol (ULTRAM) 50 MG tablet Take 1 tablet (50 mg total) by mouth every 12 (twelve) hours as needed. 60 tablet 3   traZODone (DESYREL) 150 MG tablet TAKE 1 TABLET (150 MG TOTAL) BY MOUTH AT BEDTIME. 90 tablet 3   zolpidem (AMBIEN) 10 MG tablet TAKE 1 TABLET BY MOUTH EVERYDAY AT BEDTIME 90 tablet 0   No facility-administered medications prior to visit.    Allergies  Allergen Reactions   Bee Venom Anaphylaxis    ROS Review of Systems    Objective:    Physical Exam  BP 137/83   Pulse 90   SpO2 98%  Wt Readings from Last 3 Encounters:  07/10/21 209 lb (94.8 kg)  05/31/21 213 lb (96.6 kg)  05/10/21 209 lb 9.6 oz (95.1 kg)     There are no preventive care reminders to display for this patient.  There are no preventive care reminders to display for this patient.  Lab Results  Component Value Date   TSH 2.73 03/30/2021   Lab Results  Component Value Date   WBC 9.9 06/23/2021   HGB 13.9 06/23/2021   HCT 41.2  06/23/2021   MCV 94.5 06/23/2021   PLT 370 06/23/2021   Lab Results  Component Value Date   NA 141 06/23/2021   K 4.2 06/23/2021   CO2 32 06/23/2021   GLUCOSE 120 (H) 06/23/2021   BUN 16 06/23/2021   CREATININE 0.95  06/23/2021   BILITOT 0.5 06/23/2021   ALKPHOS 52 09/20/2016   AST 17 06/23/2021   ALT 11 06/23/2021   PROT 7.2 06/23/2021   ALBUMIN 4.5 09/20/2016   CALCIUM 9.9 06/23/2021   GFR 71.55 07/30/2011   Lab Results  Component Value Date   CHOL 156 03/30/2021   Lab Results  Component Value Date   HDL 72 03/30/2021   Lab Results  Component Value Date   LDLCALC 71 03/30/2021   Lab Results  Component Value Date   TRIG 52 03/30/2021   Lab Results  Component Value Date   CHOLHDL 2.2 03/30/2021   Lab Results  Component Value Date   HGBA1C 5.5 03/30/2021      Assessment & Plan:  HTN - Second check of blood pressure was within normal limits. Sara [name] advised to continue with current medications as directed. Follow up in 3 months with Dr [name].   Problem List Items Addressed This Visit     Hypertension - Primary    No orders of the defined types were placed in this encounter.   Follow-up: Return in about 3 months (around 10/10/2021) for HTN with Dr [name]. .    Durene Romans, Monico Blitz, [place]

## 2021-07-11 DIAGNOSIS — M25512 Pain in left shoulder: Secondary | ICD-10-CM | POA: Diagnosis not present

## 2021-07-11 DIAGNOSIS — J302 Other seasonal allergic rhinitis: Secondary | ICD-10-CM | POA: Diagnosis not present

## 2021-07-11 DIAGNOSIS — M25511 Pain in right shoulder: Secondary | ICD-10-CM | POA: Diagnosis not present

## 2021-07-11 NOTE — Progress Notes (Signed)
VIALS MADE. EXP 07-11-22 

## 2021-07-11 NOTE — Progress Notes (Signed)
Aeroallergen Immunotherapy   Ordering Provider: Dr. Rexene Alberts   Patient Details  Name: Sara [name]  MRN: [id number]  Date of Birth: 08-24-1951   Order 1 of 2   Vial Label: G-[id number]   0.3 ml (Volume)  BAU Concentration -- 7 Grass Mix* 100,000 ([address] [place], [place], [place], [place], [place], [place], [place])  0.2 ml (Volume)  1:20 Concentration -- [place]  0.3 ml (Volume)  1:20 Concentration -- Ragweed Mix  0.5 ml (Volume)  1:20 Concentration -- Weed Mix*    1.3  ml Extract Subtotal  3.7  ml Diluent  5.0  ml Maintenance Total   Schedule:  B  Blue Vial (1:100,000): Schedule B (6 doses)  Yellow Vial (1:10,000): Schedule B (6 doses)  Green Vial (1:1,000): Schedule B (6 doses)  Red Vial (1:100): Schedule A (10 doses)   Special Instructions: once per week

## 2021-07-11 NOTE — Progress Notes (Signed)
Aeroallergen Immunotherapy   Ordering Provider: Dr. Rexene Alberts   Patient Details  Name: Sara [name]  MRN: [id number]  Date of Birth: [birth year]   Order 2 of 2   Vial Label: M-C-D   0.2 ml (Volume)  1:20 Concentration -- Bipolaris sorokiniana  0.2 ml (Volume)  1:20 Concentration -- Drechslera spicifera  0.2 ml (Volume)  1:10 Concentration -- Mucor plumbeus  0.5 ml (Volume)  1:10 Concentration -- Cat Hair  0.5 ml (Volume)  1:10 Concentration -- Dog Epithelia    1.6  ml Extract Subtotal  3.4  ml Diluent  5.0  ml Maintenance Total   Schedule:  B  Blue Vial (1:100,000): Schedule B (6 doses)  Yellow Vial (1:10,000): Schedule B (6 doses)  Green Vial (1:1,000): Schedule B (6 doses)  Red Vial (1:100): Schedule A (10 doses)   Special Instructions: once per week

## 2021-07-12 ENCOUNTER — Ambulatory Visit (INDEPENDENT_AMBULATORY_CARE_PROVIDER_SITE_OTHER): Payer: Medicare PPO

## 2021-07-12 ENCOUNTER — Other Ambulatory Visit: Payer: Self-pay

## 2021-07-12 DIAGNOSIS — R059 Cough, unspecified: Secondary | ICD-10-CM

## 2021-07-12 DIAGNOSIS — J3081 Allergic rhinitis due to animal (cat) (dog) hair and dander: Secondary | ICD-10-CM | POA: Diagnosis not present

## 2021-07-13 NOTE — Progress Notes (Signed)
Noted! Thank you

## 2021-07-13 NOTE — Progress Notes (Signed)
Please let Sara [name] know that her chest x-ray is essentially normal and does not show any infection

## 2021-07-14 NOTE — Progress Notes (Signed)
Noted! Thank you

## 2021-07-18 ENCOUNTER — Encounter: Payer: Self-pay | Admitting: Sports Medicine

## 2021-07-18 ENCOUNTER — Other Ambulatory Visit: Payer: Self-pay

## 2021-07-18 ENCOUNTER — Ambulatory Visit (INDEPENDENT_AMBULATORY_CARE_PROVIDER_SITE_OTHER): Payer: Medicare PPO | Admitting: Sports Medicine

## 2021-07-18 DIAGNOSIS — M75121 Complete rotator cuff tear or rupture of right shoulder, not specified as traumatic: Secondary | ICD-10-CM

## 2021-07-18 DIAGNOSIS — I10 Essential (primary) hypertension: Secondary | ICD-10-CM

## 2021-07-18 DIAGNOSIS — M47812 Spondylosis without myelopathy or radiculopathy, cervical region: Secondary | ICD-10-CM | POA: Diagnosis not present

## 2021-07-18 DIAGNOSIS — M26623 Arthralgia of bilateral temporomandibular joint: Secondary | ICD-10-CM | POA: Diagnosis not present

## 2021-07-18 MED ORDER — TRAMADOL HCL 50 MG PO TABS: 50.0000 mg | ORAL_TABLET | Freq: Three times a day (TID) | ORAL | 1 refills | Status: DC | PRN

## 2021-07-18 NOTE — Assessment & Plan Note (Signed)
MRI shows rotator cuff tearing, I did refer her to shoulder surgery, adding some tramadol for pain relief until then. Return as needed.

## 2021-07-18 NOTE — Assessment & Plan Note (Signed)
Most recent nurse visit blood pressure check was normal, it is high today without any symptoms of endorgan damage, she does endorse that she does not take her blood pressure medications on days that she is going to drive, I advised her she should take her blood pressure medication every day and avoid muscle relaxers and pain medication on days she drives. She has not taken blood pressure medication today.

## 2021-07-18 NOTE — Progress Notes (Signed)
    Procedures performed today:    None.  Independent interpretation of notes and tests performed by another provider:   None.  Brief History, Exam, Impression, and Recommendations:    Hypertension Most recent nurse visit blood pressure check was normal, it is high today without any symptoms of endorgan damage, she does endorse that she does not take her blood pressure medications on days that she is going to drive, I advised her she should take her blood pressure medication every day and avoid muscle relaxers and pain medication on days she drives. She has not taken blood pressure medication today.  Rotator cuff tear, right MRI shows rotator cuff tearing, I did refer her to shoulder surgery, adding some tramadol for pain relief until then. Return as needed.    ___________________________________________ [name]. [name], M.D., ABFM., CAQSM. Primary Care and [place] Instructor of [place] of [facility] of Medicine

## 2021-07-20 ENCOUNTER — Encounter: Payer: Self-pay | Admitting: Physician Assistant

## 2021-07-20 ENCOUNTER — Encounter (HOSPITAL_BASED_OUTPATIENT_CLINIC_OR_DEPARTMENT_OTHER): Payer: Self-pay | Admitting: Orthopedic Surgery

## 2021-07-20 DIAGNOSIS — M25511 Pain in right shoulder: Secondary | ICD-10-CM | POA: Diagnosis not present

## 2021-07-20 DIAGNOSIS — M75121 Complete rotator cuff tear or rupture of right shoulder, not specified as traumatic: Secondary | ICD-10-CM | POA: Insufficient documentation

## 2021-07-20 DIAGNOSIS — M7542 Impingement syndrome of left shoulder: Secondary | ICD-10-CM

## 2021-07-20 HISTORY — DX: Impingement syndrome of left shoulder: M75.42

## 2021-07-20 NOTE — H&P (Signed)
Sara [name] is an 70 y.o. female.   Chief Complaint: right shoulder large rotator cuff tear left shoulder tendonitis HPI: She has a year and a half history of progressive onset of right upper extremity pain.  The patient lives in the area, went to high school here, but lives in [place].  She has significant right shoulder pain.  She has difficulty with the shoulder.  She has already been worked up.  She has a full width, full thickness tear, which is very symptomatic on the right side.  She has moderate impingement from the [facility] joint, as well as from the edge of the acromion.  We reviewed all films and MRI.  Left shoulder is moderately symptomatic.  It has not had much of a workup, but it is starting to hurt, although not nearly to the extent of the right side.  Exam shows mild restriction of motion.  Positive empty can test.  She has 4-/5 abduction strength with crepitus in the shoulder.  Tenderness over the [facility] joint.  Positive impingement sign.  We reviewed all films and studies.  She is symptomatic.  Full thickness, full width tear of the rotator cuff with AC arthritis and impingement.   I recommend arthroscopy, acromioplasty, limited debridement with open rotator cuff repair.  Distal clavicle relatively healthy.  Risks and benefits discussed with the patient.    Past Medical History:  Diagnosis Date   Allergic rhinitis    Anxiety    Asthma    Complete rotator cuff tear or rupture of right shoulder, not specified as traumatic    Fibromyalgia    [name]   Hyperlipidemia    Hypertension    Insomnia 2000   Lumbar spondylosis    PONV (postoperative nausea and vomiting)    Postmenopausal bleeding    Rotator cuff impingement syndrome of left shoulder 07/20/2021   Vaginal Pap smear, abnormal     Past Surgical History:  Procedure Laterality Date   ADENOIDECTOMY     BREAST BIOPSY  1980   neg   [place] N/A 02/17/2020   Procedure: DILATATION &  CURETTAGE/HYSTEROSCOPY WITH MYOSURE POLYPECTOMY;  Surgeon: Emily Filbert, MD;  Location: [facility];  Service: Gynecology;  Laterality: N/A;  rep will be here   REDUCTION MAMMAPLASTY     TONSILLECTOMY     TOTAL KNEE ARTHROPLASTY Left 2000, 2015    Dr. Tonita Cong    Family History  Problem Relation Age of Onset   Diabetes Mother    Heart attack Mother        First MI at age 59   Diabetes type II Mother    Coronary artery disease Mother    Hypertension Father    Diabetes Father    Alcohol abuse Father    Pancreatitis Father    Asthma Daughter    Asthma Grandchild    Social History:  reports that she has never smoked. She has never used smokeless tobacco. She reports current alcohol use. She reports that she does not use drugs.  Allergies:  Allergies  Allergen Reactions   Bee Venom Anaphylaxis    No medications prior to admission.    No results found for this or any previous visit (from the past 48 hour(s)). No results found.  Review of Systems  Constitutional: Negative.   HENT: Negative.    Eyes: Negative.   Respiratory: Negative.    Cardiovascular: Negative.   Gastrointestinal: Negative.   Endocrine: Negative.   Genitourinary: Negative.  Musculoskeletal:  Positive for joint swelling, myalgias and neck pain.  Skin: Negative.   Allergic/Immunologic: Negative.   Neurological: Negative.   Hematological: Negative.   Psychiatric/Behavioral: Negative.     There were no vitals taken for this visit. Physical Exam HENT:     Head: Normocephalic.     Right Ear: External ear normal.     Left Ear: External ear normal.     Mouth/Throat:     Mouth: Mucous membranes are moist.  Eyes:     Conjunctiva/sclera: Conjunctivae normal.  Cardiovascular:     Rate and Rhythm: Normal rate.  Pulmonary:     Effort: Pulmonary effort is normal.  Abdominal:     Palpations: Abdomen is soft.  Genitourinary:    Comments: Not pertinent to current symptomatology therefore not  examined.  Musculoskeletal:     Cervical back: Neck supple.     Comments: Right shoulder limited range of motion with significant weakness and pain.  2+ radial pulses.  Left shoulder also has limited range of motion, minimal weakness and moderate pain.  2 + radial pulse  Skin:    General: Skin is warm.     Capillary Refill: Capillary refill takes less than 2 seconds.  Neurological:     General: No focal deficit present.     Mental Status: She is alert.  Psychiatric:        Mood and Affect: Mood normal.     Assessment Principal Problem:   Complete rotator cuff tear or rupture of right shoulder, not specified as traumatic Active Problems:   Prediabetes   Obstructive sleep apnea   Rotator cuff tear, right   Rotator cuff impingement syndrome of left shoulder   Plan Right shoulder arthroscopy, open rotator cuff repair with subacromial decompression and open distal clavicle excision.  Left shoulder rotator cuff impingement syndrome. The risks, benefits, and possible complications of the procedure were discussed in detail with the patient.  The patient is without question.   Everett Ricciardelli Earl Lagos, PA-C 07/20/2021, 12:41 PM

## 2021-07-24 ENCOUNTER — Other Ambulatory Visit: Payer: Self-pay

## 2021-07-24 ENCOUNTER — Encounter (HOSPITAL_BASED_OUTPATIENT_CLINIC_OR_DEPARTMENT_OTHER): Payer: Self-pay | Admitting: Orthopedic Surgery

## 2021-07-24 ENCOUNTER — Encounter (HOSPITAL_BASED_OUTPATIENT_CLINIC_OR_DEPARTMENT_OTHER)
Admission: RE | Admit: 2021-07-24 | Discharge: 2021-07-24 | Disposition: A | Discharge status: Home / Self Care | Payer: Medicare PPO | Source: Ambulatory Visit | Attending: Orthopedic Surgery | Admitting: Orthopedic Surgery

## 2021-07-24 DIAGNOSIS — Z20822 Contact with and (suspected) exposure to covid-19: Secondary | ICD-10-CM | POA: Insufficient documentation

## 2021-07-24 DIAGNOSIS — Z01818 Encounter for other preprocedural examination: Secondary | ICD-10-CM | POA: Insufficient documentation

## 2021-07-24 LAB — BASIC METABOLIC PANEL
Anion gap: 7 (ref 5–15)
BUN: 15 mg/dL (ref 8–23)
CO2: 27 mmol/L (ref 22–32)
Calcium: 9.9 mg/dL (ref 8.9–10.3)
Chloride: 105 mmol/L (ref 98–111)
Creatinine, Ser: 0.99 mg/dL (ref 0.44–1.00)
GFR, Estimated: >60 mL/min (ref >60)
Glucose, Bld: 108 mg/dL — ABNORMAL HIGH (ref 70–99)
Potassium: 4.3 mmol/L (ref 3.5–5.1)
Sodium: 139 mmol/L (ref 135–145)

## 2021-07-24 NOTE — Progress Notes (Signed)

## 2021-07-25 ENCOUNTER — Other Ambulatory Visit: Payer: Self-pay

## 2021-07-25 DIAGNOSIS — F418 Other specified anxiety disorders: Secondary | ICD-10-CM

## 2021-07-25 MED ORDER — DIAZEPAM 5 MG PO TABS: 5.0000 mg | ORAL_TABLET | Freq: Three times a day (TID) | ORAL | 0 refills | Status: DC | PRN

## 2021-07-27 ENCOUNTER — Encounter (HOSPITAL_BASED_OUTPATIENT_CLINIC_OR_DEPARTMENT_OTHER): Payer: Self-pay | Admitting: Orthopedic Surgery

## 2021-07-28 ENCOUNTER — Ambulatory Visit (HOSPITAL_BASED_OUTPATIENT_CLINIC_OR_DEPARTMENT_OTHER): Payer: Medicare PPO | Admitting: Certified Registered"

## 2021-07-28 ENCOUNTER — Other Ambulatory Visit: Payer: Self-pay

## 2021-07-28 ENCOUNTER — Encounter (HOSPITAL_BASED_OUTPATIENT_CLINIC_OR_DEPARTMENT_OTHER): Payer: Self-pay | Admitting: Orthopedic Surgery

## 2021-07-28 ENCOUNTER — Encounter (HOSPITAL_BASED_OUTPATIENT_CLINIC_OR_DEPARTMENT_OTHER)
Admission: RE | Disposition: A | Discharge status: Home / Self Care | Payer: Self-pay | Source: Home / Self Care | Attending: Orthopedic Surgery

## 2021-07-28 ENCOUNTER — Ambulatory Visit (HOSPITAL_BASED_OUTPATIENT_CLINIC_OR_DEPARTMENT_OTHER)
Admission: RE | Admit: 2021-07-28 | Discharge: 2021-07-28 | Disposition: A | Discharge status: Home / Self Care | Payer: Medicare PPO | Attending: Orthopedic Surgery | Admitting: Orthopedic Surgery

## 2021-07-28 DIAGNOSIS — M19011 Primary osteoarthritis, right shoulder: Secondary | ICD-10-CM | POA: Insufficient documentation

## 2021-07-28 DIAGNOSIS — G4733 Obstructive sleep apnea (adult) (pediatric): Secondary | ICD-10-CM | POA: Diagnosis not present

## 2021-07-28 DIAGNOSIS — M75101 Unspecified rotator cuff tear or rupture of right shoulder, not specified as traumatic: Secondary | ICD-10-CM | POA: Diagnosis not present

## 2021-07-28 DIAGNOSIS — M75121 Complete rotator cuff tear or rupture of right shoulder, not specified as traumatic: Secondary | ICD-10-CM | POA: Diagnosis present

## 2021-07-28 DIAGNOSIS — M7541 Impingement syndrome of right shoulder: Secondary | ICD-10-CM | POA: Diagnosis not present

## 2021-07-28 DIAGNOSIS — M24111 Other articular cartilage disorders, right shoulder: Secondary | ICD-10-CM | POA: Diagnosis not present

## 2021-07-28 DIAGNOSIS — M25812 Other specified joint disorders, left shoulder: Secondary | ICD-10-CM | POA: Diagnosis not present

## 2021-07-28 DIAGNOSIS — Z91030 Bee allergy status: Secondary | ICD-10-CM | POA: Diagnosis not present

## 2021-07-28 DIAGNOSIS — M7542 Impingement syndrome of left shoulder: Secondary | ICD-10-CM | POA: Diagnosis present

## 2021-07-28 DIAGNOSIS — R7303 Prediabetes: Secondary | ICD-10-CM | POA: Diagnosis present

## 2021-07-28 DIAGNOSIS — G8918 Other acute postprocedural pain: Secondary | ICD-10-CM | POA: Diagnosis not present

## 2021-07-28 DIAGNOSIS — M19012 Primary osteoarthritis, left shoulder: Secondary | ICD-10-CM | POA: Diagnosis not present

## 2021-07-28 HISTORY — DX: Complete rotator cuff tear or rupture of right shoulder, not specified as traumatic: M75.121

## 2021-07-28 HISTORY — DX: Gastro-esophageal reflux disease without esophagitis: K21.9

## 2021-07-28 HISTORY — PX: SHOULDER ARTHROSCOPY WITH OPEN ROTATOR CUFF REPAIR AND DISTAL CLAVICLE ACROMINECTOMY: SHX5683

## 2021-07-28 HISTORY — DX: Obstructive sleep apnea (adult) (pediatric): G47.33

## 2021-07-28 SURGERY — SHOULDER ARTHROSCOPY WITH OPEN ROTATOR CUFF REPAIR AND DISTAL CLAVICLE ACROMINECTOMY
Anesthesia: General | Site: Shoulder | Laterality: Right

## 2021-07-28 MED ORDER — POVIDONE-IODINE 10 % EX SWAB
2.0000 | Freq: Once | CUTANEOUS | Status: DC
Start: 2021-07-28 — End: 2021-07-28

## 2021-07-28 MED ORDER — METHYLPREDNISOLONE ACETATE 40 MG/ML IJ SUSP
INTRAMUSCULAR | Status: AC
  Filled 2021-07-28: qty 1

## 2021-07-28 MED ORDER — DEXAMETHASONE SODIUM PHOSPHATE 10 MG/ML IJ SOLN
8.0000 mg | Freq: Once | INTRAMUSCULAR | Status: DC
Start: 2021-07-28 — End: 2021-07-28

## 2021-07-28 MED ORDER — POVIDONE-IODINE 7.5 % EX SOLN: Freq: Once | CUTANEOUS | Status: DC

## 2021-07-28 MED ORDER — BUPIVACAINE HCL (PF) 0.5 % IJ SOLN
INTRAMUSCULAR | Status: DC | PRN
  Administered 2021-07-28: 15 mL via PERINEURAL

## 2021-07-28 MED ORDER — ONDANSETRON HCL 4 MG/2ML IJ SOLN
4.0000 mg | Freq: Once | INTRAMUSCULAR | Status: AC | PRN
  Administered 2021-07-28: 4 mg via INTRAVENOUS

## 2021-07-28 MED ORDER — ACETAMINOPHEN 160 MG/5ML PO SOLN: 325.0000 mg | ORAL | Status: DC | PRN

## 2021-07-28 MED ORDER — LACTATED RINGERS IV SOLN: INTRAVENOUS | Status: DC

## 2021-07-28 MED ORDER — BUPIVACAINE LIPOSOME 1.3 % IJ SUSP
INTRAMUSCULAR | Status: DC | PRN
  Administered 2021-07-28: 10 mL via PERINEURAL

## 2021-07-28 MED ORDER — ACETAMINOPHEN 325 MG PO TABS: 325.0000 mg | ORAL_TABLET | ORAL | Status: DC | PRN

## 2021-07-28 MED ORDER — MIDAZOLAM HCL 2 MG/2ML IJ SOLN
2.0000 mg | Freq: Once | INTRAMUSCULAR | Status: AC
  Administered 2021-07-28: 2 mg via INTRAVENOUS

## 2021-07-28 MED ORDER — FENTANYL CITRATE (PF) 100 MCG/2ML IJ SOLN
INTRAMUSCULAR | Status: DC | PRN
  Administered 2021-07-28: 50 ug via INTRAVENOUS

## 2021-07-28 MED ORDER — SODIUM CHLORIDE 0.9 % IR SOLN
Status: DC | PRN
  Administered 2021-07-28: 2000 mL

## 2021-07-28 MED ORDER — SUGAMMADEX SODIUM 200 MG/2ML IV SOLN
INTRAVENOUS | Status: DC | PRN
  Administered 2021-07-28: 200 mg via INTRAVENOUS

## 2021-07-28 MED ORDER — PHENYLEPHRINE HCL-NACL 10-0.9 MG/250ML-% IV SOLN
INTRAVENOUS | Status: DC | PRN
  Administered 2021-07-28: 40 ug/min via INTRAVENOUS

## 2021-07-28 MED ORDER — BUPIVACAINE-EPINEPHRINE (PF) 0.5% -1:200000 IJ SOLN
INTRAMUSCULAR | Status: AC
  Filled 2021-07-28: qty 30

## 2021-07-28 MED ORDER — CEFAZOLIN SODIUM-DEXTROSE 2-4 GM/100ML-% IV SOLN
INTRAVENOUS | Status: AC
  Filled 2021-07-28: qty 100

## 2021-07-28 MED ORDER — FENTANYL CITRATE (PF) 100 MCG/2ML IJ SOLN
INTRAMUSCULAR | Status: AC
  Filled 2021-07-28: qty 2

## 2021-07-28 MED ORDER — METHYLPREDNISOLONE ACETATE 80 MG/ML IJ SUSP
INTRAMUSCULAR | Status: AC
  Filled 2021-07-28: qty 1

## 2021-07-28 MED ORDER — PHENYLEPHRINE HCL (PRESSORS) 10 MG/ML IV SOLN
INTRAVENOUS | Status: AC
  Filled 2021-07-28: qty 1

## 2021-07-28 MED ORDER — DEXAMETHASONE SODIUM PHOSPHATE 4 MG/ML IJ SOLN
INTRAMUSCULAR | Status: DC | PRN
Start: 2021-07-28 — End: 2021-07-28
  Administered 2021-07-28: 4 mg via INTRAVENOUS

## 2021-07-28 MED ORDER — OXYCODONE HCL 5 MG PO TABS: 5.0000 mg | ORAL_TABLET | Freq: Once | ORAL | Status: DC | PRN

## 2021-07-28 MED ORDER — FENTANYL CITRATE (PF) 100 MCG/2ML IJ SOLN
100.0000 ug | Freq: Once | INTRAMUSCULAR | Status: AC
  Administered 2021-07-28: 100 ug via INTRAVENOUS

## 2021-07-28 MED ORDER — LIDOCAINE HCL (CARDIAC) PF 100 MG/5ML IV SOSY
PREFILLED_SYRINGE | INTRAVENOUS | Status: DC | PRN
  Administered 2021-07-28: 30 mg via INTRAVENOUS

## 2021-07-28 MED ORDER — OXYCODONE HCL 5 MG/5ML PO SOLN: 5.0000 mg | Freq: Once | ORAL | Status: DC | PRN

## 2021-07-28 MED ORDER — CEFAZOLIN SODIUM-DEXTROSE 2-4 GM/100ML-% IV SOLN
2.0000 g | INTRAVENOUS | Status: AC
  Administered 2021-07-28: 2 g via INTRAVENOUS

## 2021-07-28 MED ORDER — MIDAZOLAM HCL 2 MG/2ML IJ SOLN
INTRAMUSCULAR | Status: AC
  Filled 2021-07-28: qty 2

## 2021-07-28 MED ORDER — METHYLPREDNISOLONE ACETATE 40 MG/ML IJ SUSP
INTRAMUSCULAR | Status: DC | PRN
  Administered 2021-07-28 (×2): 5 mL via INTRA_ARTICULAR

## 2021-07-28 MED ORDER — PROPOFOL 500 MG/50ML IV EMUL
INTRAVENOUS | Status: AC
  Filled 2021-07-28: qty 100

## 2021-07-28 MED ORDER — MEPERIDINE HCL 25 MG/ML IJ SOLN: 6.2500 mg | INTRAMUSCULAR | Status: DC | PRN

## 2021-07-28 MED ORDER — ROCURONIUM BROMIDE 100 MG/10ML IV SOLN
INTRAVENOUS | Status: DC | PRN
  Administered 2021-07-28: 60 mg via INTRAVENOUS

## 2021-07-28 MED ORDER — OXYCODONE HCL 5 MG PO TABS: ORAL_TABLET | ORAL | 0 refills | Status: DC

## 2021-07-28 MED ORDER — FENTANYL CITRATE (PF) 100 MCG/2ML IJ SOLN: 25.0000 ug | INTRAMUSCULAR | Status: DC | PRN

## 2021-07-28 MED ORDER — PROPOFOL 10 MG/ML IV BOLUS
INTRAVENOUS | Status: DC | PRN
  Administered 2021-07-28: 150 mg via INTRAVENOUS

## 2021-07-28 SURGICAL SUPPLY — 81 items
AID PSTN UNV HD RSTRNT DISP (MISCELLANEOUS) ×1
ANCH SUT 2 19.1 W/TIGERTAPE (Anchor) ×1 IMPLANT
ANCH SUT SWLK 19.1X4.75 (Anchor) ×1 IMPLANT
ANCHOR SL BIO 4.75 W/TIGERTAPE (Anchor) ×2 IMPLANT
ANCHOR SUT BIO SW 4.75X19.1 (Anchor) ×2 IMPLANT
APL SKNCLS STERI-STRIP NONHPOA (GAUZE/BANDAGES/DRESSINGS) ×1
BENZOIN TINCTURE PRP APPL 2/3 (GAUZE/BANDAGES/DRESSINGS) ×2 IMPLANT
BLADE AVERAGE 25X9 (BLADE) ×2 IMPLANT
BLADE SURG 15 STRL LF DISP TIS (BLADE) ×1 IMPLANT
BLADE SURG 15 STRL SS (BLADE) ×2
BUR EGG 3PK/BX (BURR) ×2 IMPLANT
BURR OVAL 8 FLU 4.0X13 (MISCELLANEOUS) ×2 IMPLANT
CANNULA 5.75X7 CRYSTAL CLEAR (CANNULA) ×2 IMPLANT
CANNULA SHOULDER 7CM (CANNULA) ×2 IMPLANT
CANNULA TWIST IN 8.25X7CM (CANNULA) IMPLANT
CLEANER CAUTERY TIP 5X5 PAD (MISCELLANEOUS) IMPLANT
DECANTER SPIKE VIAL GLASS SM (MISCELLANEOUS) IMPLANT
DISSECTOR  3.8MM X 13CM (MISCELLANEOUS) ×2
DISSECTOR 3.8MM X 13CM (MISCELLANEOUS) ×1 IMPLANT
DISSECTOR 4.0MM X 13CM (MISCELLANEOUS) IMPLANT
DRAPE STERI 35X30 U-POUCH (DRAPES) ×2 IMPLANT
DRAPE SURG 17X23 STRL (DRAPES) ×2 IMPLANT
DRAPE U-SHAPE 76X120 STRL (DRAPES) ×4 IMPLANT
DRSG EMULSION OIL 3X3 NADH (GAUZE/BANDAGES/DRESSINGS) ×2 IMPLANT
DRSG PAD ABDOMINAL 8X10 ST (GAUZE/BANDAGES/DRESSINGS) ×2 IMPLANT
DURAPREP 26ML APPLICATOR (WOUND CARE) ×2 IMPLANT
ELECT REM PT RETURN 9FT ADLT (ELECTROSURGICAL) ×2
ELECTRODE REM PT RTRN 9FT ADLT (ELECTROSURGICAL) ×1 IMPLANT
GAUZE SPONGE 4X4 12PLY STRL (GAUZE/BANDAGES/DRESSINGS) ×2 IMPLANT
GLOVE SRG 8 PF TXTR STRL LF DI (GLOVE) ×2 IMPLANT
GLOVE SURG ENC MOIS LTX SZ7.5 (GLOVE) ×2 IMPLANT
GLOVE SURG ORTHO LTX SZ8 (GLOVE) ×2 IMPLANT
GLOVE SURG UNDER POLY LF SZ6.5 (GLOVE) ×4 IMPLANT
GLOVE SURG UNDER POLY LF SZ8 (GLOVE) ×4
GOWN STRL REUS W/ TWL LRG LVL3 (GOWN DISPOSABLE) ×1 IMPLANT
GOWN STRL REUS W/ TWL XL LVL3 (GOWN DISPOSABLE) ×2 IMPLANT
GOWN STRL REUS W/TWL LRG LVL3 (GOWN DISPOSABLE) ×2
GOWN STRL REUS W/TWL XL LVL3 (GOWN DISPOSABLE) ×6 IMPLANT
MANIFOLD NEPTUNE II (INSTRUMENTS) ×2 IMPLANT
NEEDLE 1/2 CIR CATGUT .05X1.09 (NEEDLE) IMPLANT
NEEDLE HYPO 22GX1.5 SAFETY (NEEDLE) ×2 IMPLANT
NEEDLE SCORPION MULTI FIRE (NEEDLE) ×2 IMPLANT
NS IRRIG 1000ML POUR BTL (IV SOLUTION) ×2 IMPLANT
PACK ARTHROSCOPY DSU (CUSTOM PROCEDURE TRAY) ×2 IMPLANT
PACK BASIN DAY SURGERY FS (CUSTOM PROCEDURE TRAY) ×2 IMPLANT
PAD CLEANER CAUTERY TIP 5X5 (MISCELLANEOUS)
PAD ORTHO SHOULDER 7X19 LRG (SOFTGOODS) ×2 IMPLANT
PENCIL SMOKE EVACUATOR (MISCELLANEOUS) ×2 IMPLANT
PORT APPOLLO RF 90DEGREE MULTI (SURGICAL WAND) IMPLANT
RESTRAINT HEAD UNIVERSAL NS (MISCELLANEOUS) ×2 IMPLANT
SLEEVE SCD COMPRESS KNEE MED (STOCKING) ×2 IMPLANT
SLING ARM FOAM STRAP LRG (SOFTGOODS) IMPLANT
SLING ULTRA II MEDIUM (SOFTGOODS) IMPLANT
SLING ULTRA II SMALL (SOFTGOODS) IMPLANT
SPONGE T-LAP 4X18 ~~LOC~~+RFID (SPONGE) ×2 IMPLANT
STAPLER VISISTAT 35W (STAPLE) IMPLANT
STRIP CLOSURE SKIN 1/2X4 (GAUZE/BANDAGES/DRESSINGS) ×2 IMPLANT
SUCTION FRAZIER HANDLE 10FR (MISCELLANEOUS) ×2
SUCTION TUBE FRAZIER 10FR DISP (MISCELLANEOUS) ×1 IMPLANT
SUT BONE WAX W31G (SUTURE) IMPLANT
SUT ETHILON 3 0 PS 1 (SUTURE) ×2 IMPLANT
SUT FIBERWIRE #2 38 T-5 BLUE (SUTURE)
SUT MNCRL AB 3-0 PS2 18 (SUTURE) ×2 IMPLANT
SUT PROLENE 3 0 PS 2 (SUTURE) IMPLANT
SUT TICRON 1 T 12 (SUTURE) ×4 IMPLANT
SUT TIGER TAPE 7 IN WHITE (SUTURE) IMPLANT
SUT VIC AB 0 CT1 27 (SUTURE)
SUT VIC AB 0 CT1 27XBRD ANBCTR (SUTURE) IMPLANT
SUT VIC AB 1 CT1 27 (SUTURE) ×2
SUT VIC AB 1 CT1 27XBRD ANBCTR (SUTURE) ×1 IMPLANT
SUT VIC AB 2-0 SH 27 (SUTURE) ×2
SUT VIC AB 2-0 SH 27XBRD (SUTURE) ×1 IMPLANT
SUTURE FIBERWR #2 38 T-5 BLUE (SUTURE) IMPLANT
SUTURE TAPE 1.3 40 TPR END (SUTURE) ×1 IMPLANT
SUTURETAPE 1.3 40 TPR END (SUTURE) ×2
SYR BULB EAR ULCER 3OZ GRN STR (SYRINGE) ×2 IMPLANT
TAPE FIBER 2MM 7IN #2 BLUE (SUTURE) IMPLANT
TOWEL GREEN STERILE FF (TOWEL DISPOSABLE) ×2 IMPLANT
TUBING ARTHROSCOPY IRRIG 16FT (MISCELLANEOUS) ×2 IMPLANT
WATER STERILE IRR 1000ML POUR (IV SOLUTION) ×2 IMPLANT
YANKAUER SUCT BULB TIP NO VENT (SUCTIONS) IMPLANT

## 2021-07-28 NOTE — Transfer of Care (Signed)
Immediate Anesthesia Transfer of Care Note  Patient: Sara [name]  Procedure(s) Performed: SHOULDER ARTHROSCOPY WITH OPEN ROTATOR CUFF REPAIR, ACROMIOPLASTY AND DISTAL CLAVICLE ARTHROSCOPIC DEBRIDEMENT, LEFT SHOULDER INJECTION (Right: Shoulder)  Patient Location: PACU  Anesthesia Type:GA combined with regional for post-op pain  Level of Consciousness: awake, alert , oriented and patient cooperative  Airway & Oxygen Therapy: Patient Spontanous Breathing and Patient connected to face mask oxygen  Post-op Assessment: Report given to RN and Post -op Vital signs reviewed and stable  Post vital signs: Reviewed and stable  Last Vitals:  Vitals Value Taken Time  BP    Temp    Pulse    Resp    SpO2      Last Pain:  Vitals:   07/28/21 0817  TempSrc: Oral  PainSc: 0-No pain      Patients Stated Pain Goal: 4 ([phone])  Complications: No notable events documented.

## 2021-07-28 NOTE — Discharge Instructions (Addendum)
Diet: As you were doing prior to hospitalization   Activity: Increase activity slowly as tolerated  No lifting or driving for 4 weeks   Shower: May shower without a dressing on post op day #3, NO SOAKING in tub   Dressing: You may change your dressing on post op day #3.  Then change the dressing daily with sterile 4"x4"s gauze dressing   Weight Bearing: nonweight bearing surgical arm. In sling.  To prevent constipation: you may use a stool softener such as -  Colace ( over the counter) 100 mg by mouth twice a day  Drink plenty of fluids ( prune juice may be helpful) and high fiber foods  Miralax ( over the counter) for constipation as needed.   Precautions: If you experience chest pain or shortness of breath - call 911 immediately For transfer to the hospital emergency department!!  If you develop a fever greater that 101 F, purulent drainage from wound, increased redness or drainage from wound, or calf pain -- Call the office   Follow- Up Appointment: Please call for an appointment to be seen in 1 week or as previously scheduled  [facility] - [phone]    Post Anesthesia Home Care Instructions  Activity: Get plenty of rest for the remainder of the day. A responsible individual must stay with you for 24 hours following the procedure.  For the next 24 hours, DO NOT: -Drive a car -Paediatric nurse -Drink alcoholic beverages -Take any medication unless instructed by your physician -Make any legal decisions or sign important papers.  Meals: Start with liquid foods such as gelatin or soup. Progress to regular foods as tolerated. Avoid greasy, spicy, heavy foods. If nausea and/or vomiting occur, drink only clear liquids until the nausea and/or vomiting subsides. Call your physician if vomiting continues.  Special Instructions/Symptoms: Your throat may feel dry or sore from the anesthesia or the breathing tube placed in your throat during surgery. If this causes discomfort,  gargle with warm salt water. The discomfort should disappear within 24 hours.  If you had a scopolamine patch placed behind your ear for the management of post- operative nausea and/or vomiting:  1. The medication in the patch is effective for 72 hours, after which it should be removed.  Wrap patch in a tissue and discard in the trash. Wash hands thoroughly with soap and water. 2. You may remove the patch earlier than 72 hours if you experience unpleasant side effects which may include dry mouth, dizziness or visual disturbances. 3. Avoid touching the patch. Wash your hands with soap and water after contact with the patch.    Regional Anesthesia Blocks  1. Numbness or the inability to move the "blocked" extremity may last from 3-48 hours after placement. The length of time depends on the medication injected and your individual response to the medication. If the numbness is not going away after 48 hours, call your surgeon.  2. The extremity that is blocked will need to be protected until the numbness is gone and the  Strength has returned. Because you cannot feel it, you will need to take extra care to avoid injury. Because it may be weak, you may have difficulty moving it or using it. You may not know what position it is in without looking at it while the block is in effect.  3. For blocks in the legs and feet, returning to weight bearing and walking needs to be done carefully. You will need to wait until the numbness is entirely  gone and the strength has returned. You should be able to move your leg and foot normally before you try and bear weight or walk. You will need someone to be with you when you first try to ensure you do not fall and possibly risk injury.  4. Bruising and tenderness at the needle site are common side effects and will resolve in a few days.  5. Persistent numbness or new problems with movement should be communicated to the surgeon or the [place]  [phone] [place] [phone]). Information for Discharge Teaching: EXPAREL (bupivacaine liposome injectable suspension)   Your surgeon or anesthesiologist gave you EXPAREL(bupivacaine) to help control your pain after surgery.  EXPAREL is a local anesthetic that provides pain relief by numbing the tissue around the surgical site. EXPAREL is designed to release pain medication over time and can control pain for up to 72 hours. Depending on how you respond to EXPAREL, you may require less pain medication during your recovery.  Possible side effects: Temporary loss of sensation or ability to move in the area where bupivacaine was injected. Nausea, vomiting, constipation Rarely, numbness and tingling in your mouth or lips, lightheadedness, or anxiety may occur. Call your doctor right away if you think you may be experiencing any of these sensations, or if you have other questions regarding possible side effects.  Follow all other discharge instructions given to you by your surgeon or nurse. Eat a healthy diet and drink plenty of water or other fluids.  If you return to the hospital for any reason within 96 hours following the administration of EXPAREL, it is important for health care providers to know that you have received this anesthetic. A teal colored band has been placed on your arm with the date, time and amount of EXPAREL you have received in order to alert and inform your health care providers. Please leave this armband in place for the full 96 hours following administration, and then you may remove the band.

## 2021-07-28 NOTE — Anesthesia Procedure Notes (Signed)
Anesthesia Regional Block: Interscalene brachial plexus block   Pre-Anesthetic Checklist: , timeout performed,  Correct Patient, Correct Site, Correct Laterality,  Correct Procedure, Correct Position, site marked,  Risks and benefits discussed,  Surgical consent,  Pre-op evaluation,  At surgeon's request and post-op pain management  Laterality: Right  Prep: chloraprep       Needles:  Injection technique: Single-shot  Needle Type: Echogenic Stimulator Needle     Needle Length: 5cm  Needle Gauge: 22     Additional Needles:   Procedures:, nerve stimulator,,, ultrasound used (permanent image in chart),,     Nerve Stimulator or Paresthesia:  Response: hand, 0.45 mA  Additional Responses:   Narrative:  Start time: 07/28/2021 9:05 AM End time: 07/28/2021 9:10 AM Injection made incrementally with aspirations every 5 mL.  Performed by: Personally  Anesthesiologist: Janeece Riggers, MD  Additional Notes: Functioning IV was confirmed and monitors were applied.  A 34m 22ga Arrow echogenic stimulator needle was used. Sterile prep and drape,hand hygiene and sterile gloves were used. Ultrasound guidance: relevant anatomy identified, needle position confirmed, local anesthetic spread visualized around nerve(s)., vascular puncture avoided.  Image printed for medical record. Negative aspiration and negative test dose prior to incremental administration of local anesthetic. The patient tolerated the procedure well.

## 2021-07-28 NOTE — Op Note (Signed)
NAMEAMARYLLIS, Sara [name] MEDICAL RECORD NO: [id number] ACCOUNT NO: [id number] DATE OF BIRTH: June 05, 1951 FACILITY: [facility] LOCATION: [facility] PHYSICIAN: W D. Valeta Harms., MD  Operative Report   DATE OF PROCEDURE: 07/28/2021  PREOPERATIVE DIAGNOSES: 1.  Bilateral shoulder osteoarthritis. 2.  Right rotator cuff tear (chronic). 3.  Impingement. 4.  AC joint arthritis. 5.  Grade 3 glenohumeral arthritis. 6.  Degenerative tearing of the anterior, superior and inferior labrum.  POSTOPERATIVE DIAGNOSES:   1.  Bilateral shoulder osteoarthritis. 2.  Right rotator cuff tear (chronic). 3.  Impingement. 4.  AC joint arthritis. 5.  Grade 3 glenohumeral arthritis. 6.  Degenerative tearing of the anterior, superior and inferior labrum.  OPERATION: 1.  Open rotator cuff repair and acromioplasty (chronic). 2.  Arthroscopic debridement (limited). 3.  Open distal clavicle off of the right shoulder. 4.  Intraarticular injection, left shoulder.  SURGEON:  W D. Valeta Harms., MD  ASSISTANTMarjo Bicker.  ANESTHESIA:  General with a block.  DESCRIPTION OF PROCEDURE:  Prior to the procedure on the right, we injected the left shoulder intraarticularly with 4-5 mL of Marcaine and 40 Depo-Medrol.  Beach chair positioning, right shoulder arthroscopy noted.  Intraarticular findings included generalized grade 3 arthritis of the glenohumeral joint, debrided.  No grade 4 changes appreciated.  Significant tearing of the anterior, superior, inferior  labrum noted as well debrided.  Full thickness partial width tear of the supraspinatus with severe tendinopathy noted.  We did debride the undersurface with the scope.  Again, intra-articular debridement with the scope included the above-mentioned  structures.  Subscap was intact as well as the biceps tendon.  After discovering a full thickness tear, we elected to convert this to an open procedure with an incision, bisecting the acromial AC interval.  Severe arthritis  of the distal clavicle was  noted.  We excised the distal clavicle over a centimeter.  Acromioplasty was carried out.  We debrided and identified the cuff tear on the supraspinatus with significant tendinopathy noted.  We freshened the tuberosity with a bur followed by using a  medial lateral row intact, specifically using a double arm 4.75 Arthrex SwiveLock with the medial row towards the lateral row, we additionally incorporated an additional suture from the medial row anchor as well as the insurance stitch on the small dog  ear posteriorly.  This created essentially a watertight repair.  Once the lateral row anchor was placed.  Wound was irrigated.  We had split the deltoid, it was repaired with #1 Ti-Cron, the subcutaneous tissues with 2-0 Vicryl and the skin with  Monocryl, placed in a shoulder immobilizer with a mini pillow splint.  Taken to recovery room in stable condition.   PUS D: 07/28/2021 11:57:01 am T: 07/28/2021 12:23:00 pm  JOB: [id number]

## 2021-07-28 NOTE — Anesthesia Procedure Notes (Signed)
Procedure Name: Intubation Date/Time: 07/28/2021 10:44 AM Performed by: Signe Colt, CRNA Pre-anesthesia Checklist: Patient identified, Emergency Drugs available, Suction available and Patient being monitored Patient Re-evaluated:Patient Re-evaluated prior to induction Oxygen Delivery Method: Circle system utilized Preoxygenation: Pre-oxygenation with 100% oxygen Induction Type: IV induction Ventilation: Mask ventilation without difficulty Laryngoscope Size: Mac and 3 Grade View: Grade I Tube type: Oral Tube size: 7.0 mm Number of attempts: 1 Airway Equipment and Method: Stylet and Oral airway Placement Confirmation: ETT inserted through vocal cords under direct vision, positive ETCO2 and breath sounds checked- equal and bilateral Secured at: 21 cm Tube secured with: Tape Dental Injury: Teeth and Oropharynx as per pre-operative assessment

## 2021-07-28 NOTE — Anesthesia Preprocedure Evaluation (Signed)
Anesthesia Evaluation  Patient identified by MRN, date of birth, ID band Patient awake    Reviewed: Allergy & Precautions, NPO status , Patient's Chart, lab work & pertinent test results, reviewed documented beta blocker date and time   History of Anesthesia Complications (+) PONV and history of anesthetic complications  Airway Mallampati: II  TM Distance: >3 FB Neck ROM: Full    Dental no notable dental hx. (+) Dental Advisory Given, Teeth Intact   Pulmonary asthma , sleep apnea , COPD,    Pulmonary exam normal breath sounds clear to auscultation       Cardiovascular hypertension, Pt. on medications and Pt. on home beta blockers Normal cardiovascular exam Rhythm:Regular Rate:Normal     Neuro/Psych  Headaches, PSYCHIATRIC DISORDERS Anxiety Depression  Neuromuscular disease    GI/Hepatic Neg liver ROS, GERD  ,  Endo/Other  Morbid obesity  Renal/GU negative Renal ROS     Musculoskeletal  (+) Arthritis , Fibromyalgia -  Abdominal (+) + obese,   Peds  Hematology negative hematology ROS (+)   Anesthesia Other Findings   Reproductive/Obstetrics negative OB ROS                             Anesthesia Physical  Anesthesia Plan  ASA: 2  Anesthesia Plan: General   Post-op Pain Management: GA combined w/ Regional for post-op pain   Induction: Intravenous  PONV Risk Score and Plan: 4 or greater and Ondansetron, Dexamethasone, Treatment may vary due to age or medical condition, Midazolam and Propofol infusion  Airway Management Planned: LMA and Oral ETT  Additional Equipment: None  Intra-op Plan:   Post-operative Plan: Extubation in OR  Informed Consent: I have reviewed the patients History and Physical, chart, labs and discussed the procedure including the risks, benefits and alternatives for the proposed anesthesia with the patient or authorized representative who has indicated his/her  understanding and acceptance.     Dental advisory given  Plan Discussed with: CRNA and Anesthesiologist  Anesthesia Plan Comments: (Discussed both nerve block for pain relief post-op and GA; including NV, sore throat, dental injury, and pulmonary complications)        Anesthesia Quick Evaluation

## 2021-07-28 NOTE — Brief Op Note (Signed)
07/28/2021  12:18 PM  PATIENT:  Sara [name]  70 y.o. female  PRE-OPERATIVE DIAGNOSIS:  RIGHT SHOULDER ROTATOR CUFF TEAR  POST-OPERATIVE DIAGNOSIS:  RIGHT SHOULDER ROTATOR CUFF TEAR  PROCEDURE:  Procedure(s): SHOULDER ARTHROSCOPY WITH OPEN ROTATOR CUFF REPAIR, ACROMIOPLASTY AND DISTAL CLAVICLE ARTHROSCOPIC DEBRIDEMENT, LEFT SHOULDER INJECTION (Right)  SURGEON:  Surgeon(s) and Role:    Earlie Server, MD - Primary  PHYSICIAN ASSISTANT: Chriss Czar, PA-C  ASSISTANTS:    ANESTHESIA:   regional and general  EBL:  20 mL   BLOOD ADMINISTERED:none  DRAINS: none   LOCAL MEDICATIONS USED:  MARCAINE     SPECIMEN:  No Specimen  DISPOSITION OF SPECIMEN:  N/A  COUNTS:  YES  TOURNIQUET:  * No tourniquets in log *  DICTATION: .Other Dictation: Dictation Number unknown  PLAN OF CARE: Discharge to home after PACU  PATIENT DISPOSITION:  PACU - hemodynamically stable.   Delay start of Pharmacological VTE agent (>24hrs) due to surgical blood loss or risk of bleeding: not applicable

## 2021-07-28 NOTE — Progress Notes (Signed)
Assisted Dr. Ambrose Pancoast with right, ultrasound guided, interscalene  block. Side rails up, monitors on throughout procedure. See vital signs in flow sheet. Tolerated Procedure well.

## 2021-07-28 NOTE — Interval H&P Note (Signed)
History and Physical Interval Note:  07/28/2021 9:40 AM  [place]  has presented today for surgery, with the diagnosis of [place].  The various methods of treatment have been discussed with the patient and family. After consideration of risks, benefits and other options for treatment, the patient has consented to  Procedure(s): SHOULDER ARTHROSCOPY WITH OPEN ROTATOR CUFF REPAIR AND DISTAL CLAVICLE ACROMINECTOMY, LEFT SHOULDER INJECTION (Right) as a surgical intervention.  The patient's history has been reviewed, patient examined, no change in status, stable for surgery.  I have reviewed the patient's chart and labs.  Questions were answered to the patient's satisfaction.     Yvette Rack

## 2021-07-31 ENCOUNTER — Encounter (HOSPITAL_BASED_OUTPATIENT_CLINIC_OR_DEPARTMENT_OTHER): Payer: Self-pay | Admitting: Orthopedic Surgery

## 2021-07-31 ENCOUNTER — Ambulatory Visit: Payer: Medicare PPO

## 2021-07-31 NOTE — Anesthesia Postprocedure Evaluation (Signed)
Anesthesia Post Note  Patient: Sara [name]  Procedure(s) Performed: RIGHT SHOULDER ARTHROSCOPY WITH OPEN ROTATOR CUFF REPAIR, ACROMIOPLASTY AND DISTAL CLAVICLE ARTHROSCOPIC DEBRIDEMENT, LEFT SHOULDER INTRA-ARTICULAR INJECTION (Right: Shoulder)     Patient location during evaluation: PACU Anesthesia Type: General Level of consciousness: awake and alert Pain management: pain level controlled Vital Signs Assessment: post-procedure vital signs reviewed and stable Respiratory status: spontaneous breathing, nonlabored ventilation, respiratory function stable and patient connected to nasal cannula oxygen Cardiovascular status: blood pressure returned to baseline and stable Postop Assessment: no apparent nausea or vomiting Anesthetic complications: no   No notable events documented.  Last Vitals:  Vitals:   07/28/21 1245 07/28/21 1322  BP: 134/88 (!) 141/93  Pulse: 72 75  Resp: 17 16  Temp:  36.7 C  SpO2: 97% 94%    Last Pain:  Vitals:   07/28/21 0817  TempSrc: Oral  PainSc: 0-No pain                 Lovett Coffin

## 2021-08-17 ENCOUNTER — Other Ambulatory Visit: Payer: Self-pay | Admitting: Sports Medicine

## 2021-08-27 ENCOUNTER — Other Ambulatory Visit: Payer: Self-pay | Admitting: Sports Medicine

## 2021-08-27 DIAGNOSIS — F418 Other specified anxiety disorders: Secondary | ICD-10-CM

## 2021-08-27 DIAGNOSIS — I10 Essential (primary) hypertension: Secondary | ICD-10-CM

## 2021-08-27 DIAGNOSIS — G4700 Insomnia, unspecified: Secondary | ICD-10-CM

## 2021-08-29 ENCOUNTER — Ambulatory Visit (INDEPENDENT_AMBULATORY_CARE_PROVIDER_SITE_OTHER): Payer: Medicare PPO | Admitting: *Deleted

## 2021-08-29 ENCOUNTER — Other Ambulatory Visit: Payer: Self-pay

## 2021-08-29 DIAGNOSIS — J309 Allergic rhinitis, unspecified: Secondary | ICD-10-CM

## 2021-08-29 NOTE — Progress Notes (Signed)
Immunotherapy   Patient Details  Name: Sara [name] MRN: [id number] Date of Birth: December 03, 1951  08/29/2021  Merril Abbe started injections for  G-RW-T, M-C-D Following schedule: B  Frequency:1 time per week Epi-Pen:Epi-Pen Available  Consent signed and patient instructions given.  Patient started allergy injections today and received 0.36m of G-RW-T in the [facility] and 0.021mof M-C-D in the [place] waited 30 minutes and did not experience any issues.     [name] 08/29/2021, 5:40 PM

## 2021-08-30 ENCOUNTER — Other Ambulatory Visit: Payer: Self-pay | Admitting: Family

## 2021-09-01 DIAGNOSIS — R531 Weakness: Secondary | ICD-10-CM | POA: Diagnosis not present

## 2021-09-01 DIAGNOSIS — M75121 Complete rotator cuff tear or rupture of right shoulder, not specified as traumatic: Secondary | ICD-10-CM | POA: Diagnosis not present

## 2021-09-07 ENCOUNTER — Ambulatory Visit (INDEPENDENT_AMBULATORY_CARE_PROVIDER_SITE_OTHER): Payer: Medicare PPO

## 2021-09-07 DIAGNOSIS — M75121 Complete rotator cuff tear or rupture of right shoulder, not specified as traumatic: Secondary | ICD-10-CM | POA: Diagnosis not present

## 2021-09-07 DIAGNOSIS — R531 Weakness: Secondary | ICD-10-CM | POA: Diagnosis not present

## 2021-09-07 DIAGNOSIS — J309 Allergic rhinitis, unspecified: Secondary | ICD-10-CM

## 2021-09-11 ENCOUNTER — Ambulatory Visit: Payer: Medicare PPO

## 2021-09-11 ENCOUNTER — Ambulatory Visit: Payer: Medicare PPO | Admitting: Allergy

## 2021-09-15 ENCOUNTER — Ambulatory Visit (INDEPENDENT_AMBULATORY_CARE_PROVIDER_SITE_OTHER): Payer: Medicare PPO

## 2021-09-15 DIAGNOSIS — J309 Allergic rhinitis, unspecified: Secondary | ICD-10-CM | POA: Diagnosis not present

## 2021-09-17 ENCOUNTER — Other Ambulatory Visit: Payer: Self-pay | Admitting: Sports Medicine

## 2021-09-17 DIAGNOSIS — F418 Other specified anxiety disorders: Secondary | ICD-10-CM

## 2021-09-20 DIAGNOSIS — R531 Weakness: Secondary | ICD-10-CM | POA: Diagnosis not present

## 2021-09-20 DIAGNOSIS — M75121 Complete rotator cuff tear or rupture of right shoulder, not specified as traumatic: Secondary | ICD-10-CM | POA: Diagnosis not present

## 2021-09-22 ENCOUNTER — Ambulatory Visit (INDEPENDENT_AMBULATORY_CARE_PROVIDER_SITE_OTHER): Payer: Medicare PPO

## 2021-09-22 DIAGNOSIS — J309 Allergic rhinitis, unspecified: Secondary | ICD-10-CM | POA: Diagnosis not present

## 2021-09-22 DIAGNOSIS — R531 Weakness: Secondary | ICD-10-CM | POA: Diagnosis not present

## 2021-09-22 DIAGNOSIS — M75121 Complete rotator cuff tear or rupture of right shoulder, not specified as traumatic: Secondary | ICD-10-CM | POA: Diagnosis not present

## 2021-09-25 DIAGNOSIS — M25511 Pain in right shoulder: Secondary | ICD-10-CM | POA: Diagnosis not present

## 2021-09-28 ENCOUNTER — Ambulatory Visit (INDEPENDENT_AMBULATORY_CARE_PROVIDER_SITE_OTHER): Payer: Medicare PPO

## 2021-09-28 DIAGNOSIS — J309 Allergic rhinitis, unspecified: Secondary | ICD-10-CM

## 2021-09-28 DIAGNOSIS — M75121 Complete rotator cuff tear or rupture of right shoulder, not specified as traumatic: Secondary | ICD-10-CM | POA: Diagnosis not present

## 2021-09-28 DIAGNOSIS — R531 Weakness: Secondary | ICD-10-CM | POA: Diagnosis not present

## 2021-09-30 NOTE — Patient Instructions (Addendum)
  Seasonal and perennial allergic rhinitis Continue environmental control measures directed towards: grass, ragweed, weed pollen, mold, cat, and borderline dog Continue carbinoxamine 4mg  (1 to 1.5 tablet) twice a day as needed. Continue azelastine nasal spray 1-2 sprays per nostril twice a day as needed for runny nose/drainage. May use Flonase (fluticasone) nasal spray 1 spray per nostril twice a day as needed for nasal congestion.   May useNasal saline spray (i.e., Simply Saline) or nasal saline lavage (i.e., NeilMed) is recommended as needed and prior to medicated nasal sprays. Continue Singulair (montelukast) 10mg  daily at night. Continue allergy injections per protocol and have access to EpiPen   Asthma May use levoalbuterol rescue inhaler 2 puffs or nebulizer every 4 to 6 hours as needed for shortness of breath, chest tightness, coughing, and wheezing. Monitor frequency of use.  Stop Flovent 110 mcg    Coughing-stable The most common causes of chronic cough include the following: upper airway cough syndrome (UACS) which is caused by variety of rhinitis conditions; asthma; gastroesophageal reflux disease (GERD); chronic bronchitis from cigarette smoking or other inhaled environmental irritants; non-asthmatic eosinophilic bronchitis; and bronchiectasis.   Heartburn Continue lifestyle and dietary modifications. Continue Dexilant as prescribed.   Anaphylaxis due to hymenoptera venom Avoid bee stings. In case of an allergic reaction, give Benadryl 4 teaspoonfuls every 4 hours, and if life-threatening symptoms occur, inject with EpiPen 0.3 mg.  Speak with your primary care physician about your palpitations Follow up in 3 months or sooner if needed.

## 2021-10-02 ENCOUNTER — Other Ambulatory Visit: Payer: Self-pay

## 2021-10-02 ENCOUNTER — Encounter: Payer: Self-pay | Admitting: Family

## 2021-10-02 ENCOUNTER — Ambulatory Visit: Payer: Medicare PPO | Admitting: Family

## 2021-10-02 VITALS — BP 110/70 | HR 72 | Temp 97.2°F | Resp 16 | Ht 63.0 in | Wt 202.4 lb

## 2021-10-02 DIAGNOSIS — H02403 Unspecified ptosis of bilateral eyelids: Secondary | ICD-10-CM | POA: Diagnosis not present

## 2021-10-02 DIAGNOSIS — R059 Cough, unspecified: Secondary | ICD-10-CM | POA: Diagnosis not present

## 2021-10-02 DIAGNOSIS — J4551 Severe persistent asthma with (acute) exacerbation: Secondary | ICD-10-CM

## 2021-10-02 DIAGNOSIS — R12 Heartburn: Secondary | ICD-10-CM

## 2021-10-02 DIAGNOSIS — H0100B Unspecified blepharitis left eye, upper and lower eyelids: Secondary | ICD-10-CM | POA: Diagnosis not present

## 2021-10-02 DIAGNOSIS — J309 Allergic rhinitis, unspecified: Secondary | ICD-10-CM | POA: Diagnosis not present

## 2021-10-02 DIAGNOSIS — H527 Unspecified disorder of refraction: Secondary | ICD-10-CM | POA: Diagnosis not present

## 2021-10-02 DIAGNOSIS — J45909 Unspecified asthma, uncomplicated: Secondary | ICD-10-CM

## 2021-10-02 DIAGNOSIS — H0100A Unspecified blepharitis right eye, upper and lower eyelids: Secondary | ICD-10-CM | POA: Diagnosis not present

## 2021-10-02 DIAGNOSIS — Z961 Presence of intraocular lens: Secondary | ICD-10-CM | POA: Diagnosis not present

## 2021-10-02 DIAGNOSIS — H43813 Vitreous degeneration, bilateral: Secondary | ICD-10-CM | POA: Diagnosis not present

## 2021-10-02 DIAGNOSIS — H57813 Brow ptosis, bilateral: Secondary | ICD-10-CM | POA: Diagnosis not present

## 2021-10-02 DIAGNOSIS — T63484D Toxic effect of venom of other arthropod, undetermined, subsequent encounter: Secondary | ICD-10-CM

## 2021-10-02 NOTE — Progress Notes (Signed)
[place] [address] Dept: [phone]  FOLLOW UP NOTE  Patient ID: Sara [name], female    DOB: 21-May-1951  Age: 70 y.o. MRN: [id number] Date of Office Visit: 10/02/2021  Assessment  Chief Complaint: Follow-up (Patient has No new problems.)  HPI Sara [name] is a 70 year old female who presents today for follow-up of seasonal and perennial allergic rhinitis, asthma, coughing, heartburn, and anaphylaxis due to hymenoptera venom.  She was last seen on July 10, 2021 by Althea Charon, FNP.  Seasonal and perennial allergic rhinitis is reported as controlled with carbinoxamine 4 mg once a day, azelastine nasal spray as needed, fluticasone nasal spray as needed, and allergy injections per protocol.  She denies any large local reactions at the injection site, but reports that she did have a bruise last week that she is not sure if it was due to physical therapy.  She reports that since we last saw her she had right shoulder rotator cuff surgery on August 28, 2021.  She reports a tiny bit of postnasal drip every once in a while and denies rhinorrhea, nasal congestion, and sneezing.  She denies any sinus infections since we last saw her.  Asthma is reported as controlled with levo albuterol as needed and Flovent 110 mcg as needed.  She reports that she used Flovent 110 mcg 2 puffs twice a day for a while as prescribed and then her symptoms got better so she was using the Flovent just as needed.  She reports tightness every once in a while and shortness of breath every once in a while.  She denies coughing, wheezing, and nocturnal awakenings due to breathing problems.  She has not had any other steroids other than the ones we have prescribed at the last office visit.  She has not required any trips to the emergency room or urgent care due to breathing problems and has not had to use her levoalbuterol inhaler in over a month.  Heartburn is reported as controlled with Dexilant  once a day.  She denies any heartburn or reflux symptoms.  Coughing is reported as controlled with the regimen as above.  Since her last office visit she has not had any bee stings and has not had to use her EpiPen.  She reports that her EpiPen is up-to-date.  She mentions that she has been having palpitations at times where her heart feels like it is off beat for second.  She has a physical next week with her primary care physician.  She is going to discuss this with her primary care physician.   Drug Allergies:  Allergies  Allergen Reactions   Bee Venom Anaphylaxis    Review of Systems: Review of Systems  Constitutional:  Negative for chills and fever.  HENT:         Reports a tiny bit of post nasal drip every once and awhile. Denies rhinorrhea, nasal congestion and sneezing.  Eyes:        Reports watery eyes-diagnosed with dry eyes today and given eye drops  Respiratory:  Positive for shortness of breath. Negative for cough and wheezing.        Reports shortness of breath at times and tightness in her chest every once in awhile. Denies cough, wheeze, and nocturnal awakenings  Cardiovascular:  Positive for palpitations. Negative for chest pain.       Reports palpitations or feeling as if her heart is off beat for a few seconds. Going to speak with  primary care physician about this next week  Gastrointestinal:        Denies heartburn and reflux symptoms on Dexilant daily  Genitourinary:  Negative for frequency.  Skin:  Negative for itching and rash.  Neurological:  Negative for headaches.  Endo/Heme/Allergies:  Positive for environmental allergies.    Physical Exam: BP 110/70   Pulse 72   Temp (!) 97.2 F (36.2 C) (Temporal)   Resp 16   Ht 5\' 3"  (1.6 m)   Wt 202 lb 6.4 oz (91.8 kg)   SpO2 97%   BMI 35.85 kg/m    Physical Exam Constitutional:      Appearance: Normal appearance.  HENT:     Head: Normocephalic and atraumatic.     Comments: Pharynx normal. Eyes normal.  Ears unable to see right tympanic membrane due to cerumen. Left ear normal. Nose: bilateral lower turbinates moderately edematous and slightly erythematous with clear drainage noted.    Right Ear: Ear canal and external ear normal.     Left Ear: Tympanic membrane, ear canal and external ear normal.     Mouth/Throat:     Mouth: Mucous membranes are moist.     Pharynx: Oropharynx is clear.  Cardiovascular:     Rate and Rhythm: Normal rate and regular rhythm.     Pulses: Normal pulses.     Heart sounds: Normal heart sounds.  Pulmonary:     Effort: Pulmonary effort is normal.     Breath sounds: Normal breath sounds.     Comments: Lungs clear to auscultation Musculoskeletal:     Cervical back: Neck supple.  Skin:    General: Skin is warm.  Neurological:     Mental Status: She is alert and oriented to person, place, and time.  Psychiatric:        Mood and Affect: Mood normal.        Behavior: Behavior normal.        Thought Content: Thought content normal.        Judgment: Judgment normal.    Diagnostics: FVC 2.48 L, FEV1 2.02 L.  Predicted FVC 2.94 L, predicted FEV1 2.23 L.  Spirometry indicates normal ventilatory function.  Assessment and Plan: 1. Asthma, unspecified asthma severity, unspecified whether complicated, unspecified whether persistent   2. Allergic rhinitis, unspecified seasonality, unspecified trigger   3. Cough, unspecified type   4. Anaphylaxis due to hymenoptera venom, undetermined intent, subsequent encounter   5. Heartburn     No orders of the defined types were placed in this encounter.   Patient Instructions   Seasonal and perennial allergic rhinitis Continue environmental control measures directed towards: grass, ragweed, weed pollen, mold, cat, and borderline dog Continue carbinoxamine 4mg  (1 to 1.5 tablet) twice a day as needed. Continue azelastine nasal spray 1-2 sprays per nostril twice a day as needed for runny nose/drainage. May use Flonase  (fluticasone) nasal spray 1 spray per nostril twice a day as needed for nasal congestion.   May useNasal saline spray (i.e., Simply Saline) or nasal saline lavage (i.e., NeilMed) is recommended as needed and prior to medicated nasal sprays. Continue Singulair (montelukast) 10mg  daily at night. Continue allergy injections per protocol and have access to EpiPen   Asthma May use levoalbuterol rescue inhaler 2 puffs or nebulizer every 4 to 6 hours as needed for shortness of breath, chest tightness, coughing, and wheezing. Monitor frequency of use.  Stop Flovent 110 mcg    Coughing-stable The most common causes of chronic cough include the following:  upper airway cough syndrome (UACS) which is caused by variety of rhinitis conditions; asthma; gastroesophageal reflux disease (GERD); chronic bronchitis from cigarette smoking or other inhaled environmental irritants; non-asthmatic eosinophilic bronchitis; and bronchiectasis.   Heartburn Continue lifestyle and dietary modifications. Continue Dexilant as prescribed.   Anaphylaxis due to hymenoptera venom Avoid bee stings. In case of an allergic reaction, give Benadryl 4 teaspoonfuls every 4 hours, and if life-threatening symptoms occur, inject with EpiPen 0.3 mg.  Speak with your primary care physician about your palpitations Follow up in 3 months or sooner if needed.   Return in about 3 months (around 01/02/2022), or if symptoms worsen or fail to improve.    Thank you for the opportunity to care for this patient.  Please do not hesitate to contact me with questions.  Althea Charon, FNP [facility]

## 2021-10-03 DIAGNOSIS — M75121 Complete rotator cuff tear or rupture of right shoulder, not specified as traumatic: Secondary | ICD-10-CM | POA: Diagnosis not present

## 2021-10-03 DIAGNOSIS — R531 Weakness: Secondary | ICD-10-CM | POA: Diagnosis not present

## 2021-10-05 ENCOUNTER — Other Ambulatory Visit: Payer: Self-pay | Admitting: Family Medicine

## 2021-10-06 ENCOUNTER — Other Ambulatory Visit: Payer: Self-pay | Admitting: Sports Medicine

## 2021-10-06 DIAGNOSIS — Z1231 Encounter for screening mammogram for malignant neoplasm of breast: Secondary | ICD-10-CM

## 2021-10-06 DIAGNOSIS — S46211A Strain of muscle, fascia and tendon of other parts of biceps, right arm, initial encounter: Secondary | ICD-10-CM | POA: Diagnosis not present

## 2021-10-10 ENCOUNTER — Ambulatory Visit: Payer: Medicare PPO | Admitting: Sports Medicine

## 2021-10-10 ENCOUNTER — Other Ambulatory Visit: Payer: Self-pay

## 2021-10-10 DIAGNOSIS — M75121 Complete rotator cuff tear or rupture of right shoulder, not specified as traumatic: Secondary | ICD-10-CM | POA: Diagnosis not present

## 2021-10-10 DIAGNOSIS — R002 Palpitations: Secondary | ICD-10-CM | POA: Diagnosis not present

## 2021-10-10 DIAGNOSIS — K219 Gastro-esophageal reflux disease without esophagitis: Secondary | ICD-10-CM

## 2021-10-10 DIAGNOSIS — G4733 Obstructive sleep apnea (adult) (pediatric): Secondary | ICD-10-CM

## 2021-10-10 DIAGNOSIS — R531 Weakness: Secondary | ICD-10-CM | POA: Diagnosis not present

## 2021-10-10 DIAGNOSIS — S46011S Strain of muscle(s) and tendon(s) of the rotator cuff of right shoulder, sequela: Secondary | ICD-10-CM

## 2021-10-10 DIAGNOSIS — Z0000 Encounter for general adult medical examination without abnormal findings: Secondary | ICD-10-CM

## 2021-10-10 DIAGNOSIS — G4700 Insomnia, unspecified: Secondary | ICD-10-CM

## 2021-10-10 NOTE — Assessment & Plan Note (Signed)
We will going get her set up for Medicare wellness visit with [facility], no further questions.

## 2021-10-10 NOTE — Assessment & Plan Note (Signed)
Blood pressure now controlled, on CPAP. Mild persistent daytime sleepiness.

## 2021-10-10 NOTE — Assessment & Plan Note (Signed)
Historically unremarkable stress echo 2012 and echocardiogram 2020 with mitral valve prolapse, we also did long-term event monitoring in 2020 that showed a short run of V. tach, she has since been on metoprolol. Only occasional palpitations, she understands now that metoprolol is meant to control this and patients with mitral valve prolapse can get occasional symptoms of palpitations. Labs historically normal. We can simply watch this for now.

## 2021-10-10 NOTE — Progress Notes (Signed)
    Procedures performed today:    None.  Independent interpretation of notes and tests performed by another provider:   None.  Brief History, Exam, Impression, and Recommendations:    Annual physical exam We will going get her set up for Medicare wellness visit with [facility], no further questions.  Complete rotator cuff tear or rupture of right shoulder, not specified as traumatic Now doing well postop. Currently progressing through physical therapy.  Insomnia Having some daytime drowsiness, she does have a history of sleep apnea, she is also on trazodone and Ambien, occasional Valium. I did advise her that the best course of action here for any daytime sleepiness would be to start peeling off some of her sedatives. She will play around with it.  Obstructive sleep apnea Blood pressure now controlled, on CPAP. Mild persistent daytime sleepiness.  Palpitations Historically unremarkable stress echo 2012 and echocardiogram 2020 with mitral valve prolapse, we also did long-term event monitoring in 2020 that showed a short run of V. tach, she has since been on metoprolol. Only occasional palpitations, she understands now that metoprolol is meant to control this and patients with mitral valve prolapse can get occasional symptoms of palpitations. Labs historically normal. We can simply watch this for now.   GERD (gastroesophageal reflux disease) Continue Dexilant, no changes.    ___________________________________________ [name]. [name], M.D., ABFM., CAQSM. Primary Care and [place] [employer] [facility]

## 2021-10-10 NOTE — Assessment & Plan Note (Signed)
Having some daytime drowsiness, she does have a history of sleep apnea, she is also on trazodone and Ambien, occasional Valium. I did advise her that the best course of action here for any daytime sleepiness would be to start peeling off some of her sedatives. She will play around with it.

## 2021-10-10 NOTE — Assessment & Plan Note (Signed)
Continue Dexilant, no changes.

## 2021-10-10 NOTE — Assessment & Plan Note (Signed)
Now doing well postop. Currently progressing through physical therapy.

## 2021-10-12 ENCOUNTER — Ambulatory Visit (INDEPENDENT_AMBULATORY_CARE_PROVIDER_SITE_OTHER): Payer: Medicare PPO | Admitting: *Deleted

## 2021-10-12 DIAGNOSIS — J309 Allergic rhinitis, unspecified: Secondary | ICD-10-CM

## 2021-10-12 DIAGNOSIS — R531 Weakness: Secondary | ICD-10-CM | POA: Diagnosis not present

## 2021-10-12 DIAGNOSIS — M75121 Complete rotator cuff tear or rupture of right shoulder, not specified as traumatic: Secondary | ICD-10-CM | POA: Diagnosis not present

## 2021-10-16 DIAGNOSIS — H0279 Other degenerative disorders of eyelid and periocular area: Secondary | ICD-10-CM | POA: Diagnosis not present

## 2021-10-16 DIAGNOSIS — H02413 Mechanical ptosis of bilateral eyelids: Secondary | ICD-10-CM | POA: Diagnosis not present

## 2021-10-16 DIAGNOSIS — H02834 Dermatochalasis of left upper eyelid: Secondary | ICD-10-CM | POA: Diagnosis not present

## 2021-10-16 DIAGNOSIS — H57813 Brow ptosis, bilateral: Secondary | ICD-10-CM | POA: Diagnosis not present

## 2021-10-16 DIAGNOSIS — H02423 Myogenic ptosis of bilateral eyelids: Secondary | ICD-10-CM | POA: Diagnosis not present

## 2021-10-16 DIAGNOSIS — H53483 Generalized contraction of visual field, bilateral: Secondary | ICD-10-CM | POA: Diagnosis not present

## 2021-10-16 DIAGNOSIS — H02831 Dermatochalasis of right upper eyelid: Secondary | ICD-10-CM | POA: Diagnosis not present

## 2021-10-16 DIAGNOSIS — H04123 Dry eye syndrome of bilateral lacrimal glands: Secondary | ICD-10-CM | POA: Diagnosis not present

## 2021-10-17 ENCOUNTER — Other Ambulatory Visit: Payer: Self-pay | Admitting: Sports Medicine

## 2021-10-17 DIAGNOSIS — M47812 Spondylosis without myelopathy or radiculopathy, cervical region: Secondary | ICD-10-CM

## 2021-10-18 ENCOUNTER — Other Ambulatory Visit: Payer: Self-pay

## 2021-10-18 ENCOUNTER — Ambulatory Visit (INDEPENDENT_AMBULATORY_CARE_PROVIDER_SITE_OTHER): Payer: Medicare PPO

## 2021-10-18 ENCOUNTER — Ambulatory Visit (INDEPENDENT_AMBULATORY_CARE_PROVIDER_SITE_OTHER): Payer: Medicare PPO | Admitting: *Deleted

## 2021-10-18 DIAGNOSIS — Z1231 Encounter for screening mammogram for malignant neoplasm of breast: Secondary | ICD-10-CM | POA: Diagnosis not present

## 2021-10-18 DIAGNOSIS — J309 Allergic rhinitis, unspecified: Secondary | ICD-10-CM | POA: Diagnosis not present

## 2021-10-18 DIAGNOSIS — R531 Weakness: Secondary | ICD-10-CM | POA: Diagnosis not present

## 2021-10-18 DIAGNOSIS — M75121 Complete rotator cuff tear or rupture of right shoulder, not specified as traumatic: Secondary | ICD-10-CM | POA: Diagnosis not present

## 2021-10-23 NOTE — Telephone Encounter (Signed)
Sounds like a viral process, possibly diverticulitis as well, lets get her in within the next day or 2 to someone with an opening.

## 2021-10-23 NOTE — Telephone Encounter (Signed)
Pt scheduled with Caleen Jobs, NP on 10/24/2021.  Charyl Bigger, CMA

## 2021-10-24 ENCOUNTER — Other Ambulatory Visit: Payer: Self-pay | Admitting: Sports Medicine

## 2021-10-24 ENCOUNTER — Encounter: Payer: Self-pay | Admitting: Family Medicine

## 2021-10-24 ENCOUNTER — Other Ambulatory Visit: Payer: Self-pay

## 2021-10-24 ENCOUNTER — Ambulatory Visit: Payer: Medicare PPO | Admitting: Family Medicine

## 2021-10-24 VITALS — BP 114/79 | HR 66 | Temp 98.0°F | Wt 195.1 lb

## 2021-10-24 DIAGNOSIS — R5383 Other fatigue: Secondary | ICD-10-CM | POA: Diagnosis not present

## 2021-10-24 DIAGNOSIS — R1012 Left upper quadrant pain: Secondary | ICD-10-CM

## 2021-10-24 DIAGNOSIS — M25511 Pain in right shoulder: Secondary | ICD-10-CM

## 2021-10-24 DIAGNOSIS — R928 Other abnormal and inconclusive findings on diagnostic imaging of breast: Secondary | ICD-10-CM

## 2021-10-24 DIAGNOSIS — N6320 Unspecified lump in the left breast, unspecified quadrant: Secondary | ICD-10-CM | POA: Insufficient documentation

## 2021-10-24 DIAGNOSIS — R52 Pain, unspecified: Secondary | ICD-10-CM

## 2021-10-24 DIAGNOSIS — G8929 Other chronic pain: Secondary | ICD-10-CM | POA: Diagnosis not present

## 2021-10-24 MED ORDER — MELOXICAM 15 MG PO TABS: 15.0000 mg | ORAL_TABLET | Freq: Every day | ORAL | 2 refills | Status: DC

## 2021-10-24 NOTE — Patient Instructions (Signed)
Fatigue and body aches:  COVID test pending, labs  Shoulder/back/neck pain: Restart Meloxicam, continue physical therapy exercises, ice/heat as needed for pain relief  Abdominal pain: Blood work today Try to stay well hydrated and eat a high-fiber diet If any unusual findings on lab work or if pain worsens, we can consider imaging  Please contact office for sooner follow-up if symptoms do not improve or worsen. Seek emergency care if symptoms become severe.

## 2021-10-24 NOTE — Progress Notes (Signed)
Acute Office Visit  Subjective:    Patient ID: Sara [name], female    DOB: 08/07/1951, 70 y.o.   MRN: [id number]  Chief Complaint  Patient presents with   Fatigue    HPI Patient is in today for fatigue, upper body pain, abdominal pain.   Patient reports she has been feeling off for the past few weeks.  States she had shoulder surgery at the end of summer and is still going to physical therapy.  States she continues to have some right shoulder discomfort radiating into her right trap/upper back/neck.  States discomfort is worse with bending forward such as when she is washing dishes, etc.  Describes the pain as aching, but is hard to put on a pain scale.  She has been using her muscle relaxers as needed when at home, but has not been taking anything else for the discomfort -she reports that tramadol makes her too wide-awake at night.  She denies any radicular symptoms.   Additionally she reports about a week or so ago she started having some occasional left-sided abdominal discomfort with mild nausea.  States it comes and goes but does not occur every day.  Today she reports 0 out of 10 pain.  She reports that about 2 nights ago she had 4/10 sharp left lower quadrant pain radiating to left upper quadrant pain preceding a large, loose bowel movement.  States pain was immediately resolved.  She denies any other episodes of loose stool or diarrhea, chest pain, shortness of breath, vomiting.  She denies any blood in her stool, urine, other abdominal pain, rashes, fevers.  She does report some occasional " sweats," mild nausea, and increasing fatigue. States she has had a BM about every other day, no more severe pain.        Past Medical History:  Diagnosis Date   Allergic rhinitis    Anxiety    Asthma    Complete rotator cuff tear or rupture of right shoulder, not specified as traumatic    Fibromyalgia    [name]   GERD (gastroesophageal reflux disease)    Hyperlipidemia     Hypertension    Insomnia 2000   Lumbar spondylosis    OSA (obstructive sleep apnea)    PONV (postoperative nausea and vomiting)    Postmenopausal bleeding    Rotator cuff impingement syndrome of left shoulder 07/20/2021   Vaginal Pap smear, abnormal     Past Surgical History:  Procedure Laterality Date   ADENOIDECTOMY     BREAST BIOPSY  1980   neg   [place] N/A 02/17/2020   Procedure: DILATATION & CURETTAGE/HYSTEROSCOPY WITH MYOSURE POLYPECTOMY;  Surgeon: Emily Filbert, MD;  Location: [place];  Service: Gynecology;  Laterality: N/A;  rep will be here   REDUCTION MAMMAPLASTY Bilateral    SHOULDER ARTHROSCOPY WITH OPEN ROTATOR CUFF REPAIR AND DISTAL CLAVICLE ACROMINECTOMY Right 07/28/2021   Procedure: RIGHT SHOULDER ARTHROSCOPY WITH OPEN ROTATOR CUFF REPAIR, ACROMIOPLASTY AND DISTAL CLAVICLE ARTHROSCOPIC DEBRIDEMENT, LEFT SHOULDER INTRA-ARTICULAR INJECTION;  Surgeon: Earlie Server, MD;  Location: [place];  Service: Orthopedics;  Laterality: Right;  left shoulder injection   TONSILLECTOMY     TOTAL KNEE ARTHROPLASTY Left 2000, 2015    Dr. 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    Has grown children, daughter in [place].   Walks for exercise.   Caffeine- 1 c daily   Social Determinants of Health   Financial Resource Strain: Not on file  Food Insecurity: Not on file  Transportation Needs: Not on file  Physical Activity: Not on file  Stress: Not on file  Social Connections: Not on file  Intimate Partner Violence: Not on file    Outpatient Medications Prior to Visit  Medication Sig Dispense Refill   amLODipine (NORVASC) 10 MG tablet Take 1 tablet (10 mg total) by mouth daily. 30 tablet 11   aspirin 81 MG tablet Take 81 mg by mouth daily.     atorvastatin (LIPITOR) 10 MG tablet TAKE 1 TABLET (10 MG TOTAL) BY MOUTH DAILY AT 6 PM. 90 tablet 3   azelastine (ASTELIN) 0.1 % nasal spray Place 1-2 sprays into both nostrils 2 (two) times daily as needed (nasal drainage). Use in each nostril as directed 30 mL 5   Calcium Carbonate-Vitamin D 600-400 MG-UNIT tablet Take 1 tablet by mouth 2 (two) times daily. 60 tablet 11   Carbinoxamine Maleate 4 MG TABS Take 1 to 1.5 tablet twice a day as needed for allergies 60 tablet 3   DEXILANT 60 MG capsule Take 1 capsule by mouth daily.     diazepam (VALIUM) 5 MG tablet TAKE 1 TABLET BY MOUTH EVERY 8 HOURS AS NEEDED FOR ANXIETY 30 tablet 0   EPINEPHrine 0.3 mg/0.3 mL IJ SOAJ injection Inject 0.3 mg into the muscle as needed for anaphylaxis. 1 each 1   FLOVENT HFA 110 MCG/ACT inhaler INHALE 2 PUFFS INTO THE LUNGS 2 TIMES DAILY. WITH SPACER. RINSE MOUTH AFTER USE.NEEDS APPOINTMENT 12 g 2   fluticasone (FLONASE) 50 MCG/ACT nasal spray Place into the nose.     gabapentin (NEURONTIN) 800 MG tablet TAKE 1 TABLET BY MOUTH  THREE TIMES A DAY 270 tablet 1   KLOR-CON M20 20 MEQ tablet TAKE 1 TABLET BY MOUTH EVERY DAY 90 tablet 3   levalbuterol (XOPENEX HFA) 45 MCG/ACT inhaler Inhale 2 puffs into the lungs every 6 (six) hours as needed for wheezing or shortness of breath. 15 each 1   linaclotide (LINZESS) 145 MCG CAPS capsule Take by mouth.     lisinopril-hydrochlorothiazide (ZESTORETIC) 20-25 MG tablet TAKE 1 TABLET BY MOUTH EVERY DAY 90 tablet 3   metoprolol  succinate (TOPROL-XL) 100 MG 24 hr tablet TAKE 1 TABLET BY MOUTH DAILY. TAKE WITH OR IMMEDIATELY FOLLOWING A MEAL. 90 tablet 1   montelukast (SINGULAIR) 10 MG tablet Take 1 tablet (10 mg total) by mouth at bedtime. 30 tablet 5   tiZANidine (ZANAFLEX) 4 MG tablet TAKE 1 TABLET BY MOUTH NIGHTLY AS NEEDED. 90 tablet 3   traZODone (DESYREL) 150 MG tablet TAKE 1 TABLET (150 MG TOTAL) BY MOUTH AT BEDTIME. 90 tablet 3   zolpidem (AMBIEN) 10 MG tablet TAKE 1 TABLET BY MOUTH EVERYDAY AT BEDTIME 90 tablet 0   No facility-administered medications prior to visit.    Allergies  Allergen Reactions   Bee Venom Anaphylaxis    Review of Systems All review of systems negative except what is listed in the HPI     Objective:    Physical Exam Vitals reviewed.  Constitutional:      Appearance: Normal appearance.  HENT:     Head: Normocephalic and atraumatic.  Neck:     Comments: Right traps tight, tender to palpation Cardiovascular:     Rate and Rhythm: Normal rate and regular rhythm.     Heart sounds: Normal heart sounds.  Pulmonary:     Breath sounds: Normal breath sounds.  Abdominal:     General: Abdomen is flat. Bowel sounds are normal. There is no distension.     Palpations: Abdomen is soft. There is no mass.     Tenderness: There is no right CVA tenderness, left CVA tenderness, guarding or rebound.     Comments: Faint tenderness with LUQ/LLQ palpation, but no significant discomfort  Musculoskeletal:        General: Normal range of motion.     Cervical  back: Neck supple. No tenderness.  Lymphadenopathy:     Cervical: No cervical adenopathy.  Skin:    General: Skin is warm and dry.     Findings: No rash.  Neurological:     Mental Status: She is alert and oriented to person, place, and time.  Psychiatric:        Mood and Affect: Mood normal.        Behavior: Behavior normal.        Thought Content: Thought content normal.        Judgment: Judgment normal.    BP 114/79 (BP Location: Left Arm, Patient Position: Sitting, Cuff Size: Large)   Pulse 66   Temp 98 F (36.7 C) (Oral)   Wt 195 lb 1.3 oz (88.5 kg)   BMI 34.56 kg/m  Wt Readings from Last 3 Encounters:  10/24/21 195 lb 1.3 oz (88.5 kg)  10/10/21 203 lb (92.1 kg)  10/02/21 202 lb 6.4 oz (91.8 kg)    There are no preventive care reminders to display for this patient.  There are no preventive care reminders to display for this patient.   Lab Results  Component Value Date   TSH 2.73 03/30/2021   Lab Results  Component Value Date   WBC 9.9 06/23/2021   HGB 13.9 06/23/2021   HCT 41.2 06/23/2021   MCV 94.5 06/23/2021   PLT 370 06/23/2021   Lab Results  Component Value Date   NA 139 07/24/2021   K 4.3 07/24/2021   CO2 27 07/24/2021   GLUCOSE 108 (H) 07/24/2021   BUN 15 07/24/2021   CREATININE 0.99 07/24/2021   BILITOT 0.5 06/23/2021   ALKPHOS 52 09/20/2016   AST 17 06/23/2021   ALT 11 06/23/2021   PROT 7.2 06/23/2021  ALBUMIN 4.5 09/20/2016   CALCIUM 9.9 07/24/2021   ANIONGAP 7 07/24/2021   GFR 71.55 07/30/2011   Lab Results  Component Value Date   CHOL 156 03/30/2021   Lab Results  Component Value Date   HDL 72 03/30/2021   Lab Results  Component Value Date   LDLCALC 71 03/30/2021   Lab Results  Component Value Date   TRIG 52 03/30/2021   Lab Results  Component Value Date   CHOLHDL 2.2 03/30/2021   Lab Results  Component Value Date   HGBA1C 5.5 03/30/2021       Assessment & Plan:   1. Body aches COVID test pending, labs  -  Novel Coronavirus, NAA ([facility]) - meloxicam (MOBIC) 15 MG tablet; Take 1 tablet (15 mg total) by mouth daily.  Dispense: 30 tablet; Refill: 2 - COMPLETE METABOLIC PANEL WITH GFR  2. Left upper quadrant abdominal pain No red flags on exam. Pain is not consistent and not severe. No pain today. Blood work today Try to stay well hydrated and eat a high-fiber diet If any unusual findings on lab work or if pain worsens, we can consider imaging - CBC - TSH - Lipase - Amylase - COMPLETE METABOLIC PANEL WITH GFR  3. Fatigue, unspecified type Labs today - CBC - TSH - COMPLETE METABOLIC PANEL WITH GFR  4. Chronic right shoulder pain Restart Meloxicam, continue physical therapy exercises, ice/heat as needed for pain relief - meloxicam (MOBIC) 15 MG tablet; Take 1 tablet (15 mg total) by mouth daily.  Dispense: 30 tablet; Refill: 2  Please contact office for sooner follow-up if symptoms do not improve or worsen. Seek emergency care if symptoms become severe.  Recommend f/u with PCP in abuot 4-6 weeks or as needed.   Purcell Nails Olevia Bowens, DNP, FNP-C

## 2021-10-24 NOTE — Progress Notes (Signed)
[id number]

## 2021-10-25 LAB — CBC
HCT: 42.8 % (ref 35.0–45.0)
Hemoglobin: 14.5 g/dL (ref 11.7–15.5)
MCH: 31.3 pg (ref 27.0–33.0)
MCHC: 33.9 g/dL (ref 32.0–36.0)
MCV: 92.4 fL (ref 80.0–100.0)
MPV: 9.8 fL (ref 7.5–12.5)
Platelets: 354 10*3/uL (ref 140–400)
RBC: 4.63 10*6/uL (ref 3.80–5.10)
RDW: 12.9 % (ref 11.0–15.0)
WBC: 11.8 10*3/uL — ABNORMAL HIGH (ref 3.8–10.8)

## 2021-10-25 LAB — COMPLETE METABOLIC PANEL WITH GFR
AG Ratio: 1.5 (calc) (ref 1.0–2.5)
ALT: 16 U/L (ref 6–29)
AST: 22 U/L (ref 10–35)
Albumin: 4.7 g/dL (ref 3.6–5.1)
Alkaline phosphatase (APISO): 73 U/L (ref 37–153)
BUN: 24 mg/dL (ref 7–25)
CO2: 27 mmol/L (ref 20–32)
Calcium: 10.5 mg/dL — ABNORMAL HIGH (ref 8.6–10.4)
Chloride: 99 mmol/L (ref 98–110)
Creat: 1.05 mg/dL (ref 0.50–1.05)
Globulin: 3.1 g/dL (calc) (ref 1.9–3.7)
Glucose, Bld: 94 mg/dL (ref 65–99)
Potassium: 4.1 mmol/L (ref 3.5–5.3)
Sodium: 138 mmol/L (ref 135–146)
Total Bilirubin: 1 mg/dL (ref 0.2–1.2)
Total Protein: 7.8 g/dL (ref 6.1–8.1)
eGFR: 58 mL/min/{1.73_m2} — ABNORMAL LOW (ref >=60)

## 2021-10-25 LAB — TSH: TSH: 1.38 mIU/L (ref 0.40–4.50)

## 2021-10-25 LAB — LIPASE: Lipase: 29 U/L (ref 7–60)

## 2021-10-25 LAB — AMYLASE: Amylase: 38 U/L (ref 21–101)

## 2021-10-26 ENCOUNTER — Ambulatory Visit (INDEPENDENT_AMBULATORY_CARE_PROVIDER_SITE_OTHER): Payer: Medicare PPO | Admitting: *Deleted

## 2021-10-26 DIAGNOSIS — R1012 Left upper quadrant pain: Secondary | ICD-10-CM

## 2021-10-26 DIAGNOSIS — J309 Allergic rhinitis, unspecified: Secondary | ICD-10-CM | POA: Diagnosis not present

## 2021-10-26 DIAGNOSIS — R531 Weakness: Secondary | ICD-10-CM | POA: Diagnosis not present

## 2021-10-26 DIAGNOSIS — M75121 Complete rotator cuff tear or rupture of right shoulder, not specified as traumatic: Secondary | ICD-10-CM | POA: Diagnosis not present

## 2021-10-26 LAB — SARS-COV-2, NAA 2 DAY TAT

## 2021-10-26 LAB — NOVEL CORONAVIRUS, NAA: SARS-CoV-2, NAA: NOT DETECTED

## 2021-10-26 NOTE — Telephone Encounter (Signed)
Per patient communication via MyChart, she would like to proceed with Abd [place] as lab work was non-revealing other than mildly elevated WBC. See previous note for HPI. Will order [place]. Patient to call imaging to schedule.  Purcell Nails Olevia Bowens, DNP, FNP-C

## 2021-10-30 ENCOUNTER — Other Ambulatory Visit: Payer: Self-pay

## 2021-10-30 ENCOUNTER — Ambulatory Visit (INDEPENDENT_AMBULATORY_CARE_PROVIDER_SITE_OTHER): Payer: Medicare PPO

## 2021-10-30 DIAGNOSIS — R1012 Left upper quadrant pain: Secondary | ICD-10-CM

## 2021-10-30 DIAGNOSIS — K7689 Other specified diseases of liver: Secondary | ICD-10-CM | POA: Diagnosis not present

## 2021-11-01 ENCOUNTER — Ambulatory Visit (INDEPENDENT_AMBULATORY_CARE_PROVIDER_SITE_OTHER): Payer: Medicare PPO | Admitting: Sports Medicine

## 2021-11-01 VITALS — BP 135/87 | HR 65 | Ht 63.0 in | Wt 198.0 lb

## 2021-11-01 DIAGNOSIS — Z0000 Encounter for general adult medical examination without abnormal findings: Secondary | ICD-10-CM | POA: Diagnosis not present

## 2021-11-01 NOTE — Patient Instructions (Addendum)
[facility] Maintenance Summary and Written Plan of Care  Sara [name] ,  Thank you for allowing me to perform your Medicare Annual Wellness Visit and for your ongoing commitment to your health.   Health Maintenance & Immunization History Health Maintenance  Topic Date Due   COVID-19 Vaccine (6 - Booster for [name] series) 11/14/2021   TETANUS/TDAP  03/04/2022   MAMMOGRAM  10/19/2023   COLONOSCOPY (Pts 45-83yrs Insurance coverage will need to be confirmed)  04/19/2030   Pneumonia Vaccine 80+ Years old  Completed   INFLUENZA VACCINE  Completed   DEXA SCAN  Completed   Hepatitis C Screening  Completed   Zoster Vaccines- Shingrix  Completed   HPV VACCINES  Aged Out   Immunization History  Administered Date(s) Administered   Fluad Quad(high Dose 65+) 09/10/2019, 08/31/2020   Influenza Split 09/27/2011, 09/23/2012   Influenza Whole 10/11/2010   Influenza, High Dose Seasonal PF 09/19/2021   Influenza,inj,Quad PF,6+ Mos 09/24/2013, 10/20/2014, 09/21/2015, 09/05/2016, 09/26/2017, 09/09/2018   Influenza-Unspecified 09/18/2021   PFIZER Comirnaty(Gray Top)Covid-19 Tri-Sucrose Vaccine 04/10/2021, 09/19/2021   PFIZER(Purple Top)SARS-COV-2 Vaccination 01/22/2020, 03/01/2020, 09/27/2020   Pfizer Covid-19 Vaccine Bivalent Booster 6yrs & up 09/19/2021   Pneumococcal Conjugate-13 03/26/2017, 07/15/2018   Pneumococcal Polysaccharide-23 03/04/2012, 08/10/2019   Tdap 03/04/2012   Zoster Recombinat (Shingrix) 02/21/2021, 04/17/2021   Zoster, Live 08/31/2015    These are the patient goals that we discussed:  Goals Addressed               This Visit's Progress     Patient Stated (pt-stated)        Wants to work on her mental health.         This is a list of Health Maintenance Items that are overdue or due now: Screening mammography - scheduled.  Orders/Referrals Placed Today: No orders of the defined types were placed in this encounter.  (Contact our  referral department at [phone] if you have not spoken with someone about your referral appointment within the next 5 days)    Follow-up Plan Follow-up with Silverio Decamp, MD as planned Mammogram has been scheduled. Medicare wellness in one year. AVS printed and given to the patient.      Health Maintenance, Female Adopting a healthy lifestyle and getting preventive care are important in promoting health and wellness. Ask your health care provider about: The right schedule for you to have regular tests and exams. Things you can do on your own to prevent diseases and keep yourself healthy. What should I know about diet, weight, and exercise? Eat a healthy diet  Eat a diet that includes plenty of vegetables, fruits, low-fat dairy products, and lean protein. Do not eat a lot of foods that are high in solid fats, added sugars, or sodium. Maintain a healthy weight Body mass index (BMI) is used to identify weight problems. It estimates body fat based on height and weight. Your health care provider can help determine your BMI and help you achieve or maintain a healthy weight. Get regular exercise Get regular exercise. This is one of the most important things you can do for your health. Most adults should: Exercise for at least 150 minutes each week. The exercise should increase your heart rate and make you sweat (moderate-intensity exercise). Do strengthening exercises at least twice a week. This is in addition to the moderate-intensity exercise. Spend less time sitting. Even light physical activity can be beneficial. Watch cholesterol and blood lipids Have your blood tested  for lipids and cholesterol at 68 years of age, then have this test every 5 years. Have your cholesterol levels checked more often if: Your lipid or cholesterol levels are high. You are older than 68 years of age. You are at high risk for heart disease. What should I know about cancer screening? Depending  on your health history and family history, you may need to have cancer screening at various ages. This may include screening for: Breast cancer. Cervical cancer. Colorectal cancer. Skin cancer. Lung cancer. What should I know about heart disease, diabetes, and high blood pressure? Blood pressure and heart disease High blood pressure causes heart disease and increases the risk of stroke. This is more likely to develop in people who have high blood pressure readings, are of African descent, or are overweight. Have your blood pressure checked: Every 3-5 years if you are 65-27 years of age. Every year if you are 46 years old or older. Diabetes Have regular diabetes screenings. This checks your fasting blood sugar level. Have the screening done: Once every three years after age 26 if you are at a normal weight and have a low risk for diabetes. More often and at a younger age if you are overweight or have a high risk for diabetes. What should I know about preventing infection? Hepatitis B If you have a higher risk for hepatitis B, you should be screened for this virus. Talk with your health care provider to find out if you are at risk for hepatitis B infection. Hepatitis C Testing is recommended for: Everyone born from [birth year] through 1963. Anyone with known risk factors for hepatitis C. Sexually transmitted infections (STIs) Get screened for STIs, including gonorrhea and chlamydia, if: You are sexually active and are younger than 68 years of age. You are older than 68 years of age and your health care provider tells you that you are at risk for this type of infection. Your sexual activity has changed since you were last screened, and you are at increased risk for chlamydia or gonorrhea. Ask your health care provider if you are at risk. Ask your health care provider about whether you are at high risk for HIV. Your health care provider may recommend a prescription medicine to help prevent HIV  infection. If you choose to take medicine to prevent HIV, you should first get tested for HIV. You should then be tested every 3 months for as long as you are taking the medicine. Pregnancy If you are about to stop having your period (premenopausal) and you may become pregnant, seek counseling before you get pregnant. Take 400 to 800 micrograms (mcg) of folic acid every day if you become pregnant. Ask for birth control (contraception) if you want to prevent pregnancy. Osteoporosis and menopause Osteoporosis is a disease in which the bones lose minerals and strength with aging. This can result in bone fractures. If you are 39 years old or older, or if you are at risk for osteoporosis and fractures, ask your health care provider if you should: Be screened for bone loss. Take a calcium or vitamin D supplement to lower your risk of fractures. Be given hormone replacement therapy (HRT) to treat symptoms of menopause. Follow these instructions at home: Lifestyle Do not use any products that contain nicotine or tobacco, such as cigarettes, e-cigarettes, and chewing tobacco. If you need help quitting, ask your health care provider. Do not use street drugs. Do not share needles. Ask your health care provider for help if you  need support or information about quitting drugs. Alcohol use Do not drink alcohol if: Your health care provider tells you not to drink. You are pregnant, may be pregnant, or are planning to become pregnant. If you drink alcohol: Limit how much you use to 0-1 drink a day. Limit intake if you are breastfeeding. Be aware of how much alcohol is in your drink. In the [place] one drink equals one 12 oz bottle of beer (355 mL), one 5 oz glass of wine (148 mL), or one 1 oz glass of hard liquor (44 mL). General instructions Schedule regular health, dental, and eye exams. Stay current with your vaccines. Tell your health care provider if: You often feel depressed. You have ever been  abused or do not feel safe at home. Summary Adopting a healthy lifestyle and getting preventive care are important in promoting health and wellness. Follow your health care provider's instructions about healthy diet, exercising, and getting tested or screened for diseases. Follow your health care provider's instructions on monitoring your cholesterol and blood pressure. This information is not intended to replace advice given to you by your health care provider. Make sure you discuss any questions you have with your health care provider. Document Revised: 02/24/2021 Document Reviewed: 12/10/2018 Elsevier Patient Education  2022 [employer].

## 2021-11-01 NOTE — Progress Notes (Signed)
MEDICARE ANNUAL WELLNESS VISIT  11/01/2021  Subjective:  Sara [name] is a 70 y.o. female patient of [name], [name], MD who had a Medicare Annual Wellness Visit today. Sara [name] is Retired and lives with their spouse. she has 1 child and 3 step-children. she reports that she is socially active and does interact with friends/family regularly. she is minimally physically active and enjoys reading, meditating, yoga and gardening.  Patient Care Team: Silverio Decamp, MD as PCP - General (Family Medicine)  Advanced Directives 11/01/2021 07/28/2021 07/24/2021 02/17/2020 02/10/2020 07/22/2017  Does Patient Have a Medical Advance Directive? Yes Yes Yes Yes Yes Yes  Type of Paramedic of [place];Living will Living will;Healthcare Power of Attorney Living will;Healthcare Power of [place];Living will - [place];Living will  Does patient want to make changes to medical advance directive? No - Patient declined No - Patient declined No - Patient declined No - Patient declined - -  Copy of [place] in Chart? No - copy requested No - copy requested No - copy requested No - copy requested No - copy requested Yes    Hospital Utilization Over the Past 12 Months: # of hospitalizations or ER visits: 1 # of surgeries: 1  Review of Systems    Patient reports that [name] overall health is worse when compared to last year.  Review of Systems: History obtained from chart review and the patient  All other systems negative.  Pain Assessment Pain : 0-10 Pain Score: 3  Pain Type: Chronic pain Pain Location: Shoulder Pain Orientation: Right Pain Radiating Towards: radiates to [name] neck. Pain Descriptors / Indicators: Sharp, Stabbing Pain Onset: More than a month ago Pain Frequency: Intermittent     Current Medications & Allergies (verified) Allergies as of 11/01/2021       Reactions   Bee Venom  Anaphylaxis        Medication List        Accurate as of November 01, 2021 10:36 AM. If you have any questions, ask your nurse or doctor.          amLODipine 10 MG tablet Commonly known as: NORVASC Take 1 tablet (10 mg total) by mouth daily.   aspirin 81 MG tablet Take 81 mg by mouth daily.   atorvastatin 10 MG tablet Commonly known as: LIPITOR TAKE 1 TABLET (10 MG TOTAL) BY MOUTH DAILY AT 6 PM.   azelastine 0.1 % nasal spray Commonly known as: ASTELIN Place 1-2 sprays into both nostrils 2 (two) times daily as needed (nasal drainage). Use in each nostril as directed   Calcium Carbonate-Vitamin D 600-400 MG-UNIT tablet Take 1 tablet by mouth 2 (two) times daily.   Carbinoxamine Maleate 4 MG Tabs Take 1 to 1.5 tablet twice a day as needed for allergies   Dexilant 60 MG capsule Generic drug: dexlansoprazole Take 1 capsule by mouth daily.   diazepam 5 MG tablet Commonly known as: VALIUM TAKE 1 TABLET BY MOUTH EVERY 8 HOURS AS NEEDED FOR ANXIETY   EPINEPHrine 0.3 mg/0.3 mL Soaj injection Commonly known as: EPI-PEN Inject 0.3 mg into the muscle as needed for anaphylaxis.   Flovent HFA 110 MCG/ACT inhaler Generic drug: fluticasone INHALE 2 PUFFS INTO THE LUNGS 2 TIMES DAILY. WITH SPACER. RINSE MOUTH AFTER USE.NEEDS APPOINTMENT   fluticasone 50 MCG/ACT nasal spray Commonly known as: FLONASE Place into the nose.   gabapentin 800 MG tablet Commonly known as: NEURONTIN TAKE 1 TABLET BY  MOUTH THREE TIMES A DAY   Klor-Con M20 20 MEQ tablet Generic drug: potassium chloride SA TAKE 1 TABLET BY MOUTH EVERY DAY   levalbuterol 45 MCG/ACT inhaler Commonly known as: XOPENEX HFA Inhale 2 puffs into the lungs every 6 (six) hours as needed for wheezing or shortness of breath.   linaclotide 145 MCG Caps capsule Commonly known as: LINZESS Take by mouth.   lisinopril-hydrochlorothiazide 20-25 MG tablet Commonly known as: ZESTORETIC TAKE 1 TABLET BY MOUTH EVERY DAY    meloxicam 15 MG tablet Commonly known as: MOBIC Take 1 tablet (15 mg total) by mouth daily.   metoprolol succinate 100 MG 24 hr tablet Commonly known as: TOPROL-XL TAKE 1 TABLET BY MOUTH DAILY. TAKE WITH OR IMMEDIATELY FOLLOWING A MEAL.   montelukast 10 MG tablet Commonly known as: Singulair Take 1 tablet (10 mg total) by mouth at bedtime.   tiZANidine 4 MG tablet Commonly known as: ZANAFLEX TAKE 1 TABLET BY MOUTH NIGHTLY AS NEEDED.   traZODone 150 MG tablet Commonly known as: DESYREL TAKE 1 TABLET (150 MG TOTAL) BY MOUTH AT BEDTIME.   zolpidem 10 MG tablet Commonly known as: AMBIEN TAKE 1 TABLET BY MOUTH EVERYDAY AT BEDTIME        History (reviewed): Past Medical History:  Diagnosis Date   Allergic rhinitis    Allergy 2000   Bee Venom, grass   Anxiety    Asthma    Cataract 2015   Corrected surgery   Complete rotator cuff tear or rupture of right shoulder, not specified as traumatic    Fibromyalgia    [name]   GERD (gastroesophageal reflux disease)    Hyperlipidemia    Hypertension    Insomnia 2000   Lumbar spondylosis    OSA (obstructive sleep apnea)    PONV (postoperative nausea and vomiting)    Postmenopausal bleeding    Rotator cuff impingement syndrome of left shoulder 07/20/2021   Vaginal Pap smear, abnormal    Past Surgical History:  Procedure Laterality Date   ADENOIDECTOMY     BREAST BIOPSY  1980   neg   [place] N/A 02/17/2020   Procedure: DILATATION & CURETTAGE/HYSTEROSCOPY WITH MYOSURE POLYPECTOMY;  Surgeon: Emily Filbert, MD;  Location: [facility];  Service: Gynecology;  Laterality: N/A;  rep will be here   EYE SURGERY  2015   JOINT REPLACEMENT  2000   REDUCTION MAMMAPLASTY Bilateral    SHOULDER ARTHROSCOPY WITH OPEN ROTATOR CUFF REPAIR AND DISTAL CLAVICLE ACROMINECTOMY Right 07/28/2021   Procedure: RIGHT SHOULDER ARTHROSCOPY WITH OPEN ROTATOR CUFF REPAIR, ACROMIOPLASTY AND DISTAL  CLAVICLE ARTHROSCOPIC DEBRIDEMENT, LEFT SHOULDER INTRA-ARTICULAR INJECTION;  Surgeon: Sara Server, MD;  Location: [facility];  Service: Orthopedics;  Laterality: Right;  left shoulder injection   TONSILLECTOMY     TOTAL KNEE ARTHROPLASTY Left 2000, 2015    Dr. Tonita [name]   Family History  Problem Relation Age of Onset   Diabetes Mother    Heart attack Mother        First MI at age 88   Diabetes type II Mother    Coronary artery disease Mother    Hypertension Father    Diabetes Father    Alcohol abuse Father    Pancreatitis Father    Anxiety disorder Father    Early death Father    Asthma Daughter    Asthma Grandchild    Social History   Socioeconomic History   Marital status: Married    Spouse name:  Elam Dutch Sr.   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  Tobacco Use   Smoking status: Never   Smokeless tobacco: Never  Vaping Use   Vaping Use: Never used  Substance and Sexual Activity   Alcohol use: Not Currently    Comment: Occasional-Last had a drink a month ago   Drug use: No   Sexual activity: Not Currently    Partners: Male    Birth control/protection: None, Post-menopausal  Other Topics Concern   Not on file  Social History Narrative   Lives with [name] husband. She has one child and three step children. She enjoys reading, mediating, doing yoga and gardening.   Social Determinants of Health   Financial Resource Strain: Low Risk    Difficulty of Paying Living Expenses: Not hard at all  Food Insecurity: No Food Insecurity   Worried About Charity fundraiser in the Last Year: Never true   [place] in the Last Year: Never true  Transportation Needs: No Transportation Needs   Lack of  Transportation (Medical): No   Lack of Transportation (Non-Medical): No  Physical Activity: Inactive   Days of Exercise per Week: 0 days   Minutes of Exercise per Session: 0 min  Stress: No Stress Concern Present   Feeling of Stress : Not at all  Social Connections: Moderately Isolated   Frequency of Communication with Friends and Family: More than three times a week   Frequency of Social Gatherings with Friends and Family: Once a week   Attends Religious Services: Never   Marine scientist or Organizations: No   Attends Archivist Meetings: Never   Marital Status: Married    Activities of Daily Living In your present state of health, do you have any difficulty performing the following activities: 11/01/2021 07/28/2021  Hearing? N N  Comment some ringing in the ears. -  Vision? Y N  Comment has been having difficulty with peripheral vision. -  Difficulty concentrating or making decisions? N N  Walking or climbing stairs? Y N  Dressing or bathing? N N  Doing errands, shopping? N -  Preparing Food and eating ? N -  Using the Toilet? N -  In the past six months, have you accidently leaked urine? Y -  Comment she has been given the passary but she does not use it; she has leaked when she sneezes too hard or cough. -  Do you have problems with loss of bowel control? N -  Managing your Medications? N -  Managing your Finances? N -  Housekeeping or managing your Housekeeping? N -  Comment has a friend who helps with it. -  Some recent data might be hidden    Patient Education/Literacy How often do you need to have someone help you when you read instructions, pamphlets, or other written materials from your doctor or pharmacy?: 1 - Never What is the last grade level you completed in school?: 2 Masters Degrees  Exercise Current Exercise Habits: Home exercise routine, Type of exercise: stretching, Time (Minutes): 30, Frequency (Times/Week): 4, Weekly Exercise  (Minutes/Week): 120, Intensity: Mild, Exercise limited by: orthopedic condition(s)  Diet Patient reports consuming  1-2  meals a day and 0-1  snack(s) a day Patient reports that [name] primary diet is: Regular Patient reports that she does have regular access to food.   Depression Screen PHQ 2/9 Scores 11/01/2021 10/10/2021 03/29/2021 03/29/2021 11/29/2020 03/18/2020 07/15/2018  PHQ - 2 Score 0 0 0 0 0 0 1  PHQ- 9 Score - - 0 - - 1 1     Fall Risk Fall Risk  11/01/2021 10/10/2021 11/29/2020 03/18/2020 02/03/2020  Falls in the past year? 0 0 0 1 1  Number falls in past yr: 0 0 0 0 0  Injury with Fall? 0 - 1 1 1   Risk for fall due to : No Fall Risks - - - -  Follow up Falls evaluation completed;Education provided;Falls prevention discussed - - - -     Objective:   BP 135/87 (BP Location: Right Arm, Patient Position: Sitting, Cuff Size: Normal)   Pulse 65   Ht 5\' 3"  (1.6 m)   Wt 198 lb 0.6 oz (89.8 kg)   SpO2 99%   BMI 35.08 kg/m   Last Weight  Most recent update: 11/01/2021 10:02 AM    Weight  89.8 kg (198 lb 0.6 oz)             Body mass index is 35.08 kg/m.  Hearing/Vision  Jalayla did not have difficulty with hearing/understanding during the face-to-face interview Sible did not have difficulty with [name] vision during the face-to-face interview Reports that she has had a formal eye exam by an eye care professional within the past year Reports that she has not had a formal hearing evaluation within the past year  Cognitive Function: 6CIT Screen 11/01/2021  What Year? 0 points  What month? 0 points  What time? 0 points  Count back from 20 0 points  Months in reverse 0 points  Repeat phrase 0 points  Total Score 0    Normal Cognitive Function Screening: Yes (Normal:0-7, Significant for Dysfunction: >8)  Immunization & Health Maintenance Record Immunization History  Administered Date(s) Administered   Fluad Quad(high Dose 65+) 09/10/2019, 08/31/2020   Influenza Split  09/27/2011, 09/23/2012   Influenza Whole 10/11/2010   Influenza, High Dose Seasonal PF 09/19/2021   Influenza,inj,Quad PF,6+ Mos 09/24/2013, 10/20/2014, 09/21/2015, 09/05/2016, 09/26/2017, 09/09/2018   Influenza-Unspecified 09/18/2021   PFIZER Comirnaty(Gray Top)Covid-19 Tri-Sucrose Vaccine 04/10/2021, 09/19/2021   PFIZER(Purple Top)SARS-COV-2 Vaccination 01/22/2020, 03/01/2020, 09/27/2020   Pfizer Covid-19 Vaccine Bivalent Booster 65yrs & up 09/19/2021   Pneumococcal Conjugate-13 03/26/2017, 07/15/2018   Pneumococcal Polysaccharide-23 03/04/2012, 08/10/2019   Tdap 03/04/2012   Zoster Recombinat (Shingrix) 02/21/2021, 04/17/2021   Zoster, Live 08/31/2015    Health Maintenance  Topic Date Due   COVID-19 Vaccine (6 - Booster for Pfizer series) 11/14/2021   TETANUS/TDAP  03/04/2022   MAMMOGRAM  10/19/2023   COLONOSCOPY (Pts 45-46yrs Insurance coverage will need to be confirmed)  04/19/2030   Pneumonia Vaccine 41+ Years old  Completed   INFLUENZA VACCINE  Completed   DEXA SCAN  Completed   Hepatitis C Screening  Completed   Zoster Vaccines- Shingrix  Completed   HPV VACCINES  Aged Out       Assessment  This is a routine wellness examination for [employer].  Health Maintenance: Due or Overdue There are no preventive care reminders to display for this patient.  Lennox Pippins Kingsley does not need a referral for [employer] Assistance: Care Management:   no Social Work:    no Prescription Assistance:  no Nutrition/Diabetes Education:  no   Plan:  Personalized  Goals  Goals Addressed               This Visit's Progress     Patient Stated (pt-stated)        Wants to work on [name] mental health.       Personalized Health Maintenance & Screening Recommendations  Screening mammography - scheduled.  Lung Cancer Screening Recommended: no (Low Dose CT Chest recommended if Age 7-80 years, 30 pack-year currently smoking OR have quit w/in past 15 years) Hepatitis C Screening  recommended: no HIV Screening recommended: no  Advanced Directives: Written information was not given per the patient's request.  Referrals & Orders No orders of the defined types were placed in this encounter.   Follow-up Plan Follow-up with Silverio Decamp, MD as planned Mammogram has been scheduled. Medicare wellness in one year. AVS printed and given to the patient.   I have personally reviewed and noted the following in the patient's chart:   Medical and social history Use of alcohol, tobacco or illicit drugs  Current medications and supplements Functional ability and status Nutritional status Physical activity Advanced directives List of other physicians Hospitalizations, surgeries, and ER visits in previous 12 months Vitals Screenings to include cognitive, depression, and falls Referrals and appointments  In addition, I have reviewed and discussed with patient certain preventive protocols, quality metrics, and best practice recommendations. A written personalized care plan for preventive services as well as general preventive health recommendations were provided to patient.     Tinnie Gens, RN  11/01/2021

## 2021-11-02 ENCOUNTER — Ambulatory Visit (INDEPENDENT_AMBULATORY_CARE_PROVIDER_SITE_OTHER): Payer: Medicare PPO | Admitting: *Deleted

## 2021-11-02 ENCOUNTER — Other Ambulatory Visit: Payer: Self-pay | Admitting: Sports Medicine

## 2021-11-02 ENCOUNTER — Ambulatory Visit
Admission: RE | Admit: 2021-11-02 | Discharge: 2021-11-02 | Disposition: A | Discharge status: Home / Self Care | Payer: Medicare PPO | Source: Ambulatory Visit | Attending: Sports Medicine | Admitting: Sports Medicine

## 2021-11-02 ENCOUNTER — Other Ambulatory Visit: Payer: Self-pay

## 2021-11-02 DIAGNOSIS — R921 Mammographic calcification found on diagnostic imaging of breast: Secondary | ICD-10-CM | POA: Diagnosis not present

## 2021-11-02 DIAGNOSIS — R928 Other abnormal and inconclusive findings on diagnostic imaging of breast: Secondary | ICD-10-CM

## 2021-11-02 DIAGNOSIS — J309 Allergic rhinitis, unspecified: Secondary | ICD-10-CM | POA: Diagnosis not present

## 2021-11-02 DIAGNOSIS — M75121 Complete rotator cuff tear or rupture of right shoulder, not specified as traumatic: Secondary | ICD-10-CM | POA: Diagnosis not present

## 2021-11-02 DIAGNOSIS — R922 Inconclusive mammogram: Secondary | ICD-10-CM | POA: Diagnosis not present

## 2021-11-02 DIAGNOSIS — R531 Weakness: Secondary | ICD-10-CM | POA: Diagnosis not present

## 2021-11-03 ENCOUNTER — Ambulatory Visit (INDEPENDENT_AMBULATORY_CARE_PROVIDER_SITE_OTHER): Payer: Medicare PPO

## 2021-11-03 ENCOUNTER — Ambulatory Visit: Payer: Medicare PPO | Admitting: Sports Medicine

## 2021-11-03 DIAGNOSIS — M160 Bilateral primary osteoarthritis of hip: Secondary | ICD-10-CM | POA: Diagnosis not present

## 2021-11-03 NOTE — Assessment & Plan Note (Signed)
Recurrence of right hip pain, known osteoarthritis, last injected in February this year, repeat right hip joint injection today, return as needed.

## 2021-11-03 NOTE — Progress Notes (Signed)
    Procedures performed today:    Procedure: Real-time Ultrasound Guided injection of the right hip joint Device: Samsung HS60  Verbal informed consent obtained.  Time-out conducted.  Noted no overlying erythema, induration, or other signs of local infection.  Skin prepped in a sterile fashion.  Local anesthesia: Topical Ethyl chloride.  With sterile technique and under real time ultrasound guidance: Noted arthritic hip, 1 cc Kenalog 40, 2 cc lidocaine, 2 cc bupivacaine injected easily Completed without difficulty  Advised to call if fevers/chills, erythema, induration, drainage, or persistent bleeding.  Images permanently stored and available for review in PACS.  Impression: Technically successful ultrasound guided injection.  Independent interpretation of notes and tests performed by another provider:   None.  Brief History, Exam, Impression, and Recommendations:    Primary osteoarthritis of both hips Recurrence of right hip pain, known osteoarthritis, last injected in February this year, repeat right hip joint injection today, return as needed.    ___________________________________________ [name]. [name], M.D., ABFM., CAQSM. Primary Care and [place] Instructor of [employer] [facility]

## 2021-11-08 DIAGNOSIS — M545 Low back pain, unspecified: Secondary | ICD-10-CM | POA: Diagnosis not present

## 2021-11-08 DIAGNOSIS — M25519 Pain in unspecified shoulder: Secondary | ICD-10-CM | POA: Diagnosis not present

## 2021-11-08 DIAGNOSIS — M542 Cervicalgia: Secondary | ICD-10-CM | POA: Diagnosis not present

## 2021-11-13 DIAGNOSIS — H53459 Other localized visual field defect, unspecified eye: Secondary | ICD-10-CM | POA: Diagnosis not present

## 2021-11-13 DIAGNOSIS — H53481 Generalized contraction of visual field, right eye: Secondary | ICD-10-CM | POA: Diagnosis not present

## 2021-11-13 DIAGNOSIS — H53483 Generalized contraction of visual field, bilateral: Secondary | ICD-10-CM | POA: Diagnosis not present

## 2021-11-13 DIAGNOSIS — H53482 Generalized contraction of visual field, left eye: Secondary | ICD-10-CM | POA: Diagnosis not present

## 2021-11-15 ENCOUNTER — Ambulatory Visit (INDEPENDENT_AMBULATORY_CARE_PROVIDER_SITE_OTHER): Payer: Medicare PPO

## 2021-11-15 DIAGNOSIS — J309 Allergic rhinitis, unspecified: Secondary | ICD-10-CM

## 2021-11-16 ENCOUNTER — Other Ambulatory Visit: Payer: Self-pay

## 2021-11-16 ENCOUNTER — Encounter: Payer: Self-pay | Admitting: Obstetrics and Gynecology

## 2021-11-16 DIAGNOSIS — B379 Candidiasis, unspecified: Secondary | ICD-10-CM

## 2021-11-16 MED ORDER — FLUCONAZOLE 150 MG PO TABS: 150.0000 mg | ORAL_TABLET | Freq: Once | ORAL | 0 refills | Status: AC

## 2021-11-16 NOTE — Progress Notes (Signed)
Diflucan sent per Dr Elly Modena.

## 2021-11-17 ENCOUNTER — Other Ambulatory Visit: Payer: Self-pay | Admitting: Sports Medicine

## 2021-11-17 DIAGNOSIS — M75121 Complete rotator cuff tear or rupture of right shoulder, not specified as traumatic: Secondary | ICD-10-CM | POA: Diagnosis not present

## 2021-11-17 DIAGNOSIS — R531 Weakness: Secondary | ICD-10-CM | POA: Diagnosis not present

## 2021-11-20 ENCOUNTER — Other Ambulatory Visit: Payer: Self-pay | Admitting: Sports Medicine

## 2021-11-20 DIAGNOSIS — G4700 Insomnia, unspecified: Secondary | ICD-10-CM

## 2021-11-21 ENCOUNTER — Ambulatory Visit (INDEPENDENT_AMBULATORY_CARE_PROVIDER_SITE_OTHER): Payer: Medicare PPO

## 2021-11-21 DIAGNOSIS — J309 Allergic rhinitis, unspecified: Secondary | ICD-10-CM

## 2021-11-21 DIAGNOSIS — R531 Weakness: Secondary | ICD-10-CM | POA: Diagnosis not present

## 2021-11-21 DIAGNOSIS — M75121 Complete rotator cuff tear or rupture of right shoulder, not specified as traumatic: Secondary | ICD-10-CM | POA: Diagnosis not present

## 2021-11-27 ENCOUNTER — Ambulatory Visit (INDEPENDENT_AMBULATORY_CARE_PROVIDER_SITE_OTHER): Payer: Medicare PPO

## 2021-11-27 ENCOUNTER — Other Ambulatory Visit: Payer: Self-pay | Admitting: Sports Medicine

## 2021-11-27 ENCOUNTER — Encounter: Payer: Self-pay | Admitting: Sports Medicine

## 2021-11-27 DIAGNOSIS — J309 Allergic rhinitis, unspecified: Secondary | ICD-10-CM | POA: Diagnosis not present

## 2021-11-27 DIAGNOSIS — G4700 Insomnia, unspecified: Secondary | ICD-10-CM

## 2021-11-27 DIAGNOSIS — S46211D Strain of muscle, fascia and tendon of other parts of biceps, right arm, subsequent encounter: Secondary | ICD-10-CM | POA: Diagnosis not present

## 2021-11-27 NOTE — Telephone Encounter (Signed)
Left msg that prescription was sent to pharmacy as requested.  °

## 2021-12-05 ENCOUNTER — Ambulatory Visit: Payer: Medicare PPO | Admitting: Sports Medicine

## 2021-12-05 ENCOUNTER — Other Ambulatory Visit: Payer: Self-pay

## 2021-12-05 DIAGNOSIS — M160 Bilateral primary osteoarthritis of hip: Secondary | ICD-10-CM | POA: Diagnosis not present

## 2021-12-05 DIAGNOSIS — M47812 Spondylosis without myelopathy or radiculopathy, cervical region: Secondary | ICD-10-CM

## 2021-12-05 DIAGNOSIS — M47816 Spondylosis without myelopathy or radiculopathy, lumbar region: Secondary | ICD-10-CM | POA: Diagnosis not present

## 2021-12-05 NOTE — Assessment & Plan Note (Signed)
Sara [name] does have a complex pain history, cervical spondylosis with multilevel disc disease, she did relatively well with some cervical epidurals in the past. Last cervical epidural was in 2020, I will order repeat, left-sided C6-C7 interlaminar. Return to see me as needed 4 to 6 weeks after the epidural injection. Continue tizanidine, tramadol.

## 2021-12-05 NOTE — Assessment & Plan Note (Addendum)
I did a right hip injection approximately 1 month ago, she still has some discomfort right groin, understands that the next step would be hip arthroplasty but she is willing to hold off for now.

## 2021-12-05 NOTE — Assessment & Plan Note (Signed)
Sara [name] returns, she is a pleasant 70 year old female, multilevel lumbar spondylosis, she did really well with bilateral L3-S1 facet radiofrequency ablation back in 2019, she is now having recurrence of pain, axial, mostly right-sided, feels similar to before so I do think we will proceed again with medial branch blocks in anticipation of a right-sided L3-S1 facet RFA.

## 2021-12-05 NOTE — Progress Notes (Signed)
    Procedures performed today:    None.  Independent interpretation of notes and tests performed by another provider:   None.  Brief History, Exam, Impression, and Recommendations:    Lumbar spondylosis Sara [name] returns, she is a pleasant 70 year old female, multilevel lumbar spondylosis, she did really well with bilateral L3-S1 facet radiofrequency ablation back in 2019, she is now having recurrence of pain, axial, mostly right-sided, feels similar to before so I do think we will proceed again with medial branch blocks in anticipation of a right-sided L3-S1 facet RFA.   Cervical spondylosis with fibromyalgia Sara [name] does have a complex pain history, cervical spondylosis with multilevel disc disease, she did relatively well with some cervical epidurals in the past. Last cervical epidural was in 2020, I will order repeat, left-sided C6-C7 interlaminar. Return to see me as needed 4 to 6 weeks after the epidural injection. Continue tizanidine, tramadol.  Primary osteoarthritis of both hips I did a right hip injection approximately 1 month ago, she still has some discomfort right groin, understands that the next step would be hip arthroplasty but she is willing to hold off for now.    ___________________________________________ Sara [name]. Sara [name], M.D., ABFM., CAQSM. Primary Care and [place] Instructor of [place] [facility]

## 2021-12-06 DIAGNOSIS — R531 Weakness: Secondary | ICD-10-CM | POA: Diagnosis not present

## 2021-12-06 DIAGNOSIS — M75121 Complete rotator cuff tear or rupture of right shoulder, not specified as traumatic: Secondary | ICD-10-CM | POA: Diagnosis not present

## 2021-12-11 ENCOUNTER — Ambulatory Visit (INDEPENDENT_AMBULATORY_CARE_PROVIDER_SITE_OTHER): Payer: Medicare PPO

## 2021-12-11 DIAGNOSIS — J309 Allergic rhinitis, unspecified: Secondary | ICD-10-CM

## 2021-12-11 DIAGNOSIS — R531 Weakness: Secondary | ICD-10-CM | POA: Diagnosis not present

## 2021-12-11 DIAGNOSIS — M75121 Complete rotator cuff tear or rupture of right shoulder, not specified as traumatic: Secondary | ICD-10-CM | POA: Diagnosis not present

## 2021-12-12 ENCOUNTER — Encounter: Payer: Self-pay | Admitting: Sports Medicine

## 2021-12-18 ENCOUNTER — Ambulatory Visit (INDEPENDENT_AMBULATORY_CARE_PROVIDER_SITE_OTHER): Payer: Medicare PPO

## 2021-12-18 DIAGNOSIS — J309 Allergic rhinitis, unspecified: Secondary | ICD-10-CM | POA: Diagnosis not present

## 2021-12-21 ENCOUNTER — Ambulatory Visit
Admission: RE | Admit: 2021-12-21 | Discharge: 2021-12-21 | Disposition: A | Discharge status: Home / Self Care | Payer: Medicare PPO | Source: Ambulatory Visit | Attending: Sports Medicine | Admitting: Sports Medicine

## 2021-12-21 ENCOUNTER — Other Ambulatory Visit: Payer: Self-pay

## 2021-12-21 DIAGNOSIS — M47812 Spondylosis without myelopathy or radiculopathy, cervical region: Secondary | ICD-10-CM | POA: Diagnosis not present

## 2021-12-21 MED ORDER — TRIAMCINOLONE ACETONIDE 40 MG/ML IJ SUSP (RADIOLOGY)
60.0000 mg | Freq: Once | INTRAMUSCULAR | Status: AC
  Administered 2021-12-21: 11:00:00 60 mg via EPIDURAL

## 2021-12-21 MED ORDER — IOPAMIDOL (ISOVUE-M 200) INJECTION 41%
1.0000 mL | Freq: Once | INTRAMUSCULAR | Status: AC
  Administered 2021-12-21: 11:00:00 1 mL via EPIDURAL

## 2021-12-21 NOTE — Discharge Instructions (Signed)

## 2021-12-26 ENCOUNTER — Ambulatory Visit (INDEPENDENT_AMBULATORY_CARE_PROVIDER_SITE_OTHER): Payer: Medicare PPO | Admitting: *Deleted

## 2021-12-26 DIAGNOSIS — R531 Weakness: Secondary | ICD-10-CM | POA: Diagnosis not present

## 2021-12-26 DIAGNOSIS — J309 Allergic rhinitis, unspecified: Secondary | ICD-10-CM

## 2021-12-26 DIAGNOSIS — M75121 Complete rotator cuff tear or rupture of right shoulder, not specified as traumatic: Secondary | ICD-10-CM | POA: Diagnosis not present

## 2022-01-01 NOTE — Progress Notes (Deleted)
Follow Up Note  RE: Sara [name] MRN: [id number] DOB: 1951/10/20 Date of Office Visit: 01/02/2022  Referring provider: Silverio Decamp Primary care provider: Silverio Decamp, MD  Chief Complaint: No chief complaint on file.  History of Present Illness: I had the pleasure of seeing Sara [name] for a follow up visit at the [facility] on 01/01/2022. She is a 71 y.o. female, who is being followed for asthma, allergic rhinitis on AIT, hymenoptera allergy and heartburn. Her previous allergy office visit was on 10/02/2021 with Althea Charon FNP. Today is a regular follow up visit.  1. Asthma, unspecified asthma severity, unspecified whether complicated, unspecified whether persistent   2. Allergic rhinitis, unspecified seasonality, unspecified trigger   3. Cough, unspecified type   4. Anaphylaxis due to hymenoptera venom, undetermined intent, subsequent encounter   5. Heartburn       No orders of the defined types were placed in this encounter.     Patient Instructions    Seasonal and perennial allergic rhinitis Continue environmental control measures directed towards: grass, ragweed, weed pollen, mold, cat, and borderline dog Continue carbinoxamine 4mg  (1 to 1.5 tablet) twice a day as needed. Continue azelastine nasal spray 1-2 sprays per nostril twice a day as needed for runny nose/drainage. May use Flonase (fluticasone) nasal spray 1 spray per nostril twice a day as needed for nasal congestion.   May useNasal saline spray (i.e., Simply Saline) or nasal saline lavage (i.e., NeilMed) is recommended as needed and prior to medicated nasal sprays. Continue Singulair (montelukast) 10mg  daily at night. Continue allergy injections per protocol and have access to EpiPen    Asthma May use levoalbuterol rescue inhaler 2 puffs or nebulizer every 4 to 6 hours as needed for shortness of breath, chest tightness, coughing, and wheezing. Monitor frequency of  use.  Stop Flovent 110 mcg      Coughing-stable The most common causes of chronic cough include the following: upper airway cough syndrome (UACS) which is caused by variety of rhinitis conditions; asthma; gastroesophageal reflux disease (GERD); chronic bronchitis from cigarette smoking or other inhaled environmental irritants; non-asthmatic eosinophilic bronchitis; and bronchiectasis.    Heartburn Continue lifestyle and dietary modifications. Continue Dexilant as prescribed.    Anaphylaxis due to hymenoptera venom Avoid bee stings. In case of an allergic reaction, give Benadryl 4 teaspoonfuls every 4 hours, and if life-threatening symptoms occur, inject with EpiPen 0.3 mg.   Other allergic rhinitis Perennial rhinoconjunctivitis symptoms for many years but noticed worsening the past year.  Was allergy tested over 5 years ago which showed multiple positives per patient report.  No prior allergy immunotherapy. Today's skin testing showed: Positive to grass pollen, ragweed pollen, weed pollen, mold, cat, and borderline to dog.  Start environmental control measures as below. Take carbinoxamine 4mg  (1 to 1.5 tablet) twice a day as needed. May use azelastine nasal spray 1-2 sprays per nostril twice a day as needed for runny nose/drainage. May use Flonase (fluticasone) nasal spray 1 spray per nostril twice a day as needed for nasal congestion.  Nasal saline spray (i.e., Simply Saline) or nasal saline lavage (i.e., NeilMed) is recommended as needed and prior to medicated nasal sprays. Continue Singulair (montelukast) 10mg  daily at night. Consider allergy injections for long term control if above medications do not help the symptoms - handout given.    Asthma Diagnosed with asthma over 20 years ago and has weekly coughing, shortness of breath and chest tightness.  Does not like  to use albuterol as it causes palpitations. Today's spirometry was normal. May use levoalbuterol rescue inhaler 2 puffs  or nebulizer every 4 to 6 hours as needed for shortness of breath, chest tightness, coughing, and wheezing. Monitor frequency of use.    Coughing Coughing especially in the mornings.  Patient does take PPI for reflux and is on an ACE inhibitor for hypertension. The most common causes of chronic cough include the following: upper airway cough syndrome (UACS) which is caused by variety of rhinitis conditions; asthma; gastroesophageal reflux disease (GERD); chronic bronchitis from cigarette smoking or other inhaled environmental irritants; non-asthmatic eosinophilic bronchitis; and bronchiectasis.  In prospective studies, these conditions have accounted for up to 94% of the causes of chronic cough in immunocompetent adults.  The history and physical examination suggest that her cough is multifactorial with contribution possibly from reflux and PND.    Heartburn See below for lifestyle and dietary modifications. Continue Dexilant as prescribed.   Anaphylaxis due to hymenoptera venom Throat tightness and trouble breathing in the past which required EpiPen use once.  No prior work-up. Continue avoidance.  For mild symptoms you can take over the counter antihistamines such as Benadryl and monitor symptoms closely. If symptoms worsen or if you have severe symptoms including breathing issues, throat closure, significant swelling, whole body hives, severe diarrhea and vomiting, lightheadedness then inject epinephrine and seek immediate medical care afterwards. Action plan given. Get bloodwork as below.   Assessment and Plan: Sara [name] is a 71 y.o. female with: No problem-specific Assessment & Plan notes found for this encounter.  No follow-ups on file.  No orders of the defined types were placed in this encounter.  Lab Orders  No laboratory test(s) ordered today    Diagnostics: Spirometry:  Tracings reviewed. Her effort: {Blank single:19197::"Good reproducible efforts.","It was hard to get  consistent efforts and there is a question as to whether this reflects a maximal maneuver.","Poor effort, data can not be interpreted."} FVC: ***L FEV1: ***L, ***% predicted FEV1/FVC ratio: ***% Interpretation: {Blank single:19197::"Spirometry consistent with mild obstructive disease","Spirometry consistent with moderate obstructive disease","Spirometry consistent with severe obstructive disease","Spirometry consistent with possible restrictive disease","Spirometry consistent with mixed obstructive and restrictive disease","Spirometry uninterpretable due to technique","Spirometry consistent with normal pattern","No overt abnormalities noted given today's efforts"}.  Please see scanned spirometry results for details.  Skin Testing: {Blank single:19197::"Select foods","Environmental allergy panel","Environmental allergy panel and select foods","Food allergy panel","None","Deferred due to recent antihistamines use"}. *** Results discussed with patient/family.   Medication List:  Current Outpatient Medications  Medication Sig Dispense Refill   amLODipine (NORVASC) 10 MG tablet Take 1 tablet (10 mg total) by mouth daily. 30 tablet 11   aspirin 81 MG tablet Take 81 mg by mouth daily.     atorvastatin (LIPITOR) 10 MG tablet TAKE 1 TABLET (10 MG TOTAL) BY MOUTH DAILY AT 6 PM. 90 tablet 3   azelastine (ASTELIN) 0.1 % nasal spray Place 1-2 sprays into both nostrils 2 (two) times daily as needed (nasal drainage). Use in each nostril as directed 30 mL 5   Calcium Carbonate-Vitamin D 600-400 MG-UNIT tablet Take 1 tablet by mouth 2 (two) times daily. 60 tablet 11   Carbinoxamine Maleate 4 MG TABS Take 1 to 1.5 tablet twice a day as needed for allergies 60 tablet 3   DEXILANT 60 MG capsule Take 1 capsule by mouth daily.     diazepam (VALIUM) 5 MG tablet TAKE 1 TABLET BY MOUTH EVERY 8 HOURS AS NEEDED FOR ANXIETY 30 tablet 0  EPINEPHrine 0.3 mg/0.3 mL IJ SOAJ injection Inject 0.3 mg into the muscle as needed  for anaphylaxis. 1 each 1   FLOVENT HFA 110 MCG/ACT inhaler INHALE 2 PUFFS INTO THE LUNGS 2 TIMES DAILY. WITH SPACER. RINSE MOUTH AFTER USE.NEEDS APPOINTMENT 12 g 2   fluticasone (FLONASE) 50 MCG/ACT nasal spray Place into the nose.     gabapentin (NEURONTIN) 800 MG tablet TAKE 1 TABLET BY MOUTH THREE TIMES A DAY 270 tablet 1   KLOR-CON M20 20 MEQ tablet TAKE 1 TABLET BY MOUTH EVERY DAY 90 tablet 3   levalbuterol (XOPENEX HFA) 45 MCG/ACT inhaler Inhale 2 puffs into the lungs every 6 (six) hours as needed for wheezing or shortness of breath. 15 each 1   linaclotide (LINZESS) 145 MCG CAPS capsule Take by mouth.     lisinopril-hydrochlorothiazide (ZESTORETIC) 20-25 MG tablet TAKE 1 TABLET BY MOUTH EVERY DAY 90 tablet 3   meloxicam (MOBIC) 15 MG tablet Take 1 tablet (15 mg total) by mouth daily. 30 tablet 2   metoprolol succinate (TOPROL-XL) 100 MG 24 hr tablet TAKE 1 TABLET BY MOUTH DAILY. TAKE WITH OR IMMEDIATELY FOLLOWING A MEAL. 90 tablet 1   montelukast (SINGULAIR) 10 MG tablet Take 1 tablet (10 mg total) by mouth at bedtime. 30 tablet 5   tiZANidine (ZANAFLEX) 4 MG tablet TAKE 1 TABLET BY MOUTH NIGHTLY AS NEEDED. 90 tablet 3   traZODone (DESYREL) 150 MG tablet TAKE 1 TABLET (150 MG TOTAL) BY MOUTH AT BEDTIME. 90 tablet 1   zolpidem (AMBIEN) 10 MG tablet TAKE 1 TABLET BY MOUTH EVERYDAY AT BEDTIME 90 tablet 0   No current facility-administered medications for this visit.   Allergies: Allergies  Allergen Reactions   Bee Venom Anaphylaxis   I reviewed her past medical history, social history, family history, and environmental history and no significant changes have been reported from her previous visit.  Review of Systems  Objective: There were no vitals taken for this visit. There is no height or weight on file to calculate BMI. Physical Exam Previous notes and tests were reviewed. The plan was reviewed with the patient/family, and all questions/concerned were addressed.  It was my  pleasure to see Sara [name] today and participate in her care. Please feel free to contact me with any questions or concerns.  Sincerely,  Rexene Alberts, DO Allergy & Immunology  [facility] office: [place] office: [phone]

## 2022-01-02 ENCOUNTER — Ambulatory Visit
Admission: RE | Admit: 2022-01-02 | Discharge: 2022-01-02 | Disposition: A | Discharge status: Home / Self Care | Payer: Medicare PPO | Source: Ambulatory Visit | Attending: Sports Medicine | Admitting: Sports Medicine

## 2022-01-02 ENCOUNTER — Other Ambulatory Visit: Payer: Self-pay | Admitting: Sports Medicine

## 2022-01-02 ENCOUNTER — Ambulatory Visit: Payer: Medicare PPO | Admitting: Allergy

## 2022-01-02 DIAGNOSIS — M47817 Spondylosis without myelopathy or radiculopathy, lumbosacral region: Secondary | ICD-10-CM | POA: Diagnosis not present

## 2022-01-02 DIAGNOSIS — M545 Low back pain, unspecified: Secondary | ICD-10-CM | POA: Diagnosis not present

## 2022-01-02 DIAGNOSIS — M47816 Spondylosis without myelopathy or radiculopathy, lumbar region: Secondary | ICD-10-CM

## 2022-01-02 NOTE — Discharge Instructions (Signed)

## 2022-01-03 NOTE — Progress Notes (Deleted)
Follow Up Note  RE: Sara [name] MRN: [id number] DOB: 21-May-1951 Date of Office Visit: 01/04/2022  Referring provider: Silverio Decamp Primary care provider: Silverio Decamp, MD  Chief Complaint: No chief complaint on file.  History of Present Illness: I had the pleasure of seeing Sara [name] for a follow up visit at the [facility] [place] on 01/03/2022. She is a 71 y.o. female, who is being followed for allergic rhinitis on AIT, asthma, hymenoptera allergy. Her previous allergy office visit was on 10/02/2021 with Althea Charon FNP. Today is a regular follow up visit.  Seasonal and perennial allergic rhinitis Continue environmental control measures directed towards: grass, ragweed, weed pollen, mold, cat, and borderline dog Continue carbinoxamine 4mg  (1 to 1.5 tablet) twice a day as needed. Continue azelastine nasal spray 1-2 sprays per nostril twice a day as needed for runny nose/drainage. May use Flonase (fluticasone) nasal spray 1 spray per nostril twice a day as needed for nasal congestion.   May useNasal saline spray (i.e., Simply Saline) or nasal saline lavage (i.e., NeilMed) is recommended as needed and prior to medicated nasal sprays. Continue Singulair (montelukast) 10mg  daily at night. Continue allergy injections per protocol and have access to EpiPen    Asthma May use levoalbuterol rescue inhaler 2 puffs or nebulizer every 4 to 6 hours as needed for shortness of breath, chest tightness, coughing, and wheezing. Monitor frequency of use.  Stop Flovent 110 mcg      Coughing-stable The most common causes of chronic cough include the following: upper airway cough syndrome (UACS) which is caused by variety of rhinitis conditions; asthma; gastroesophageal reflux disease (GERD); chronic bronchitis from cigarette smoking or other inhaled environmental irritants; non-asthmatic eosinophilic bronchitis; and bronchiectasis.    Heartburn Continue  lifestyle and dietary modifications. Continue Dexilant as prescribed.    Anaphylaxis due to hymenoptera venom Avoid bee stings. In case of an allergic reaction, give Benadryl 4 teaspoonfuls every 4 hours, and if life-threatening symptoms occur, inject with EpiPen 0.3 mg.   Speak with your primary care physician about your palpitations  Other allergic rhinitis Perennial rhinoconjunctivitis symptoms for many years but noticed worsening the past year.  Was allergy tested over 5 years ago which showed multiple positives per patient report.  No prior allergy immunotherapy. Today's skin testing showed: Positive to grass pollen, ragweed pollen, weed pollen, mold, cat, and borderline to dog.  Start environmental control measures as below. Take carbinoxamine 4mg  (1 to 1.5 tablet) twice a day as needed. May use azelastine nasal spray 1-2 sprays per nostril twice a day as needed for runny nose/drainage. May use Flonase (fluticasone) nasal spray 1 spray per nostril twice a day as needed for nasal congestion.  Nasal saline spray (i.e., Simply Saline) or nasal saline lavage (i.e., NeilMed) is recommended as needed and prior to medicated nasal sprays. Continue Singulair (montelukast) 10mg  daily at night. Consider allergy injections for long term control if above medications do not help the symptoms - handout given.    Asthma Diagnosed with asthma over 20 years ago and has weekly coughing, shortness of breath and chest tightness.  Does not like to use albuterol as it causes palpitations. Today's spirometry was normal. May use levoalbuterol rescue inhaler 2 puffs or nebulizer every 4 to 6 hours as needed for shortness of breath, chest tightness, coughing, and wheezing. Monitor frequency of use.    Coughing Coughing especially in the mornings.  Patient does take PPI for reflux and is on an  ACE inhibitor for hypertension. The most common causes of chronic cough include the following: upper airway cough  syndrome (UACS) which is caused by variety of rhinitis conditions; asthma; gastroesophageal reflux disease (GERD); chronic bronchitis from cigarette smoking or other inhaled environmental irritants; non-asthmatic eosinophilic bronchitis; and bronchiectasis.  In prospective studies, these conditions have accounted for up to 94% of the causes of chronic cough in immunocompetent adults.  The history and physical examination suggest that her cough is multifactorial with contribution possibly from reflux and PND.    Heartburn See below for lifestyle and dietary modifications. Continue Dexilant as prescribed.   Anaphylaxis due to hymenoptera venom Throat tightness and trouble breathing in the past which required EpiPen use once.  No prior work-up. Continue avoidance.  For mild symptoms you can take over the counter antihistamines such as Benadryl and monitor symptoms closely. If symptoms worsen or if you have severe symptoms including breathing issues, throat closure, significant swelling, whole body hives, severe diarrhea and vomiting, lightheadedness then inject epinephrine and seek immediate medical care afterwards. Action plan given. Get bloodwork as below.   Bloodwork was negative to stinging insect panel. Still continue to avoid and have Epipen on hand if needed for allergic reactions.    Assessment and Plan: Sara [name] is a 71 y.o. female with: No problem-specific Assessment & Plan notes found for this encounter.  No follow-ups on file.  No orders of the defined types were placed in this encounter.  Lab Orders  No laboratory test(s) ordered today    Diagnostics: Spirometry:  Tracings reviewed. Her effort: {Blank single:19197::"Good reproducible efforts.","It was hard to get consistent efforts and there is a question as to whether this reflects a maximal maneuver.","Poor effort, data can not be interpreted."} FVC: ***L FEV1: ***L, ***% predicted FEV1/FVC ratio: ***% Interpretation:  {Blank single:19197::"Spirometry consistent with mild obstructive disease","Spirometry consistent with moderate obstructive disease","Spirometry consistent with severe obstructive disease","Spirometry consistent with possible restrictive disease","Spirometry consistent with mixed obstructive and restrictive disease","Spirometry uninterpretable due to technique","Spirometry consistent with normal pattern","No overt abnormalities noted given today's efforts"}.  Please see scanned spirometry results for details.  Skin Testing: {Blank single:19197::"Select foods","Environmental allergy panel","Environmental allergy panel and select foods","Food allergy panel","None","Deferred due to recent antihistamines use"}. *** Results discussed with patient/family.   Medication List:  Current Outpatient Medications  Medication Sig Dispense Refill   amLODipine (NORVASC) 10 MG tablet Take 1 tablet (10 mg total) by mouth daily. 30 tablet 11   aspirin 81 MG tablet Take 81 mg by mouth daily.     atorvastatin (LIPITOR) 10 MG tablet TAKE 1 TABLET (10 MG TOTAL) BY MOUTH DAILY AT 6 PM. 90 tablet 3   azelastine (ASTELIN) 0.1 % nasal spray Place 1-2 sprays into both nostrils 2 (two) times daily as needed (nasal drainage). Use in each nostril as directed 30 mL 5   Calcium Carbonate-Vitamin D 600-400 MG-UNIT tablet Take 1 tablet by mouth 2 (two) times daily. 60 tablet 11   Carbinoxamine Maleate 4 MG TABS Take 1 to 1.5 tablet twice a day as needed for allergies 60 tablet 3   DEXILANT 60 MG capsule Take 1 capsule by mouth daily.     diazepam (VALIUM) 5 MG tablet TAKE 1 TABLET BY MOUTH EVERY 8 HOURS AS NEEDED FOR ANXIETY 30 tablet 0   EPINEPHrine 0.3 mg/0.3 mL IJ SOAJ injection Inject 0.3 mg into the muscle as needed for anaphylaxis. 1 each 1   FLOVENT HFA 110 MCG/ACT inhaler INHALE 2 PUFFS INTO THE LUNGS 2 TIMES DAILY.  WITH SPACER. RINSE MOUTH AFTER USE.NEEDS APPOINTMENT 12 g 2   fluticasone (FLONASE) 50 MCG/ACT nasal spray  Place into the nose.     gabapentin (NEURONTIN) 800 MG tablet TAKE 1 TABLET BY MOUTH THREE TIMES A DAY 270 tablet 1   KLOR-CON M20 20 MEQ tablet TAKE 1 TABLET BY MOUTH EVERY DAY 90 tablet 3   levalbuterol (XOPENEX HFA) 45 MCG/ACT inhaler Inhale 2 puffs into the lungs every 6 (six) hours as needed for wheezing or shortness of breath. 15 each 1   linaclotide (LINZESS) 145 MCG CAPS capsule Take by mouth.     lisinopril-hydrochlorothiazide (ZESTORETIC) 20-25 MG tablet TAKE 1 TABLET BY MOUTH EVERY DAY 90 tablet 3   meloxicam (MOBIC) 15 MG tablet Take 1 tablet (15 mg total) by mouth daily. 30 tablet 2   metoprolol succinate (TOPROL-XL) 100 MG 24 hr tablet TAKE 1 TABLET BY MOUTH DAILY. TAKE WITH OR IMMEDIATELY FOLLOWING A MEAL. 90 tablet 1   montelukast (SINGULAIR) 10 MG tablet Take 1 tablet (10 mg total) by mouth at bedtime. 30 tablet 5   tiZANidine (ZANAFLEX) 4 MG tablet TAKE 1 TABLET BY MOUTH NIGHTLY AS NEEDED. 90 tablet 3   traZODone (DESYREL) 150 MG tablet TAKE 1 TABLET (150 MG TOTAL) BY MOUTH AT BEDTIME. 90 tablet 1   zolpidem (AMBIEN) 10 MG tablet TAKE 1 TABLET BY MOUTH EVERYDAY AT BEDTIME 90 tablet 0   No current facility-administered medications for this visit.   Allergies: Allergies  Allergen Reactions   Bee Venom Anaphylaxis   I reviewed her past medical history, social history, family history, and environmental history and no significant changes have been reported from her previous visit.  Review of Systems  Constitutional:  Negative for appetite change, chills, fever and unexpected weight change.  HENT:  Negative for congestion and rhinorrhea.   Eyes:  Negative for itching.  Respiratory:  Negative for cough, chest tightness, shortness of breath and wheezing.   Gastrointestinal:  Negative for abdominal pain.  Skin:  Negative for rash.  Allergic/Immunologic: Positive for environmental allergies.  Neurological:  Negative for headaches.   Objective: There were no vitals taken for  this visit. There is no height or weight on file to calculate BMI. Physical Exam Vitals and nursing note reviewed.  Constitutional:      Appearance: Normal appearance. She is well-developed.  HENT:     Head: Normocephalic and atraumatic.     Right Ear: Tympanic membrane and external ear normal.     Left Ear: Tympanic membrane and external ear normal.     Nose: Nose normal.     Mouth/Throat:     Mouth: Mucous membranes are moist.     Pharynx: Oropharynx is clear.  Eyes:     Conjunctiva/sclera: Conjunctivae normal.  Cardiovascular:     Rate and Rhythm: Normal rate and regular rhythm.     Heart sounds: Normal heart sounds. No murmur heard. Pulmonary:     Effort: Pulmonary effort is normal.     Breath sounds: Normal breath sounds. No wheezing, rhonchi or rales.  Musculoskeletal:     Cervical back: Neck supple.  Skin:    General: Skin is warm.     Findings: No rash.  Neurological:     Mental Status: She is alert and oriented to person, place, and time.  Psychiatric:        Behavior: Behavior normal.   Previous notes and tests were reviewed. The plan was reviewed with the patient/family, and all questions/concerned were addressed.  It was my pleasure to see Sara [name] today and participate in her care. Please feel free to contact me with any questions or concerns.  Sincerely,  Rexene Alberts, DO Allergy & Immunology  [facility] office: [place] office: [phone]

## 2022-01-04 ENCOUNTER — Other Ambulatory Visit: Payer: Self-pay | Admitting: Sports Medicine

## 2022-01-04 ENCOUNTER — Ambulatory Visit: Payer: Medicare PPO | Admitting: Allergy

## 2022-01-04 ENCOUNTER — Ambulatory Visit (INDEPENDENT_AMBULATORY_CARE_PROVIDER_SITE_OTHER): Payer: Medicare PPO

## 2022-01-04 DIAGNOSIS — Z91038 Other insect allergy status: Secondary | ICD-10-CM

## 2022-01-04 DIAGNOSIS — H1010 Acute atopic conjunctivitis, unspecified eye: Secondary | ICD-10-CM

## 2022-01-04 DIAGNOSIS — J309 Allergic rhinitis, unspecified: Secondary | ICD-10-CM | POA: Diagnosis not present

## 2022-01-04 DIAGNOSIS — R12 Heartburn: Secondary | ICD-10-CM

## 2022-01-04 DIAGNOSIS — M47816 Spondylosis without myelopathy or radiculopathy, lumbar region: Secondary | ICD-10-CM

## 2022-01-09 ENCOUNTER — Ambulatory Visit
Admission: RE | Admit: 2022-01-09 | Discharge: 2022-01-09 | Disposition: A | Discharge status: Home / Self Care | Payer: Medicare PPO | Source: Ambulatory Visit | Attending: Sports Medicine | Admitting: Sports Medicine

## 2022-01-09 ENCOUNTER — Other Ambulatory Visit: Payer: Self-pay

## 2022-01-09 ENCOUNTER — Other Ambulatory Visit: Payer: Self-pay | Admitting: Sports Medicine

## 2022-01-09 DIAGNOSIS — M47816 Spondylosis without myelopathy or radiculopathy, lumbar region: Secondary | ICD-10-CM

## 2022-01-09 DIAGNOSIS — M545 Low back pain, unspecified: Secondary | ICD-10-CM | POA: Diagnosis not present

## 2022-01-09 MED ORDER — MIDAZOLAM HCL 2 MG/2ML IJ SOLN
1.0000 mg | INTRAMUSCULAR | Status: DC | PRN
  Administered 2022-01-09 (×3): 0.5 mg via INTRAVENOUS

## 2022-01-09 MED ORDER — KETOROLAC TROMETHAMINE 30 MG/ML IJ SOLN
30.0000 mg | Freq: Once | INTRAMUSCULAR | Status: AC
  Administered 2022-01-09: 30 mg via INTRAVENOUS

## 2022-01-09 MED ORDER — SODIUM CHLORIDE 0.9 % IV SOLN: INTRAVENOUS | Status: DC

## 2022-01-09 MED ORDER — METHYLPREDNISOLONE ACETATE 40 MG/ML INJ SUSP (RADIOLOG
80.0000 mg | Freq: Once | INTRAMUSCULAR | Status: AC
  Administered 2022-01-09: 80 mg via INTRA_ARTICULAR

## 2022-01-09 MED ORDER — FENTANYL CITRATE PF 50 MCG/ML IJ SOSY
25.0000 ug | PREFILLED_SYRINGE | INTRAMUSCULAR | Status: DC | PRN
  Administered 2022-01-09: 25 ug via INTRAVENOUS

## 2022-01-09 NOTE — Progress Notes (Signed)
Pt back in nursing recovery area post RFA. Pt alert and oriented. Talking in complete sentences. Has no complaints at this time. Will continue to monitor until discharged by radiologist.

## 2022-01-09 NOTE — Discharge Instructions (Signed)
Radio Frequency Ablation Post Procedure Discharge Instructions ? ?May resume a regular diet and any medications that you routinely take (including pain medications). ?No driving day of procedure. ?Upon discharge go home and rest for at least 4 hours.  May use an ice pack as needed to injection sites on back. ?Remove bandades later, today. ? ? ? ?Please contact our office at [phone] for the following symptoms: ? ?Fever greater than 100 degrees ?Increased swelling, pain, or redness at injection site. ? ? ?Thank you for visiting [facility].  ?

## 2022-01-11 ENCOUNTER — Ambulatory Visit (INDEPENDENT_AMBULATORY_CARE_PROVIDER_SITE_OTHER): Payer: Medicare PPO

## 2022-01-11 DIAGNOSIS — J309 Allergic rhinitis, unspecified: Secondary | ICD-10-CM

## 2022-01-16 ENCOUNTER — Ambulatory Visit (INDEPENDENT_AMBULATORY_CARE_PROVIDER_SITE_OTHER): Payer: Medicare PPO

## 2022-01-16 DIAGNOSIS — J309 Allergic rhinitis, unspecified: Secondary | ICD-10-CM | POA: Diagnosis not present

## 2022-01-17 ENCOUNTER — Encounter: Payer: Self-pay | Admitting: Sports Medicine

## 2022-01-24 ENCOUNTER — Encounter: Payer: Self-pay | Admitting: Sports Medicine

## 2022-01-24 ENCOUNTER — Ambulatory Visit (INDEPENDENT_AMBULATORY_CARE_PROVIDER_SITE_OTHER): Payer: Medicare PPO

## 2022-01-24 DIAGNOSIS — G4733 Obstructive sleep apnea (adult) (pediatric): Secondary | ICD-10-CM

## 2022-01-24 DIAGNOSIS — M47812 Spondylosis without myelopathy or radiculopathy, cervical region: Secondary | ICD-10-CM

## 2022-01-24 DIAGNOSIS — J309 Allergic rhinitis, unspecified: Secondary | ICD-10-CM

## 2022-01-29 DIAGNOSIS — H02831 Dermatochalasis of right upper eyelid: Secondary | ICD-10-CM | POA: Diagnosis not present

## 2022-01-29 DIAGNOSIS — H02423 Myogenic ptosis of bilateral eyelids: Secondary | ICD-10-CM | POA: Diagnosis not present

## 2022-01-29 DIAGNOSIS — H02834 Dermatochalasis of left upper eyelid: Secondary | ICD-10-CM | POA: Diagnosis not present

## 2022-02-01 ENCOUNTER — Ambulatory Visit: Payer: Medicare PPO | Admitting: Allergy

## 2022-02-05 ENCOUNTER — Ambulatory Visit (INDEPENDENT_AMBULATORY_CARE_PROVIDER_SITE_OTHER): Payer: Medicare PPO

## 2022-02-05 DIAGNOSIS — J309 Allergic rhinitis, unspecified: Secondary | ICD-10-CM

## 2022-02-12 ENCOUNTER — Ambulatory Visit (INDEPENDENT_AMBULATORY_CARE_PROVIDER_SITE_OTHER): Payer: Medicare PPO

## 2022-02-12 DIAGNOSIS — J309 Allergic rhinitis, unspecified: Secondary | ICD-10-CM

## 2022-02-14 NOTE — Progress Notes (Signed)
Follow Up Note  RE: Sara [name] MRN: [id number] DOB: 04-28-1951 Date of Office Visit: 02/15/2022  Referring provider: Silverio Decamp Primary care provider: Silverio Decamp, MD  Chief Complaint: Allergies  History of Present Illness: I had the pleasure of seeing Sara [name] for a follow up visit at the [facility] [place] on 02/15/2022. She is a 71 y.o. female, who is being followed for allergic rhinitis on AIT, asthma, heartburn, hymenoptera allergy. Her previous allergy office visit was on 10/02/2021 with Althea Charon FNP. Today is a regular follow up visit.  Seasonal and perennial allergic rhinitis Started AIT on 08/29/2021 and  no localized reactions.  Currently taking benadryl prn, nasal spray as needed. No nosebleeds.  Taking carbinoxamine 1 tablet daily which seems to control her symptoms.   Uses restasis for dry eyes.  She likes to get her injections at our [place] office.   Itching:  Patient states she has some extreme itching on her upper extremities b/l since she had an accident. She had PT, dry needling for this with no benefit. No rash with this.  This started before she started allergy injections. Already on gabapentin, and uses a topical anesthetic creams as needed.  She had EMG done in 2021 which was unremarkable.    Asthma Denies any SOB, coughing, wheezing, chest tightness, nocturnal awakenings, ER/urgent care visits or prednisone use since the last visit. No rescue inhaler use.   Heartburn Changed to a new PPI lansoprazole and Dexilant was too expensive and it is still controlling her symptoms.    Hymenoptera No stings since the last visit.   Assessment and Plan: Sara [name] is a 71 y.o. female with: Seasonal and perennial allergic rhinoconjunctivitis Past history - Perennial rhinoconjunctivitis symptoms for many years but noticed worsening the past year. 2022 skin testing showed: Positive to grass pollen, ragweed pollen, weed  pollen, mold, cat, and borderline to dog. Started AIT on 08/29/2021 (G-RW-T, M-C-D) Interim history - stable with below regimen. Continue environmental control measures.  Continue allergy injections.  Take carbinoxamine 4mg  (1 to 1.5 tablet) twice a day as needed. May use azelastine nasal spray 1-2 sprays per nostril twice a day as needed for runny nose/drainage. May use Flonase (fluticasone) nasal spray 1 spray per nostril twice a day as needed for nasal congestion.  Nasal saline spray (i.e., Simply Saline) or nasal saline lavage (i.e., NeilMed) is recommended as needed and prior to medicated nasal sprays. Continue Singulair (montelukast) 10mg  daily at night.  Asthma Past history - Diagnosed with asthma over 20 years ago and has weekly coughing, shortness of breath and chest tightness.  Does not like to use albuterol as it causes palpitations. 2022 spirometry was normal. Interim history - no rescue inhaler use.  May use levoalbuterol rescue inhaler 2 puffs every 4 to 6 hours as needed for shortness of breath, chest tightness, coughing, and wheezing. Monitor frequency of use.   GERD (gastroesophageal reflux disease) Controlled.  Continue lifestyle and dietary modifications. Continue lansoprazole 30mg  daily as prescribed.  Hymenoptera allergy Past history - Throat tightness and trouble breathing in the past which required EpiPen use once.  2022 bloodwork negative. Interim history - No stings since last visit.  Avoid bee stings. For mild symptoms you can take over the counter antihistamines such as Benadryl and monitor symptoms closely. If symptoms worsen or if you have severe symptoms including breathing issues, throat closure, significant swelling, whole body hives, severe diarrhea and vomiting, lightheadedness then inject epinephrine and  seek immediate medical care afterwards. Action plan in place. If has another reaction - consider undergoing skin testing which is done in our [facility]  office once a month as there are a small subset of people who react via skin testing but have negative bloodwork.   Pruritus Pruritus only on upper extremities b/l since she had an accident. No associated rash. Not worse since started AIT. Had PT, EMG. On Gabapentin, topical lidocaine. Discussed with patient that her symptoms do not seem to be allergy related regarding the itching.  Continue to use the lidocaine topical as needed. Moisturize daily.  Return in about 1 year (around 02/15/2023).  Meds ordered this encounter  Medications   montelukast (SINGULAIR) 10 MG tablet    Sig: Take 1 tablet (10 mg total) by mouth at bedtime.    Dispense:  30 tablet    Refill:  5   fluticasone (FLONASE) 50 MCG/ACT nasal spray    Sig: Place 2 sprays into both nostrils daily as needed for allergies or rhinitis.    Dispense:  16 mL    Refill:  5   azelastine (ASTELIN) 0.1 % nasal spray    Sig: Place 1-2 sprays into both nostrils 2 (two) times daily as needed (nasal drainage). Use in each nostril as directed    Dispense:  30 mL    Refill:  5   Lab Orders  No laboratory test(s) ordered today    Diagnostics: Spirometry:  Tracings reviewed. Her effort: Good reproducible efforts. FVC: 2.81L FEV1: 2.01L, 112% predicted FEV1/FVC ratio: 72% Interpretation: Spirometry consistent with normal pattern.  Please see scanned spirometry results for details.  Medication List:  Current Outpatient Medications  Medication Sig Dispense Refill   amLODipine (NORVASC) 10 MG tablet Take 1 tablet (10 mg total) by mouth daily. 30 tablet 11   atorvastatin (LIPITOR) 10 MG tablet TAKE 1 TABLET (10 MG TOTAL) BY MOUTH DAILY AT 6 PM. 90 tablet 3   bimatoprost (LATISSE) 0.03 % ophthalmic solution bimatoprost 0.03 % drops with applicator, eyelash base  PLEASE SEE ATTACHED FOR DETAILED DIRECTIONS     Calcium Carbonate-Vitamin D 600-400 MG-UNIT tablet Take 1 tablet by mouth 2 (two) times daily. 60 tablet 11   Carbinoxamine  Maleate 4 MG TABS Take 1 to 1.5 tablet twice a day as needed for allergies 60 tablet 3   cycloSPORINE (RESTASIS) 0.05 % ophthalmic emulsion Restasis 0.05 % eye drops in a dropperette  LOCATION: BOTH EYES. INSTILL ONE DROP IN EACH EYE ONCE IN THE MORNING AND ONCE IN THE EVENING.     diazepam (VALIUM) 5 MG tablet TAKE 1 TABLET BY MOUTH EVERY 8 HOURS AS NEEDED FOR ANXIETY 30 tablet 0   EPINEPHrine 0.3 mg/0.3 mL IJ SOAJ injection Inject 0.3 mg into the muscle as needed for anaphylaxis. 1 each 1   FLOVENT HFA 110 MCG/ACT inhaler INHALE 2 PUFFS INTO THE LUNGS 2 TIMES DAILY. WITH SPACER. RINSE MOUTH AFTER USE.NEEDS APPOINTMENT 12 g 2   gabapentin (NEURONTIN) 800 MG tablet TAKE 1 TABLET BY MOUTH THREE TIMES A DAY 270 tablet 1   KLOR-CON M20 20 MEQ tablet TAKE 1 TABLET BY MOUTH EVERY DAY 90 tablet 3   lansoprazole (PREVACID) 30 MG capsule Take by mouth.     levalbuterol (XOPENEX HFA) 45 MCG/ACT inhaler Inhale 2 puffs into the lungs every 6 (six) hours as needed for wheezing or shortness of breath. 15 each 1   lidocaine (XYLOCAINE) 5 % ointment Apply topically.     linaclotide (  LINZESS) 145 MCG CAPS capsule Take by mouth.     lisinopril-hydrochlorothiazide (ZESTORETIC) 20-25 MG tablet TAKE 1 TABLET BY MOUTH EVERY DAY 90 tablet 3   meloxicam (MOBIC) 15 MG tablet Take 1 tablet (15 mg total) by mouth daily. 30 tablet 2   metoprolol succinate (TOPROL-XL) 100 MG 24 hr tablet TAKE 1 TABLET BY MOUTH DAILY. TAKE WITH OR IMMEDIATELY FOLLOWING A MEAL. 90 tablet 1   tiZANidine (ZANAFLEX) 4 MG tablet TAKE 1 TABLET BY MOUTH NIGHTLY AS NEEDED. 90 tablet 3   traZODone (DESYREL) 150 MG tablet TAKE 1 TABLET (150 MG TOTAL) BY MOUTH AT BEDTIME. 90 tablet 1   zolpidem (AMBIEN) 10 MG tablet TAKE 1 TABLET BY MOUTH EVERYDAY AT BEDTIME 90 tablet 0   azelastine (ASTELIN) 0.1 % nasal spray Place 1-2 sprays into both nostrils 2 (two) times daily as needed (nasal drainage). Use in each nostril as directed 30 mL 5   fluticasone  (FLONASE) 50 MCG/ACT nasal spray Place 2 sprays into both nostrils daily as needed for allergies or rhinitis. 16 mL 5   montelukast (SINGULAIR) 10 MG tablet Take 1 tablet (10 mg total) by mouth at bedtime. 30 tablet 5   No current facility-administered medications for this visit.   Allergies: Allergies  Allergen Reactions   Bee Venom Anaphylaxis   I reviewed her past medical history, social history, family history, and environmental history and no significant changes have been reported from her previous visit.  Review of Systems  Constitutional:  Negative for appetite change, chills, fever and unexpected weight change.  HENT:  Negative for congestion, postnasal drip, rhinorrhea and sneezing.   Eyes:  Negative for itching.  Respiratory:  Negative for cough, chest tightness, shortness of breath and wheezing.   Cardiovascular:  Negative for chest pain.  Gastrointestinal:  Negative for abdominal pain.  Genitourinary:  Negative for difficulty urinating.  Skin:  Negative for rash.       itching  Allergic/Immunologic: Positive for environmental allergies.  Neurological:  Negative for headaches.   Objective: BP 92/60    Pulse 70    Temp 97.6 F (36.4 C) (Temporal)    Resp 20    SpO2 98%  There is no height or weight on file to calculate BMI. Physical Exam Vitals and nursing note reviewed.  Constitutional:      Appearance: Normal appearance. She is well-developed.  HENT:     Head: Normocephalic and atraumatic.     Right Ear: Tympanic membrane and external ear normal.     Left Ear: Tympanic membrane and external ear normal.     Nose: Nose normal.     Mouth/Throat:     Mouth: Mucous membranes are moist.     Pharynx: Oropharynx is clear.  Eyes:     Conjunctiva/sclera: Conjunctivae normal.  Cardiovascular:     Rate and Rhythm: Normal rate and regular rhythm.     Heart sounds: Normal heart sounds. No murmur heard.   No friction rub. No gallop.  Pulmonary:     Effort: Pulmonary  effort is normal.     Breath sounds: Normal breath sounds. No wheezing, rhonchi or rales.  Musculoskeletal:     Cervical back: Neck supple.  Skin:    General: Skin is warm.     Findings: No rash.  Neurological:     Mental Status: She is alert and oriented to person, place, and time.  Psychiatric:        Behavior: Behavior normal.   Previous notes and tests  were reviewed. The plan was reviewed with the patient/family, and all questions/concerned were addressed.  It was my pleasure to see Sara [name] today and participate in her care. Please feel free to contact me with any questions or concerns.  Sincerely,  Rexene Alberts, DO Allergy & Immunology  [facility] office: [place] office: [phone]

## 2022-02-15 ENCOUNTER — Ambulatory Visit: Payer: Medicare PPO | Admitting: Allergy

## 2022-02-15 ENCOUNTER — Other Ambulatory Visit: Payer: Self-pay | Admitting: Sports Medicine

## 2022-02-15 ENCOUNTER — Ambulatory Visit
Admission: RE | Admit: 2022-02-15 | Discharge: 2022-02-15 | Disposition: A | Discharge status: Home / Self Care | Payer: Medicare PPO | Source: Ambulatory Visit | Attending: Sports Medicine | Admitting: Sports Medicine

## 2022-02-15 ENCOUNTER — Encounter: Payer: Self-pay | Admitting: Allergy

## 2022-02-15 ENCOUNTER — Other Ambulatory Visit: Payer: Self-pay

## 2022-02-15 VITALS — BP 92/60 | HR 70 | Temp 97.6°F | Resp 20

## 2022-02-15 DIAGNOSIS — J4520 Mild intermittent asthma, uncomplicated: Secondary | ICD-10-CM | POA: Diagnosis not present

## 2022-02-15 DIAGNOSIS — Z91038 Other insect allergy status: Secondary | ICD-10-CM | POA: Diagnosis not present

## 2022-02-15 DIAGNOSIS — K219 Gastro-esophageal reflux disease without esophagitis: Secondary | ICD-10-CM | POA: Diagnosis not present

## 2022-02-15 DIAGNOSIS — L299 Pruritus, unspecified: Secondary | ICD-10-CM | POA: Diagnosis not present

## 2022-02-15 DIAGNOSIS — R928 Other abnormal and inconclusive findings on diagnostic imaging of breast: Secondary | ICD-10-CM

## 2022-02-15 DIAGNOSIS — J3089 Other allergic rhinitis: Secondary | ICD-10-CM

## 2022-02-15 DIAGNOSIS — I10 Essential (primary) hypertension: Secondary | ICD-10-CM

## 2022-02-15 DIAGNOSIS — R922 Inconclusive mammogram: Secondary | ICD-10-CM | POA: Diagnosis not present

## 2022-02-15 DIAGNOSIS — R921 Mammographic calcification found on diagnostic imaging of breast: Secondary | ICD-10-CM

## 2022-02-15 DIAGNOSIS — H1010 Acute atopic conjunctivitis, unspecified eye: Secondary | ICD-10-CM

## 2022-02-15 DIAGNOSIS — J302 Other seasonal allergic rhinitis: Secondary | ICD-10-CM

## 2022-02-15 DIAGNOSIS — H1013 Acute atopic conjunctivitis, bilateral: Secondary | ICD-10-CM | POA: Diagnosis not present

## 2022-02-15 MED ORDER — AZELASTINE HCL 0.1 % NA SOLN: 1.0000 | Freq: Two times a day (BID) | NASAL | 5 refills | Status: DC | PRN

## 2022-02-15 MED ORDER — MONTELUKAST SODIUM 10 MG PO TABS: 10.0000 mg | ORAL_TABLET | Freq: Every day | ORAL | 5 refills | Status: DC

## 2022-02-15 MED ORDER — FLUTICASONE PROPIONATE 50 MCG/ACT NA SUSP: 2.0000 | Freq: Every day | NASAL | 5 refills | Status: DC | PRN

## 2022-02-15 NOTE — Patient Instructions (Addendum)
Environmental allergies 2022 skin testing showed: Positive to grass pollen, ragweed pollen, weed pollen, mold, cat, and borderline to dog.  Continue environmental control measures.  Continue allergy injections.   Take carbinoxamine 4mg  (1 to 1.5 tablet) twice a day as needed. May use azelastine nasal spray 1-2 sprays per nostril twice a day as needed for runny nose/drainage. May use Flonase (fluticasone) nasal spray 1 spray per nostril twice a day as needed for nasal congestion.  Nasal saline spray (i.e., Simply Saline) or nasal saline lavage (i.e., NeilMed) is recommended as needed and prior to medicated nasal sprays. Continue Singulair (montelukast) 10mg  daily at night.  Asthma: May use levoalbuterol rescue inhaler 2 puffs every 4 to 6 hours as needed for shortness of breath, chest tightness, coughing, and wheezing. Monitor frequency of use.   Heartburn: Continue lifestyle and dietary modifications. Continue lansoprazole 30mg  daily as prescribed.  Bee sting reaction: Avoid bee stings. For mild symptoms you can take over the counter antihistamines such as Benadryl and monitor symptoms closely. If symptoms worsen or if you have severe symptoms including breathing issues, throat closure, significant swelling, whole body hives, severe diarrhea and vomiting, lightheadedness then inject epinephrine and seek immediate medical care afterwards. Action plan in place.  Itching:  Not sure what's causing it. Continue to use the lidocaine topical as needed. Moisturize daily.  Follow up in 1 year or sooner if needed.

## 2022-02-15 NOTE — Assessment & Plan Note (Signed)
Past history - Diagnosed with asthma over 20 years ago and has weekly coughing, shortness of breath and chest tightness.  Does not like to use albuterol as it causes palpitations. 2022 spirometry was normal. Interim history - no rescue inhaler use.   May use levoalbuterol rescue inhaler 2 puffs every 4 to 6 hours as needed for shortness of breath, chest tightness, coughing, and wheezing.  Monitor frequency of use.

## 2022-02-15 NOTE — Assessment & Plan Note (Signed)
Controlled.   Continue lifestyle and dietary modifications.  Continue lansoprazole 30mg daily as prescribed. 

## 2022-02-15 NOTE — Assessment & Plan Note (Signed)
Past history - Throat tightness and trouble breathing in the past which required EpiPen use once.  2022 bloodwork negative. Interim history - No stings since last visit.   Avoid bee stings.  For mild symptoms you can take over the counter antihistamines such as Benadryl and monitor symptoms closely. If symptoms worsen or if you have severe symptoms including breathing issues, throat closure, significant swelling, whole body hives, severe diarrhea and vomiting, lightheadedness then inject epinephrine and seek immediate medical care afterwards.  Action plan in place.  If has another reaction - consider undergoing skin testing which is done in our [facility] office once a month as there are a small subset of people who react via skin testing but have negative bloodwork.

## 2022-02-15 NOTE — Assessment & Plan Note (Addendum)
Past history - Perennial rhinoconjunctivitis symptoms for many years but noticed worsening the past year. 2022 skin testing showed: Positive to grass pollen, ragweed pollen, weed pollen, mold, cat, and borderline to dog. Started AIT on 08/29/2021 (G-RW-T, M-C-D) Interim history - stable with below regimen.  Continue environmental control measures.   Continue allergy injections.   Take carbinoxamine 4mg  (1 to 1.5 tablet) twice a day as needed.  May use azelastine nasal spray 1-2 sprays per nostril twice a day as needed for runny nose/drainage.  May use Flonase (fluticasone) nasal spray 1 spray per nostril twice a day as needed for nasal congestion.   Nasal saline spray (i.e., Simply Saline) or nasal saline lavage (i.e., NeilMed) is recommended as needed and prior to medicated nasal sprays.  Continue Singulair (montelukast) 10mg  daily at night.

## 2022-02-15 NOTE — Assessment & Plan Note (Signed)
Pruritus only on upper extremities b/l since she had an accident. No associated rash. Not worse since started AIT. Had PT, EMG. On Gabapentin, topical lidocaine.  Discussed with patient that her symptoms do not seem to be allergy related regarding the itching.   Continue to use the lidocaine topical as needed.  Moisturize daily.

## 2022-02-19 ENCOUNTER — Other Ambulatory Visit: Payer: Self-pay | Admitting: Sports Medicine

## 2022-02-19 DIAGNOSIS — G4700 Insomnia, unspecified: Secondary | ICD-10-CM

## 2022-02-26 ENCOUNTER — Ambulatory Visit (INDEPENDENT_AMBULATORY_CARE_PROVIDER_SITE_OTHER): Payer: Medicare PPO

## 2022-02-26 DIAGNOSIS — J309 Allergic rhinitis, unspecified: Secondary | ICD-10-CM | POA: Diagnosis not present

## 2022-03-02 ENCOUNTER — Other Ambulatory Visit: Payer: Self-pay

## 2022-03-02 ENCOUNTER — Ambulatory Visit: Payer: Medicare PPO | Admitting: Sports Medicine

## 2022-03-02 ENCOUNTER — Ambulatory Visit (INDEPENDENT_AMBULATORY_CARE_PROVIDER_SITE_OTHER): Payer: Medicare PPO

## 2022-03-02 DIAGNOSIS — M160 Bilateral primary osteoarthritis of hip: Secondary | ICD-10-CM

## 2022-03-02 NOTE — Assessment & Plan Note (Signed)
Pleasant 71 year old female, known hip osteoarthritis, she last had an injection in November, worsening of pain, repeat right hip joint injection today. ?

## 2022-03-02 NOTE — Progress Notes (Signed)
? ? ?  Procedures performed today:   ? ?Procedure: Real-time Ultrasound Guided injection of the right hip joint ?Device: Samsung HS60  ?Verbal informed consent obtained.  ?Time-out conducted.  ?Noted no overlying erythema, induration, or other signs of local infection.  ?Skin prepped in a sterile fashion.  ?Local anesthesia: Topical Ethyl chloride.  ?With sterile technique and under real time ultrasound guidance: Noted arthritic joint, 1 cc Kenalog 40, 2 cc lidocaine, 2 cc bupivacaine injected easily ?Completed without difficulty  ?Advised to call if fevers/chills, erythema, induration, drainage, or persistent bleeding.  ?Images permanently stored and available for review in PACS.  ?Impression: Technically successful ultrasound guided injection. ? ?Independent interpretation of notes and tests performed by another provider:  ? ?None. ? ?Brief History, Exam, Impression, and Recommendations:   ? ?Primary osteoarthritis of both hips ?Pleasant 71 year old female, known hip osteoarthritis, she last had an injection in November, worsening of pain, repeat right hip joint injection today. ? ?Chronic process with exacerbation and pharmacologic intervention ? ?___________________________________________ ?Sara [name]. [name], M.D., ABFM., CAQSM. ?Primary Care and Sports Medicine ?[place] ? ?Adjunct Instructor of Family Medicine  ?[employer] ?

## 2022-03-05 ENCOUNTER — Ambulatory Visit (INDEPENDENT_AMBULATORY_CARE_PROVIDER_SITE_OTHER): Payer: Medicare PPO | Admitting: *Deleted

## 2022-03-05 DIAGNOSIS — J309 Allergic rhinitis, unspecified: Secondary | ICD-10-CM

## 2022-03-12 ENCOUNTER — Ambulatory Visit (INDEPENDENT_AMBULATORY_CARE_PROVIDER_SITE_OTHER): Payer: Medicare PPO

## 2022-03-12 DIAGNOSIS — J309 Allergic rhinitis, unspecified: Secondary | ICD-10-CM | POA: Diagnosis not present

## 2022-03-15 ENCOUNTER — Other Ambulatory Visit: Payer: Self-pay

## 2022-03-15 ENCOUNTER — Encounter: Payer: Self-pay | Admitting: Obstetrics and Gynecology

## 2022-03-15 ENCOUNTER — Ambulatory Visit: Payer: Medicare PPO | Admitting: Obstetrics and Gynecology

## 2022-03-15 VITALS — BP 123/85 | HR 70 | Resp 16 | Ht 65.0 in | Wt 194.0 lb

## 2022-03-15 DIAGNOSIS — Z0000 Encounter for general adult medical examination without abnormal findings: Secondary | ICD-10-CM

## 2022-03-15 NOTE — Progress Notes (Signed)
Subjective:  ?  ? Sara [name] is a 71 y.o. female P1 with BMI 32 postmenopausal who is here for a comprehensive physical exam. The patient reports no problems. She denies any episodes of postmenopausal vaginal bleeding. She denies any pelvic pain or abnormal discharge. She is not sexually active. Patient reports improvement of her urinary incontinence with better management of her allergy symptoms ? ?Past Medical History:  ?Diagnosis Date  ? Allergic rhinitis   ? Allergy 2000  ? Bee Venom, grass  ? Anxiety   ? Asthma   ? Cataract 2015  ? Corrected surgery  ? Complete rotator cuff tear or rupture of right shoulder, not specified as traumatic   ? Fibromyalgia   ? [name]  ? GERD (gastroesophageal reflux disease)   ? Hyperlipidemia   ? Hypertension   ? Insomnia 2000  ? Lumbar spondylosis   ? OSA (obstructive sleep apnea)   ? PONV (postoperative nausea and vomiting)   ? Postmenopausal bleeding   ? Rotator cuff impingement syndrome of left shoulder 07/20/2021  ? Vaginal Pap smear, abnormal   ? ?Past Surgical History:  ?Procedure Laterality Date  ? ADENOIDECTOMY    ? BREAST BIOPSY  1980  ? neg  ? DILATATION & CURETTAGE/HYSTEROSCOPY WITH MYOSURE N/A 02/17/2020  ? Procedure: DILATATION & CURETTAGE/HYSTEROSCOPY WITH MYOSURE POLYPECTOMY;  Surgeon: Emily Filbert, MD;  Location: [place];  Service: Gynecology;  Laterality: N/A;  rep will be here  ? EYE SURGERY  2015  ? JOINT REPLACEMENT  2000  ? REDUCTION MAMMAPLASTY Bilateral   ? SHOULDER ARTHROSCOPY WITH OPEN ROTATOR CUFF REPAIR AND DISTAL CLAVICLE ACROMINECTOMY Right 07/28/2021  ? Procedure: RIGHT SHOULDER ARTHROSCOPY WITH OPEN ROTATOR CUFF REPAIR, ACROMIOPLASTY AND DISTAL CLAVICLE ARTHROSCOPIC DEBRIDEMENT, LEFT SHOULDER INTRA-ARTICULAR INJECTION;  Surgeon: Earlie Server, MD;  Location: [place];  Service: Orthopedics;  Laterality: Right;  left shoulder injection  ? TONSILLECTOMY    ? TOTAL KNEE ARTHROPLASTY Left 2000, 2015  ?  Dr.  Tonita Cong  ? ?Family History  ?Problem Relation Age of Onset  ? Diabetes Mother   ? Heart attack Mother   ?     First MI at age 78  ? Diabetes type II Mother   ? Coronary artery disease Mother   ? Hypertension Father   ? Diabetes Father   ? Alcohol abuse Father   ? Pancreatitis Father   ? Anxiety disorder Father   ? Early death Father   ? Asthma Daughter   ? Asthma Grandchild   ? ? ? ?Social History  ? ?Socioeconomic History  ? Marital status: Married  ?  Spouse name: Sumiya Mamaril Sr.  ? Number of children: 1  ? Years of education: 16  ? Highest education level: Master's degree (e.g., MA, MS, MEng, MEd, MSW, MBA)  ?Occupational History  ? Occupation: Pharmacist, hospital  ?  Employer: [employer]  ?  Comment: Kindergarten at [employer]. /retired  ? Occupation: Retired.  ?Tobacco Use  ? Smoking status: Never  ? Smokeless tobacco: Never  ?Vaping Use  ? Vaping Use: Never used  ?Substance and Sexual Activity  ? Alcohol use: Not Currently  ?  Comment: Occasional-Last had a drink a month ago  ? Drug use: No  ? Sexual activity: Not Currently  ?  Partners: Male  ?  Birth control/protection: None, Post-menopausal  ?Other Topics Concern  ? Not on file  ?Social History Narrative  ? Lives with her husband. She has one child  and three step children. She enjoys reading, mediating, doing yoga and gardening.  ? ?Social Determinants of Health  ? ?Financial Resource Strain: Low Risk   ? Difficulty of Paying Living Expenses: Not hard at all  ?Food Insecurity: No Food Insecurity  ? Worried About Charity fundraiser in the Last Year: Never true  ? Ran Out of Food in the Last Year: Never true  ?Transportation Needs: No Transportation Needs  ? Lack of Transportation (Medical): No  ? Lack of Transportation (Non-Medical): No  ?Physical Activity: Inactive  ? Days of Exercise per Week: 0 days  ? Minutes of Exercise per Session: 0 min  ?Stress: No Stress Concern Present  ? Feeling of Stress : Not at all  ?Social Connections: Moderately  Isolated  ? Frequency of Communication with Friends and Family: More than three times a week  ? Frequency of Social Gatherings with Friends and Family: Once a week  ? Attends Religious Services: Never  ? Active Member of Clubs or Organizations: No  ? Attends Archivist Meetings: Never  ? Marital Status: Married  ?Intimate Partner Violence: Not At Risk  ? Fear of Current or Ex-Partner: No  ? Emotionally Abused: No  ? Physically Abused: No  ? Sexually Abused: No  ? ?Health Maintenance  ?Topic Date Due  ? COVID-19 Vaccine (6 - Booster for Butternut series) 11/14/2021  ? TETANUS/TDAP  03/04/2022  ? MAMMOGRAM  11/03/2023  ? COLONOSCOPY (Pts 45-60yr Insurance coverage will need to be confirmed)  04/19/2030  ? Pneumonia Vaccine 71 Years old  Completed  ? INFLUENZA VACCINE  Completed  ? DEXA SCAN  Completed  ? Hepatitis C Screening  Completed  ? Zoster Vaccines- Shingrix  Completed  ? HPV VACCINES  Aged Out  ? ? ?  ? ?Review of Systems ?Pertinent items noted in HPI and remainder of comprehensive ROS otherwise negative.  ? ?Objective:  ?Blood pressure 123/85, pulse 70, resp. rate 16, height '5\' 5"'$  (1.651 m), weight 194 lb (88 kg). ? ? GENERAL: Well-developed, well-nourished female in no acute distress.  ?HEENT: Normocephalic, atraumatic. Sclerae anicteric.  ?NECK: Supple. Normal thyroid.  ?LUNGS: Clear to auscultation bilaterally.  ?HEART: Regular rate and rhythm. ?BREASTS: Symmetric in size. No palpable masses or lymphadenopathy, skin changes, or nipple drainage. ?ABDOMEN: Soft, nontender, nondistended. No organomegaly. ?PELVIC: Normal external female genitalia. Vagina is pink and rugated.  Normal discharge. Normal appearing cervix. Uterus is normal in size. No adnexal mass or tenderness. Chaperone present during the pelvic exam ?EXTREMITIES: No cyanosis, clubbing, or edema, 2+ distal pulses. ?  ?  ?Assessment:  ? ? Healthy female exam.    ?  ?Plan:  ? ? Pap smear no longer indicated ?Patient had screening mammogram  01/2022 ?Patient is current on colonoscopy ?Follow up with PCP as scheduled ?See After Visit Summary for Counseling Recommendations  ? ?

## 2022-03-19 ENCOUNTER — Ambulatory Visit (INDEPENDENT_AMBULATORY_CARE_PROVIDER_SITE_OTHER): Payer: Medicare PPO

## 2022-03-19 DIAGNOSIS — J309 Allergic rhinitis, unspecified: Secondary | ICD-10-CM | POA: Diagnosis not present

## 2022-03-20 ENCOUNTER — Other Ambulatory Visit: Payer: Self-pay

## 2022-03-20 MED ORDER — TIZANIDINE HCL 4 MG PO TABS: ORAL_TABLET | ORAL | 3 refills | Status: AC

## 2022-03-26 ENCOUNTER — Ambulatory Visit (INDEPENDENT_AMBULATORY_CARE_PROVIDER_SITE_OTHER): Payer: Medicare PPO

## 2022-03-26 DIAGNOSIS — J309 Allergic rhinitis, unspecified: Secondary | ICD-10-CM | POA: Diagnosis not present

## 2022-04-02 ENCOUNTER — Ambulatory Visit (INDEPENDENT_AMBULATORY_CARE_PROVIDER_SITE_OTHER): Payer: Medicare PPO

## 2022-04-02 DIAGNOSIS — J309 Allergic rhinitis, unspecified: Secondary | ICD-10-CM

## 2022-04-03 DIAGNOSIS — J302 Other seasonal allergic rhinitis: Secondary | ICD-10-CM | POA: Diagnosis not present

## 2022-04-03 NOTE — Progress Notes (Signed)
VIALS EXP 04-04-23 ?

## 2022-04-04 DIAGNOSIS — J3081 Allergic rhinitis due to animal (cat) (dog) hair and dander: Secondary | ICD-10-CM | POA: Diagnosis not present

## 2022-04-06 ENCOUNTER — Encounter: Payer: Self-pay | Admitting: Sports Medicine

## 2022-04-10 ENCOUNTER — Ambulatory Visit (INDEPENDENT_AMBULATORY_CARE_PROVIDER_SITE_OTHER): Payer: Medicare PPO

## 2022-04-10 DIAGNOSIS — J309 Allergic rhinitis, unspecified: Secondary | ICD-10-CM | POA: Diagnosis not present

## 2022-04-16 ENCOUNTER — Ambulatory Visit (INDEPENDENT_AMBULATORY_CARE_PROVIDER_SITE_OTHER): Payer: Medicare PPO

## 2022-04-16 DIAGNOSIS — J309 Allergic rhinitis, unspecified: Secondary | ICD-10-CM

## 2022-04-18 ENCOUNTER — Encounter: Payer: Self-pay | Admitting: Sports Medicine

## 2022-04-18 ENCOUNTER — Ambulatory Visit: Payer: Medicare PPO | Admitting: Sports Medicine

## 2022-04-18 DIAGNOSIS — M75121 Complete rotator cuff tear or rupture of right shoulder, not specified as traumatic: Secondary | ICD-10-CM

## 2022-04-18 DIAGNOSIS — M160 Bilateral primary osteoarthritis of hip: Secondary | ICD-10-CM

## 2022-04-18 DIAGNOSIS — S46011S Strain of muscle(s) and tendon(s) of the rotator cuff of right shoulder, sequela: Secondary | ICD-10-CM

## 2022-04-18 MED ORDER — TRAMADOL HCL 50 MG PO TABS: 50.0000 mg | ORAL_TABLET | Freq: Three times a day (TID) | ORAL | 0 refills | Status: DC | PRN

## 2022-04-18 NOTE — Assessment & Plan Note (Signed)
71 year old female, she is post massive rotator cuff repair, she was historically doing well, now having some increasing pain right shoulder. ?She tells me she did discuss this with her surgeon, who has suggested to continue with conservative treatment. ?She is currently caring for her husband, she understands that we may need additional medical measures through her pain management provider but in the meantime I am happy to do some tramadol, she does admit to not being consistent with her tramadol dosing, so we will try this first. ?If insufficient improvement with high-dose tramadol then she will go back to her pain management provider for discussion of a Butrans patch. ?As per typical we can only start with a short supply of the tramadol. ?

## 2022-04-18 NOTE — Assessment & Plan Note (Signed)
Recurrence of pain right groin in spite of hip and injection early March of this year. ?She does need a hip arthroplasty but cannot right now, currently caring for her husband. ?We will do tramadol in the meantime. ?

## 2022-04-18 NOTE — Progress Notes (Signed)
? ? ?  Procedures performed today:   ? ?None. ? ?Independent interpretation of notes and tests performed by another provider:  ? ?None. ? ?Brief History, Exam, Impression, and Recommendations:   ? ?Complete rotator cuff tear or rupture of right shoulder, not specified as traumatic ?71 year old female, she is post massive rotator cuff repair, she was historically doing well, now having some increasing pain right shoulder. ?She tells me she did discuss this with her surgeon, who has suggested to continue with conservative treatment. ?She is currently caring for her husband, she understands that we may need additional medical measures through her pain management provider but in the meantime I am happy to do some tramadol, she does admit to not being consistent with her tramadol dosing, so we will try this first. ?If insufficient improvement with high-dose tramadol then she will go back to her pain management provider for discussion of a Butrans patch. ?As per typical we can only start with a short supply of the tramadol. ? ?Primary osteoarthritis of both hips ?Recurrence of pain right groin in spite of hip and injection early March of this year. ?She does need a hip arthroplasty but cannot right now, currently caring for her husband. ?We will do tramadol in the meantime. ? ?Chronic process with exacerbation and pharmacologic intervention ? ?___________________________________________ ?Sara Her. [name], M.D., ABFM., CAQSM. ?Primary Care and Sports Medicine ?[place] ? ?Adjunct Instructor of Family Medicine  ?[employer] ?

## 2022-04-19 ENCOUNTER — Other Ambulatory Visit: Payer: Self-pay | Admitting: Sports Medicine

## 2022-04-19 DIAGNOSIS — M47812 Spondylosis without myelopathy or radiculopathy, cervical region: Secondary | ICD-10-CM

## 2022-04-23 ENCOUNTER — Ambulatory Visit (INDEPENDENT_AMBULATORY_CARE_PROVIDER_SITE_OTHER): Payer: Medicare PPO

## 2022-04-23 DIAGNOSIS — J309 Allergic rhinitis, unspecified: Secondary | ICD-10-CM

## 2022-04-24 DIAGNOSIS — D225 Melanocytic nevi of trunk: Secondary | ICD-10-CM | POA: Diagnosis not present

## 2022-04-24 DIAGNOSIS — L821 Other seborrheic keratosis: Secondary | ICD-10-CM | POA: Diagnosis not present

## 2022-04-24 DIAGNOSIS — L814 Other melanin hyperpigmentation: Secondary | ICD-10-CM | POA: Diagnosis not present

## 2022-04-24 DIAGNOSIS — L579 Skin changes due to chronic exposure to nonionizing radiation, unspecified: Secondary | ICD-10-CM | POA: Diagnosis not present

## 2022-04-24 DIAGNOSIS — L209 Atopic dermatitis, unspecified: Secondary | ICD-10-CM | POA: Diagnosis not present

## 2022-04-24 DIAGNOSIS — D692 Other nonthrombocytopenic purpura: Secondary | ICD-10-CM | POA: Diagnosis not present

## 2022-04-30 ENCOUNTER — Ambulatory Visit (INDEPENDENT_AMBULATORY_CARE_PROVIDER_SITE_OTHER): Payer: Medicare PPO

## 2022-04-30 DIAGNOSIS — J309 Allergic rhinitis, unspecified: Secondary | ICD-10-CM | POA: Diagnosis not present

## 2022-05-02 ENCOUNTER — Ambulatory Visit
Admission: RE | Admit: 2022-05-02 | Discharge: 2022-05-02 | Disposition: A | Discharge status: Home / Self Care | Payer: Medicare PPO | Source: Ambulatory Visit | Attending: Sports Medicine | Admitting: Sports Medicine

## 2022-05-02 DIAGNOSIS — R921 Mammographic calcification found on diagnostic imaging of breast: Secondary | ICD-10-CM

## 2022-05-09 ENCOUNTER — Ambulatory Visit (INDEPENDENT_AMBULATORY_CARE_PROVIDER_SITE_OTHER): Payer: Medicare PPO

## 2022-05-09 ENCOUNTER — Encounter: Payer: Self-pay | Admitting: Sports Medicine

## 2022-05-09 DIAGNOSIS — J309 Allergic rhinitis, unspecified: Secondary | ICD-10-CM | POA: Diagnosis not present

## 2022-05-09 DIAGNOSIS — S46011S Strain of muscle(s) and tendon(s) of the rotator cuff of right shoulder, sequela: Secondary | ICD-10-CM

## 2022-05-09 MED ORDER — TRAMADOL HCL 50 MG PO TABS: 50.0000 mg | ORAL_TABLET | Freq: Three times a day (TID) | ORAL | 0 refills | Status: DC | PRN

## 2022-05-15 ENCOUNTER — Ambulatory Visit (INDEPENDENT_AMBULATORY_CARE_PROVIDER_SITE_OTHER): Payer: Medicare PPO

## 2022-05-15 DIAGNOSIS — J309 Allergic rhinitis, unspecified: Secondary | ICD-10-CM | POA: Diagnosis not present

## 2022-05-16 ENCOUNTER — Other Ambulatory Visit: Payer: Self-pay

## 2022-05-16 DIAGNOSIS — G4700 Insomnia, unspecified: Secondary | ICD-10-CM

## 2022-05-16 MED ORDER — ZOLPIDEM TARTRATE 10 MG PO TABS: ORAL_TABLET | ORAL | 0 refills | Status: AC

## 2022-05-24 ENCOUNTER — Other Ambulatory Visit: Payer: Self-pay | Admitting: Sports Medicine

## 2022-05-29 ENCOUNTER — Ambulatory Visit (INDEPENDENT_AMBULATORY_CARE_PROVIDER_SITE_OTHER): Payer: Medicare PPO

## 2022-05-29 ENCOUNTER — Encounter: Payer: Self-pay | Admitting: Sports Medicine

## 2022-05-29 DIAGNOSIS — J309 Allergic rhinitis, unspecified: Secondary | ICD-10-CM | POA: Diagnosis not present

## 2022-05-31 ENCOUNTER — Ambulatory Visit (INDEPENDENT_AMBULATORY_CARE_PROVIDER_SITE_OTHER): Payer: Medicare PPO

## 2022-05-31 ENCOUNTER — Encounter: Payer: Self-pay | Admitting: Podiatry

## 2022-05-31 ENCOUNTER — Ambulatory Visit: Payer: Medicare PPO | Admitting: Podiatry

## 2022-05-31 DIAGNOSIS — M722 Plantar fascial fibromatosis: Secondary | ICD-10-CM

## 2022-05-31 DIAGNOSIS — M79672 Pain in left foot: Secondary | ICD-10-CM | POA: Diagnosis not present

## 2022-05-31 MED ORDER — DEXAMETHASONE SODIUM PHOSPHATE 120 MG/30ML IJ SOLN
4.0000 mg | Freq: Once | INTRAMUSCULAR | Status: AC
  Administered 2022-05-31: 4 mg via INTRA_ARTICULAR

## 2022-05-31 NOTE — Progress Notes (Signed)
  Subjective:  Patient ID: Sara [name], female    DOB: 1951/10/22,   MRN: [id number]  Chief Complaint  Patient presents with   Foot Pain     Possible plantar fascitiis- left heel    71 y.o. female presents for concern of left heel pain that has been going on for 2 months. Relates she gets a stabbing pain in her heel when walking. Relates she has been stretching which helps as well as icing and has tried new shoes. She is also taking tramadol for her shoulder pain currently. She has been in the care of pain management previously . Denies any other pedal complaints. Denies n/v/f/c.   Past Medical History:  Diagnosis Date   Allergic rhinitis    Allergy 2000   Bee Venom, grass   Anxiety    Asthma    Cataract 2015   Corrected surgery   Complete rotator cuff tear or rupture of right shoulder, not specified as traumatic    Fibromyalgia    [name]   GERD (gastroesophageal reflux disease)    Hyperlipidemia    Hypertension    Insomnia 2000   Lumbar spondylosis    OSA (obstructive sleep apnea)    PONV (postoperative nausea and vomiting)    Postmenopausal bleeding    Rotator cuff impingement syndrome of left shoulder 07/20/2021   Vaginal Pap smear, abnormal     Objective:  Physical Exam: Vascular: DP/PT pulses 2/4 bilateral. CFT <3 seconds. Normal hair growth on digits. No edema.  Skin. No lacerations or abrasions bilateral feet.  Musculoskeletal: MMT 5/5 bilateral lower extremities in DF, PF, Inversion and Eversion. Deceased ROM in DF of ankle joint. Tender to medial calcaneal tubercle on the left. No pain along arch, PT tendon or achilles. No pain with calcaneal squeeze. No pain with DF/PF.  Neurological: Sensation intact to light touch.   Assessment:   1. Plantar fasciitis, left      Plan:  Patient was evaluated and treated and all questions answered. Discussed plantar fasciitis with patient.  X-rays reviewed and discussed with patient. No acute fractures or  dislocations noted. Mild spurring noted at inferior calcaneus and posterior  Discussed treatment options including, ice, NSAIDS, supportive shoes, bracing, and stretching. Stretching exercises provided to be done on a daily basis.   Will hold off on any anti-inflammatories. Currently taking tramadol. Patient requesting injection today. Procedure note below.   Follow-up 6 weeks or sooner if any problems arise. In the meantime, encouraged to call the office with any questions, concerns, change in symptoms.   Procedure:  Discussed etiology, pathology, conservative vs. surgical therapies. At this time a plantar fascial injection was recommended.  The patient agreed and a sterile skin prep was applied.  An injection consisting of  dexamethasone and marcaine mixture was infiltrated at the point of maximal tenderness on the left Heel.  Bandaid applied. The patient tolerated this well and was given instructions for aftercare.     Lorenda Peck, DPM

## 2022-05-31 NOTE — Patient Instructions (Signed)

## 2022-06-05 ENCOUNTER — Telehealth: Payer: Self-pay

## 2022-06-05 ENCOUNTER — Ambulatory Visit
Admission: RE | Admit: 2022-06-05 | Discharge: 2022-06-05 | Disposition: A | Discharge status: Home / Self Care | Payer: Medicare PPO | Source: Ambulatory Visit | Attending: Sports Medicine | Admitting: Sports Medicine

## 2022-06-05 ENCOUNTER — Ambulatory Visit (INDEPENDENT_AMBULATORY_CARE_PROVIDER_SITE_OTHER): Payer: Medicare PPO

## 2022-06-05 ENCOUNTER — Telehealth: Payer: Self-pay | Admitting: *Deleted

## 2022-06-05 DIAGNOSIS — G8929 Other chronic pain: Secondary | ICD-10-CM

## 2022-06-05 DIAGNOSIS — J309 Allergic rhinitis, unspecified: Secondary | ICD-10-CM

## 2022-06-05 DIAGNOSIS — R52 Pain, unspecified: Secondary | ICD-10-CM

## 2022-06-05 DIAGNOSIS — R921 Mammographic calcification found on diagnostic imaging of breast: Secondary | ICD-10-CM | POA: Diagnosis not present

## 2022-06-05 MED ORDER — MELOXICAM 15 MG PO TABS: 15.0000 mg | ORAL_TABLET | Freq: Every day | ORAL | 2 refills | Status: DC

## 2022-06-05 NOTE — Telephone Encounter (Signed)
Refill meloxicam, she should have some tramadol from last month.

## 2022-06-05 NOTE — Telephone Encounter (Signed)
Pt informed of meloxicam refill.  Pt states that she does have some tramadol left from last month.  Charyl Bigger, CMA

## 2022-06-05 NOTE — Telephone Encounter (Signed)
Patient is calling she is still in pain after injection given on Friday. Returned the call back to patient to get more information, if she is taking the tylenol, doing stretching, etc. No answer, left message for call back.Please advise.

## 2022-06-05 NOTE — Telephone Encounter (Signed)
Patient is no longer taking the mobic and would like another prescription for it if possible. She was given instructions and recommendations per physician.Verbalized understanding.

## 2022-06-05 NOTE — Telephone Encounter (Signed)
Pt informed.  Pt given appt for 06/22/2022 at 10:45am.  Pt is requesting RX for an anti inflammatory to help with the pain until her appt.  Please advise.  Charyl Bigger, CMA

## 2022-06-05 NOTE — Telephone Encounter (Signed)
Patient called stating she is having pain in her right hip and wants to either gt and injection or change medication because it is not helping.

## 2022-06-05 NOTE — Telephone Encounter (Signed)
Its been long enough so we can do another injection, but we also discussed that she needs a hip replacement at this point.  I guess lets get her scheduled

## 2022-06-06 ENCOUNTER — Other Ambulatory Visit: Payer: Self-pay | Admitting: Podiatry

## 2022-06-06 DIAGNOSIS — G8929 Other chronic pain: Secondary | ICD-10-CM

## 2022-06-06 DIAGNOSIS — R52 Pain, unspecified: Secondary | ICD-10-CM

## 2022-06-06 MED ORDER — MELOXICAM 15 MG PO TABS: 15.0000 mg | ORAL_TABLET | Freq: Every day | ORAL | 2 refills | Status: AC

## 2022-06-06 NOTE — Telephone Encounter (Signed)
Called and notified patient.

## 2022-06-13 ENCOUNTER — Ambulatory Visit (INDEPENDENT_AMBULATORY_CARE_PROVIDER_SITE_OTHER): Payer: Medicare PPO

## 2022-06-13 DIAGNOSIS — J309 Allergic rhinitis, unspecified: Secondary | ICD-10-CM | POA: Diagnosis not present

## 2022-06-19 ENCOUNTER — Other Ambulatory Visit: Payer: Self-pay | Admitting: Sports Medicine

## 2022-06-19 DIAGNOSIS — S46011S Strain of muscle(s) and tendon(s) of the rotator cuff of right shoulder, sequela: Secondary | ICD-10-CM

## 2022-06-22 ENCOUNTER — Encounter: Payer: Self-pay | Admitting: Sports Medicine

## 2022-06-22 ENCOUNTER — Ambulatory Visit: Payer: Medicare PPO | Admitting: Sports Medicine

## 2022-06-22 ENCOUNTER — Ambulatory Visit (INDEPENDENT_AMBULATORY_CARE_PROVIDER_SITE_OTHER): Payer: Medicare PPO

## 2022-06-22 DIAGNOSIS — M160 Bilateral primary osteoarthritis of hip: Secondary | ICD-10-CM

## 2022-06-22 DIAGNOSIS — G894 Chronic pain syndrome: Secondary | ICD-10-CM | POA: Insufficient documentation

## 2022-06-22 NOTE — Progress Notes (Signed)
    Procedures performed today:    Procedure: Real-time Ultrasound Guided injection of the right hip joint Device: Samsung HS60  Verbal informed consent obtained.  Time-out conducted.  Noted no overlying erythema, induration, or other signs of local infection.  Skin prepped in a sterile fashion.  Local anesthesia: Topical Ethyl chloride.  With sterile technique and under real time ultrasound guidance: Noted minimally arthritic joint, 1 cc Kenalog  40, 2 cc lidocaine , 2 cc bupivacaine  injected easily Completed without difficulty  Advised to call if fevers/chills, erythema, induration, drainage, or persistent bleeding.  Images permanently stored and available for review in PACS.  Impression: Technically successful ultrasound guided injection.  Independent interpretation of notes and tests performed by another provider:   None.  Brief History, Exam, Impression, and Recommendations:    Primary osteoarthritis of both hips Known hip arthritis, last injection in March. Continue tramadol , I injected her right hip joint again today. Significant dystrophic changes on ultrasound, we will get her in with orthopedic surgery, she understands we cannot do hip flexion for 3 months after instrumentation.    ___________________________________________ Debby PARAS. Curtis, M.D., ABFM., CAQSM. Primary Care and Sports Medicine [facility]  Adjunct Instructor of Family Medicine  [employer]

## 2022-06-22 NOTE — Assessment & Plan Note (Addendum)
Known hip arthritis, last injection in March. Continue tramadol, I injected her right hip joint again today. Significant dystrophic changes on ultrasound, we will get her in with orthopedic surgery, she understands we cannot do hip flexion for 3 months after instrumentation.

## 2022-06-25 DIAGNOSIS — R1012 Left upper quadrant pain: Secondary | ICD-10-CM | POA: Diagnosis not present

## 2022-06-25 DIAGNOSIS — K859 Acute pancreatitis without necrosis or infection, unspecified: Secondary | ICD-10-CM | POA: Diagnosis not present

## 2022-06-25 DIAGNOSIS — K219 Gastro-esophageal reflux disease without esophagitis: Secondary | ICD-10-CM | POA: Diagnosis not present

## 2022-06-25 DIAGNOSIS — K7689 Other specified diseases of liver: Secondary | ICD-10-CM | POA: Diagnosis not present

## 2022-06-25 DIAGNOSIS — K5900 Constipation, unspecified: Secondary | ICD-10-CM | POA: Diagnosis not present

## 2022-06-26 ENCOUNTER — Other Ambulatory Visit: Payer: Self-pay | Admitting: Sports Medicine

## 2022-06-26 DIAGNOSIS — I10 Essential (primary) hypertension: Secondary | ICD-10-CM

## 2022-07-04 ENCOUNTER — Ambulatory Visit (INDEPENDENT_AMBULATORY_CARE_PROVIDER_SITE_OTHER): Payer: Medicare PPO

## 2022-07-04 DIAGNOSIS — J309 Allergic rhinitis, unspecified: Secondary | ICD-10-CM

## 2022-07-05 DIAGNOSIS — Z7689 Persons encountering health services in other specified circumstances: Secondary | ICD-10-CM | POA: Diagnosis not present

## 2022-07-05 DIAGNOSIS — Z131 Encounter for screening for diabetes mellitus: Secondary | ICD-10-CM | POA: Diagnosis not present

## 2022-07-05 DIAGNOSIS — M810 Age-related osteoporosis without current pathological fracture: Secondary | ICD-10-CM | POA: Diagnosis not present

## 2022-07-05 DIAGNOSIS — M25511 Pain in right shoulder: Secondary | ICD-10-CM | POA: Diagnosis not present

## 2022-07-05 DIAGNOSIS — K219 Gastro-esophageal reflux disease without esophagitis: Secondary | ICD-10-CM | POA: Diagnosis not present

## 2022-07-05 DIAGNOSIS — E785 Hyperlipidemia, unspecified: Secondary | ICD-10-CM | POA: Diagnosis not present

## 2022-07-05 DIAGNOSIS — G8929 Other chronic pain: Secondary | ICD-10-CM | POA: Diagnosis not present

## 2022-07-05 DIAGNOSIS — I10 Essential (primary) hypertension: Secondary | ICD-10-CM | POA: Diagnosis not present

## 2022-07-09 ENCOUNTER — Ambulatory Visit (INDEPENDENT_AMBULATORY_CARE_PROVIDER_SITE_OTHER): Payer: Medicare PPO | Admitting: *Deleted

## 2022-07-09 DIAGNOSIS — J309 Allergic rhinitis, unspecified: Secondary | ICD-10-CM | POA: Diagnosis not present

## 2022-07-10 DIAGNOSIS — K449 Diaphragmatic hernia without obstruction or gangrene: Secondary | ICD-10-CM | POA: Diagnosis not present

## 2022-07-10 DIAGNOSIS — R1012 Left upper quadrant pain: Secondary | ICD-10-CM | POA: Diagnosis not present

## 2022-07-10 DIAGNOSIS — K859 Acute pancreatitis without necrosis or infection, unspecified: Secondary | ICD-10-CM | POA: Diagnosis not present

## 2022-07-10 DIAGNOSIS — K5790 Diverticulosis of intestine, part unspecified, without perforation or abscess without bleeding: Secondary | ICD-10-CM | POA: Diagnosis not present

## 2022-07-10 DIAGNOSIS — K7689 Other specified diseases of liver: Secondary | ICD-10-CM | POA: Diagnosis not present

## 2022-07-11 DIAGNOSIS — M47812 Spondylosis without myelopathy or radiculopathy, cervical region: Secondary | ICD-10-CM | POA: Diagnosis not present

## 2022-07-11 DIAGNOSIS — G8929 Other chronic pain: Secondary | ICD-10-CM | POA: Diagnosis not present

## 2022-07-11 DIAGNOSIS — F419 Anxiety disorder, unspecified: Secondary | ICD-10-CM | POA: Diagnosis not present

## 2022-07-11 DIAGNOSIS — Z79891 Long term (current) use of opiate analgesic: Secondary | ICD-10-CM | POA: Diagnosis not present

## 2022-07-11 DIAGNOSIS — M160 Bilateral primary osteoarthritis of hip: Secondary | ICD-10-CM | POA: Diagnosis not present

## 2022-07-11 DIAGNOSIS — M545 Low back pain, unspecified: Secondary | ICD-10-CM | POA: Diagnosis not present

## 2022-07-11 DIAGNOSIS — M25511 Pain in right shoulder: Secondary | ICD-10-CM | POA: Diagnosis not present

## 2022-07-11 DIAGNOSIS — F32A Depression, unspecified: Secondary | ICD-10-CM | POA: Diagnosis not present

## 2022-07-11 DIAGNOSIS — M797 Fibromyalgia: Secondary | ICD-10-CM | POA: Diagnosis not present

## 2022-07-12 ENCOUNTER — Ambulatory Visit: Payer: Medicare PPO | Admitting: Podiatry

## 2022-07-12 ENCOUNTER — Encounter: Payer: Self-pay | Admitting: Podiatry

## 2022-07-12 DIAGNOSIS — M722 Plantar fascial fibromatosis: Secondary | ICD-10-CM | POA: Diagnosis not present

## 2022-07-12 MED ORDER — DEXAMETHASONE SODIUM PHOSPHATE 120 MG/30ML IJ SOLN
4.0000 mg | Freq: Once | INTRAMUSCULAR | Status: AC
  Administered 2022-07-12: 4 mg via INTRA_ARTICULAR

## 2022-07-12 NOTE — Progress Notes (Signed)
  Subjective:  Patient ID: Sara [name], female    DOB: 01/14/1951,   MRN: [id number]  Chief Complaint  Patient presents with   Plantar Fasciitis    Left heel PF f/u    71 y.o. female presents for follow-up of left plantar fasciitis.  States the heel pain is doing much better since last visit. Relates it does vary. Injection did help from last visit.  She is also taking tramadol for her shoulder pain currently. She has been in the care of pain management previously . Denies any other pedal complaints. Denies n/v/f/c.   Past Medical History:  Diagnosis Date   Allergic rhinitis    Allergy 2000   Bee Venom, grass   Anxiety    Asthma    Cataract 2015   Corrected surgery   Complete rotator cuff tear or rupture of right shoulder, not specified as traumatic    Fibromyalgia    [name]   GERD (gastroesophageal reflux disease)    Hyperlipidemia    Hypertension    Insomnia 2000   Lumbar spondylosis    OSA (obstructive sleep apnea)    PONV (postoperative nausea and vomiting)    Postmenopausal bleeding    Rotator cuff impingement syndrome of left shoulder 07/20/2021   Vaginal Pap smear, abnormal     Objective:  Physical Exam: Vascular: DP/PT pulses 2/4 bilateral. CFT <3 seconds. Normal hair growth on digits. No edema.  Skin. No lacerations or abrasions bilateral feet.  Musculoskeletal: MMT 5/5 bilateral lower extremities in DF, PF, Inversion and Eversion. Deceased ROM in DF of ankle joint. Tender to medial calcaneal tubercle on the left. No pain along arch, PT tendon or achilles. No pain with calcaneal squeeze. No pain with DF/PF.  Neurological: Sensation intact to light touch.   Assessment:   1. Plantar fasciitis, left       Plan:  Patient was evaluated and treated and all questions answered. Discussed plantar fasciitis with patient.  X-rays reviewed and discussed with patient. No acute fractures or dislocations noted. Mild spurring noted at inferior calcaneus and  posterior  Discussed treatment options including, ice, NSAIDS, supportive shoes, bracing, and stretching.  Continue stretching and supportive shoes.  Will hold off on any anti-inflammatories. Currently taking tramadol. Patient requesting injection today. Procedure note below.   Follow-up as needed.   Procedure:  Discussed etiology, pathology, conservative vs. surgical therapies. At this time a plantar fascial injection was recommended.  The patient agreed and a sterile skin prep was applied.  An injection consisting of  dexamethasone and marcaine mixture was infiltrated at the point of maximal tenderness on the left Heel.  Bandaid applied. The patient tolerated this well and was given instructions for aftercare.     Lorenda Peck, DPM

## 2022-07-13 ENCOUNTER — Other Ambulatory Visit: Payer: Self-pay | Admitting: Allergy

## 2022-07-17 DIAGNOSIS — M8589 Other specified disorders of bone density and structure, multiple sites: Secondary | ICD-10-CM | POA: Diagnosis not present

## 2022-07-17 DIAGNOSIS — M85852 Other specified disorders of bone density and structure, left thigh: Secondary | ICD-10-CM | POA: Diagnosis not present

## 2022-07-18 ENCOUNTER — Ambulatory Visit (INDEPENDENT_AMBULATORY_CARE_PROVIDER_SITE_OTHER): Payer: Medicare PPO | Admitting: *Deleted

## 2022-07-18 DIAGNOSIS — J309 Allergic rhinitis, unspecified: Secondary | ICD-10-CM | POA: Diagnosis not present

## 2022-07-23 ENCOUNTER — Ambulatory Visit (INDEPENDENT_AMBULATORY_CARE_PROVIDER_SITE_OTHER): Payer: Medicare PPO

## 2022-07-23 DIAGNOSIS — J309 Allergic rhinitis, unspecified: Secondary | ICD-10-CM

## 2022-07-23 DIAGNOSIS — S4991XD Unspecified injury of right shoulder and upper arm, subsequent encounter: Secondary | ICD-10-CM | POA: Diagnosis not present

## 2022-07-23 DIAGNOSIS — I10 Essential (primary) hypertension: Secondary | ICD-10-CM | POA: Diagnosis not present

## 2022-07-23 DIAGNOSIS — E785 Hyperlipidemia, unspecified: Secondary | ICD-10-CM | POA: Diagnosis not present

## 2022-07-30 ENCOUNTER — Ambulatory Visit (INDEPENDENT_AMBULATORY_CARE_PROVIDER_SITE_OTHER): Payer: Medicare PPO

## 2022-07-30 DIAGNOSIS — J309 Allergic rhinitis, unspecified: Secondary | ICD-10-CM

## 2022-08-07 ENCOUNTER — Other Ambulatory Visit: Payer: Self-pay | Admitting: Sports Medicine

## 2022-08-07 ENCOUNTER — Ambulatory Visit (INDEPENDENT_AMBULATORY_CARE_PROVIDER_SITE_OTHER): Payer: Medicare PPO | Admitting: *Deleted

## 2022-08-07 DIAGNOSIS — J309 Allergic rhinitis, unspecified: Secondary | ICD-10-CM

## 2022-08-07 DIAGNOSIS — S46011S Strain of muscle(s) and tendon(s) of the rotator cuff of right shoulder, sequela: Secondary | ICD-10-CM

## 2022-08-09 ENCOUNTER — Other Ambulatory Visit: Payer: Self-pay | Admitting: Family

## 2022-08-09 ENCOUNTER — Other Ambulatory Visit: Payer: Self-pay | Admitting: Sports Medicine

## 2022-08-09 DIAGNOSIS — J302 Other seasonal allergic rhinitis: Secondary | ICD-10-CM | POA: Diagnosis not present

## 2022-08-09 DIAGNOSIS — I10 Essential (primary) hypertension: Secondary | ICD-10-CM

## 2022-08-09 NOTE — Progress Notes (Signed)
VIALS EXP 08-10-23

## 2022-08-10 DIAGNOSIS — J3081 Allergic rhinitis due to animal (cat) (dog) hair and dander: Secondary | ICD-10-CM | POA: Diagnosis not present

## 2022-08-13 ENCOUNTER — Ambulatory Visit (INDEPENDENT_AMBULATORY_CARE_PROVIDER_SITE_OTHER): Payer: Medicare PPO

## 2022-08-13 DIAGNOSIS — J309 Allergic rhinitis, unspecified: Secondary | ICD-10-CM | POA: Diagnosis not present

## 2022-08-20 ENCOUNTER — Ambulatory Visit (INDEPENDENT_AMBULATORY_CARE_PROVIDER_SITE_OTHER): Payer: Medicare PPO | Admitting: *Deleted

## 2022-08-20 DIAGNOSIS — J309 Allergic rhinitis, unspecified: Secondary | ICD-10-CM | POA: Diagnosis not present

## 2022-08-28 DIAGNOSIS — M25511 Pain in right shoulder: Secondary | ICD-10-CM | POA: Diagnosis not present

## 2022-08-28 DIAGNOSIS — M778 Other enthesopathies, not elsewhere classified: Secondary | ICD-10-CM | POA: Diagnosis not present

## 2022-08-28 DIAGNOSIS — M7541 Impingement syndrome of right shoulder: Secondary | ICD-10-CM | POA: Diagnosis not present

## 2022-08-28 DIAGNOSIS — M19011 Primary osteoarthritis, right shoulder: Secondary | ICD-10-CM | POA: Diagnosis not present

## 2022-08-30 ENCOUNTER — Ambulatory Visit (INDEPENDENT_AMBULATORY_CARE_PROVIDER_SITE_OTHER): Payer: Medicare PPO

## 2022-08-30 DIAGNOSIS — J309 Allergic rhinitis, unspecified: Secondary | ICD-10-CM | POA: Diagnosis not present

## 2022-08-31 DIAGNOSIS — M25551 Pain in right hip: Secondary | ICD-10-CM | POA: Diagnosis not present

## 2022-08-31 DIAGNOSIS — M1611 Unilateral primary osteoarthritis, right hip: Secondary | ICD-10-CM | POA: Diagnosis not present

## 2022-09-04 ENCOUNTER — Ambulatory Visit (INDEPENDENT_AMBULATORY_CARE_PROVIDER_SITE_OTHER): Payer: Medicare PPO

## 2022-09-04 DIAGNOSIS — J309 Allergic rhinitis, unspecified: Secondary | ICD-10-CM | POA: Diagnosis not present

## 2022-09-13 ENCOUNTER — Ambulatory Visit (INDEPENDENT_AMBULATORY_CARE_PROVIDER_SITE_OTHER): Payer: Medicare PPO | Admitting: *Deleted

## 2022-09-13 DIAGNOSIS — J309 Allergic rhinitis, unspecified: Secondary | ICD-10-CM

## 2022-09-19 ENCOUNTER — Ambulatory Visit (INDEPENDENT_AMBULATORY_CARE_PROVIDER_SITE_OTHER): Payer: Medicare PPO | Admitting: *Deleted

## 2022-09-19 DIAGNOSIS — J309 Allergic rhinitis, unspecified: Secondary | ICD-10-CM

## 2022-09-22 ENCOUNTER — Other Ambulatory Visit: Payer: Self-pay | Admitting: Sports Medicine

## 2022-09-22 DIAGNOSIS — I10 Essential (primary) hypertension: Secondary | ICD-10-CM

## 2022-09-24 DIAGNOSIS — M7541 Impingement syndrome of right shoulder: Secondary | ICD-10-CM | POA: Diagnosis not present

## 2022-09-24 DIAGNOSIS — M25511 Pain in right shoulder: Secondary | ICD-10-CM | POA: Diagnosis not present

## 2022-09-24 DIAGNOSIS — G8929 Other chronic pain: Secondary | ICD-10-CM | POA: Diagnosis not present

## 2022-09-24 DIAGNOSIS — S46011A Strain of muscle(s) and tendon(s) of the rotator cuff of right shoulder, initial encounter: Secondary | ICD-10-CM | POA: Diagnosis not present

## 2022-09-25 DIAGNOSIS — M25551 Pain in right hip: Secondary | ICD-10-CM | POA: Diagnosis not present

## 2022-09-25 DIAGNOSIS — Z79891 Long term (current) use of opiate analgesic: Secondary | ICD-10-CM | POA: Diagnosis not present

## 2022-09-25 DIAGNOSIS — E669 Obesity, unspecified: Secondary | ICD-10-CM | POA: Diagnosis not present

## 2022-09-25 DIAGNOSIS — M247 Protrusio acetabuli: Secondary | ICD-10-CM | POA: Diagnosis not present

## 2022-09-25 DIAGNOSIS — M797 Fibromyalgia: Secondary | ICD-10-CM | POA: Diagnosis not present

## 2022-09-25 DIAGNOSIS — G8929 Other chronic pain: Secondary | ICD-10-CM | POA: Diagnosis not present

## 2022-09-25 DIAGNOSIS — M25511 Pain in right shoulder: Secondary | ICD-10-CM | POA: Diagnosis not present

## 2022-09-25 DIAGNOSIS — M1611 Unilateral primary osteoarthritis, right hip: Secondary | ICD-10-CM | POA: Diagnosis not present

## 2022-09-25 DIAGNOSIS — Z5181 Encounter for therapeutic drug level monitoring: Secondary | ICD-10-CM | POA: Diagnosis not present

## 2022-09-27 ENCOUNTER — Other Ambulatory Visit: Payer: Self-pay | Admitting: Family Medicine

## 2022-09-27 DIAGNOSIS — Z1231 Encounter for screening mammogram for malignant neoplasm of breast: Secondary | ICD-10-CM

## 2022-10-02 DIAGNOSIS — Z23 Encounter for immunization: Secondary | ICD-10-CM | POA: Diagnosis not present

## 2022-10-04 ENCOUNTER — Ambulatory Visit (INDEPENDENT_AMBULATORY_CARE_PROVIDER_SITE_OTHER): Payer: Medicare PPO

## 2022-10-04 DIAGNOSIS — H01001 Unspecified blepharitis right upper eyelid: Secondary | ICD-10-CM | POA: Diagnosis not present

## 2022-10-04 DIAGNOSIS — H526 Other disorders of refraction: Secondary | ICD-10-CM | POA: Diagnosis not present

## 2022-10-04 DIAGNOSIS — J309 Allergic rhinitis, unspecified: Secondary | ICD-10-CM | POA: Diagnosis not present

## 2022-10-04 DIAGNOSIS — H02401 Unspecified ptosis of right eyelid: Secondary | ICD-10-CM | POA: Diagnosis not present

## 2022-10-04 DIAGNOSIS — H02402 Unspecified ptosis of left eyelid: Secondary | ICD-10-CM | POA: Diagnosis not present

## 2022-10-04 DIAGNOSIS — H01004 Unspecified blepharitis left upper eyelid: Secondary | ICD-10-CM | POA: Diagnosis not present

## 2022-10-04 DIAGNOSIS — Z961 Presence of intraocular lens: Secondary | ICD-10-CM | POA: Diagnosis not present

## 2022-10-04 DIAGNOSIS — H43813 Vitreous degeneration, bilateral: Secondary | ICD-10-CM | POA: Diagnosis not present

## 2022-10-05 DIAGNOSIS — M7521 Bicipital tendinitis, right shoulder: Secondary | ICD-10-CM | POA: Diagnosis not present

## 2022-10-05 DIAGNOSIS — M7541 Impingement syndrome of right shoulder: Secondary | ICD-10-CM | POA: Diagnosis not present

## 2022-10-09 ENCOUNTER — Ambulatory Visit (INDEPENDENT_AMBULATORY_CARE_PROVIDER_SITE_OTHER): Payer: Medicare PPO | Admitting: *Deleted

## 2022-10-09 DIAGNOSIS — J309 Allergic rhinitis, unspecified: Secondary | ICD-10-CM

## 2022-10-11 DIAGNOSIS — M247 Protrusio acetabuli: Secondary | ICD-10-CM | POA: Diagnosis not present

## 2022-10-11 DIAGNOSIS — M1611 Unilateral primary osteoarthritis, right hip: Secondary | ICD-10-CM | POA: Diagnosis not present

## 2022-10-16 NOTE — Progress Notes (Unsigned)
Follow Up Note  RE: Sara [name] MRN: [id number] DOB: 03-19-1951 Date of Office Visit: 10/17/2022  Referring provider: Silverio [name] Primary care provider: Silverio Decamp, MD  Chief Complaint: No chief complaint on file.  History of Present Illness: I had the pleasure of seeing Sara [name] for a follow up visit at the [facility] on 10/16/2022. She is a 71 y.o. female, who is being followed for allergic rhinoconjunctivitis on AIT, asthma, GERD, hymenoptera allergy and pruritus. Her previous allergy office visit was on 02/15/2022 with Dr. Maudie [name]. Today is a regular follow up visit.  Seasonal and perennial allergic rhinoconjunctivitis Past history - Perennial rhinoconjunctivitis symptoms for many years but noticed worsening the past year. 2022 skin testing showed: Positive to grass pollen, ragweed pollen, weed pollen, mold, cat, and borderline to dog. Started AIT on 08/29/2021 (G-RW-T, M-C-D) Interim history - stable with below regimen. Continue environmental control measures.  Continue allergy injections.  Take carbinoxamine '4mg'$  (1 to 1.5 tablet) twice a day as needed. May use azelastine nasal spray 1-2 sprays per nostril twice a day as needed for runny nose/drainage. May use Flonase (fluticasone) nasal spray 1 spray per nostril twice a day as needed for nasal congestion.  Nasal saline spray (i.e., Simply Saline) or nasal saline lavage (i.e., NeilMed) is recommended as needed and prior to medicated nasal sprays. Continue Singulair (montelukast) '10mg'$  daily at night.   Asthma Past history - Diagnosed with asthma over 20 years ago and has weekly coughing, shortness of breath and chest tightness.  Does not like to use albuterol as it causes palpitations. 2022 spirometry was normal. Interim history - no rescue inhaler use.  May use levoalbuterol rescue inhaler 2 puffs every 4 to 6 hours as needed for shortness of breath, chest tightness, coughing, and  wheezing. Monitor frequency of use.    GERD (gastroesophageal reflux disease) Controlled.  Continue lifestyle and dietary modifications. Continue lansoprazole '30mg'$  daily as prescribed.   Hymenoptera allergy Past history - Throat tightness and trouble breathing in the past which required EpiPen use once.  2022 bloodwork negative. Interim history - No stings since last visit.  Avoid bee stings. For mild symptoms you can take over the counter antihistamines such as Benadryl and monitor symptoms closely. If symptoms worsen or if you have severe symptoms including breathing issues, throat closure, significant swelling, whole body hives, severe diarrhea and vomiting, lightheadedness then inject epinephrine and seek immediate medical care afterwards. Action plan in place. If has another reaction - consider undergoing skin testing which is done in our [facility] office once a month as there are a small subset of people who react via skin testing but have negative bloodwork.    Pruritus Pruritus only on upper extremities b/l since she had an accident. No associated rash. Not worse since started AIT. Had PT, EMG. On Gabapentin, topical lidocaine. Discussed with patient that her symptoms do not seem to be allergy related regarding the itching.  Continue to use the lidocaine topical as needed. Moisturize daily.   Return in about 1 year (around 02/15/2023).  Assessment and Plan: Sara [name] is a 71 y.o. female with: No problem-specific Assessment & Plan notes found for this encounter.  No follow-ups on file.  No orders of the defined types were placed in this encounter.  Lab Orders  No laboratory test(s) ordered today    Diagnostics: Spirometry:  Tracings reviewed. Her effort: {Blank single:19197::"Good reproducible efforts.","It was hard to get consistent efforts and there  is a question as to whether this reflects a maximal maneuver.","Poor effort, data can not be interpreted."} FVC:  ***L FEV1: ***L, ***% predicted FEV1/FVC ratio: ***% Interpretation: {Blank single:19197::"Spirometry consistent with mild obstructive disease","Spirometry consistent with moderate obstructive disease","Spirometry consistent with severe obstructive disease","Spirometry consistent with possible restrictive disease","Spirometry consistent with mixed obstructive and restrictive disease","Spirometry uninterpretable due to technique","Spirometry consistent with normal pattern","No overt abnormalities noted given today's efforts"}.  Please see scanned spirometry results for details.  Skin Testing: {Blank single:19197::"Select foods","Environmental allergy panel","Environmental allergy panel and select foods","Food allergy panel","None","Deferred due to recent antihistamines use"}. *** Results discussed with patient/family.   Medication List:  Current Outpatient Medications  Medication Sig Dispense Refill  . amLODipine (NORVASC) 10 MG tablet TAKE 1 TABLET BY MOUTH EVERY DAY 90 tablet 0  . atorvastatin (LIPITOR) 10 MG tablet TAKE 1 TABLET (10 MG TOTAL) BY MOUTH DAILY AT 6 PM. 90 tablet 3  . Azelastine HCl 137 MCG/SPRAY SOLN USE 1-2 SPRAYS IN EACH NOSTRIL TWICE A DAY AS NEEDED 30 mL 5  . bimatoprost (LATISSE) 0.03 % ophthalmic solution bimatoprost 0.03 % drops with applicator, eyelash base  PLEASE SEE ATTACHED FOR DETAILED DIRECTIONS    . Calcium Carbonate-Vitamin D 600-400 MG-UNIT tablet Take 1 tablet by mouth 2 (two) times daily. 60 tablet 11  . Carbinoxamine Maleate 4 MG TABS Take 1 to 1.5 tablet twice a day as needed for allergies 60 tablet 3  . cycloSPORINE (RESTASIS) 0.05 % ophthalmic emulsion Restasis 0.05 % eye drops in a dropperette  LOCATION: BOTH EYES. INSTILL ONE DROP IN EACH EYE ONCE IN THE MORNING AND ONCE IN THE EVENING.    Marland Kitchen EPINEPHrine 0.3 mg/0.3 mL IJ SOAJ injection Inject 0.3 mg into the muscle as needed for anaphylaxis. 1 each 1  . FLOVENT HFA 110 MCG/ACT inhaler INHALE 2 PUFFS  INTO THE LUNGS 2 TIMES DAILY. WITH SPACER. RINSE MOUTH AFTER USE.NEEDS APPOINTMENT 12 g 2  . fluticasone (FLONASE) 50 MCG/ACT nasal spray PLACE 2 SPRAYS INTO BOTH NOSTRILS DAILY AS NEEDED FOR ALLERGIES OR RHINITIS. 48 mL 3  . gabapentin (NEURONTIN) 800 MG tablet TAKE 1 TABLET BY MOUTH THREE TIMES A DAY 270 tablet 1  . KLOR-CON M20 20 MEQ tablet TAKE 1 TABLET BY MOUTH EVERY DAY 90 tablet 3  . lansoprazole (PREVACID) 30 MG capsule Take by mouth.    . levalbuterol (XOPENEX HFA) 45 MCG/ACT inhaler Inhale 2 puffs into the lungs every 6 (six) hours as needed for wheezing or shortness of breath. 15 each 1  . lidocaine (XYLOCAINE) 5 % ointment Apply topically.    Marland Kitchen linaclotide (LINZESS) 145 MCG CAPS capsule Take by mouth.    Marland Kitchen lisinopril-hydrochlorothiazide (ZESTORETIC) 20-25 MG tablet TAKE 1 TABLET BY MOUTH EVERY DAY 90 tablet 0  . meloxicam (MOBIC) 15 MG tablet Take 1 tablet (15 mg total) by mouth daily. 30 tablet 2  . metoprolol succinate (TOPROL-XL) 100 MG 24 hr tablet TAKE 1 TABLET BY MOUTH EVERY DAY WITH OR IMMEDIATELY FOLLOWING A MEAL 90 tablet 0  . metoprolol tartrate (LOPRESSOR) 50 MG tablet     . montelukast (SINGULAIR) 10 MG tablet Take 1 tablet (10 mg total) by mouth at bedtime. 30 tablet 5  . tiZANidine (ZANAFLEX) 4 MG tablet TAKE 1 TABLET BY MOUTH NIGHTLY AS NEEDED. 90 tablet 3  . traMADol (ULTRAM) 50 MG tablet TAKE 1 TABLET (50 MG TOTAL) BY MOUTH EVERY 8 (EIGHT) HOURS AS NEEDED FOR MODERATE PAIN 90 tablet 0  . traZODone (DESYREL) 150 MG tablet  TAKE 1 TABLET BY MOUTH AT BEDTIME. 90 tablet 1  . zolpidem (AMBIEN) 10 MG tablet TAKE 1 TABLET BY MOUTH EVERYDAY AT BEDTIME 90 tablet 0   No current facility-administered medications for this visit.   Allergies: Allergies  Allergen Reactions  . Bee Venom Anaphylaxis   I reviewed her past medical history, social history, family history, and environmental history and no significant changes have been reported from her previous visit.  Review of  Systems  Constitutional:  Negative for appetite change, chills, fever and unexpected weight change.  HENT:  Negative for congestion, postnasal drip, rhinorrhea and sneezing.   Eyes:  Negative for itching.  Respiratory:  Negative for cough, chest tightness, shortness of breath and wheezing.   Cardiovascular:  Negative for chest pain.  Gastrointestinal:  Negative for abdominal pain.  Genitourinary:  Negative for difficulty urinating.  Skin:  Negative for rash.       itching  Allergic/Immunologic: Positive for environmental allergies.  Neurological:  Negative for headaches.   Objective: There were no vitals taken for this visit. There is no height or weight on file to calculate BMI. Physical Exam Vitals and nursing note reviewed.  Constitutional:      Appearance: Normal appearance. She is well-developed.  HENT:     Head: Normocephalic and atraumatic.     Right Ear: Tympanic membrane and external ear normal.     Left Ear: Tympanic membrane and external ear normal.     Nose: Nose normal.     Mouth/Throat:     Mouth: Mucous membranes are moist.     Pharynx: Oropharynx is clear.  Eyes:     Conjunctiva/sclera: Conjunctivae normal.  Cardiovascular:     Rate and Rhythm: Normal rate and regular rhythm.     Heart sounds: Normal heart sounds. No murmur heard.    No friction rub. No gallop.  Pulmonary:     Effort: Pulmonary effort is normal.     Breath sounds: Normal breath sounds. No wheezing, rhonchi or rales.  Musculoskeletal:     Cervical back: Neck supple.  Skin:    General: Skin is warm.     Findings: No rash.  Neurological:     Mental Status: She is alert and oriented to person, place, and time.  Psychiatric:        Behavior: Behavior normal.  Previous notes and tests were reviewed. The plan was reviewed with the patient/family, and all questions/concerned were addressed.  It was my pleasure to see Sara [name] today and participate in her care. Please feel free to contact me  with any questions or concerns.  Sincerely,  Rexene Alberts, DO Allergy & Immunology  [facility] office: [place] office: [phone]

## 2022-10-17 ENCOUNTER — Ambulatory Visit (INDEPENDENT_AMBULATORY_CARE_PROVIDER_SITE_OTHER): Payer: Medicare PPO | Admitting: *Deleted

## 2022-10-17 ENCOUNTER — Ambulatory Visit: Payer: Medicare PPO | Admitting: Allergy

## 2022-10-17 ENCOUNTER — Encounter: Payer: Self-pay | Admitting: Allergy

## 2022-10-17 VITALS — BP 122/70 | HR 71 | Temp 98.0°F | Resp 16

## 2022-10-17 DIAGNOSIS — J309 Allergic rhinitis, unspecified: Secondary | ICD-10-CM

## 2022-10-17 DIAGNOSIS — Z91038 Other insect allergy status: Secondary | ICD-10-CM

## 2022-10-17 DIAGNOSIS — J4520 Mild intermittent asthma, uncomplicated: Secondary | ICD-10-CM | POA: Diagnosis not present

## 2022-10-17 DIAGNOSIS — J302 Other seasonal allergic rhinitis: Secondary | ICD-10-CM | POA: Diagnosis not present

## 2022-10-17 DIAGNOSIS — K219 Gastro-esophageal reflux disease without esophagitis: Secondary | ICD-10-CM

## 2022-10-17 DIAGNOSIS — H1010 Acute atopic conjunctivitis, unspecified eye: Secondary | ICD-10-CM

## 2022-10-17 DIAGNOSIS — L299 Pruritus, unspecified: Secondary | ICD-10-CM | POA: Diagnosis not present

## 2022-10-17 DIAGNOSIS — Z713 Dietary counseling and surveillance: Secondary | ICD-10-CM | POA: Insufficient documentation

## 2022-10-17 MED ORDER — EPINEPHRINE 0.3 MG/0.3ML IJ SOAJ: 0.3000 mg | INTRAMUSCULAR | 1 refills | Status: DC | PRN

## 2022-10-17 MED ORDER — CARBINOXAMINE MALEATE 4 MG PO TABS: ORAL_TABLET | ORAL | 5 refills | Status: DC

## 2022-10-17 MED ORDER — MONTELUKAST SODIUM 10 MG PO TABS: 10.0000 mg | ORAL_TABLET | Freq: Every day | ORAL | 5 refills | Status: DC

## 2022-10-17 NOTE — Assessment & Plan Note (Signed)
Past history - Pruritus only on upper extremities b/l since she had an accident. No associated rash. Not worse since started AIT. Had PT, EMG. On Gabapentin, topical lidocaine. Interim history - following with neuro as also having burning/pain sensation with this.  . Discussed with patient that her symptoms do not seem to be allergy related regarding the itching.  . Moisturize daily. . Follow up with neurology.

## 2022-10-17 NOTE — Assessment & Plan Note (Signed)
Past history - Throat tightness and trouble breathing in the past which required EpiPen use once.  2022 bloodwork negative. Interim history - No stings.  Avoid bee stings.  For mild symptoms you can take over the counter antihistamines such as Benadryl and monitor symptoms closely. If symptoms worsen or if you have severe symptoms including breathing issues, throat closure, significant swelling, whole body hives, severe diarrhea and vomiting, lightheadedness then inject epinephrine and seek immediate medical care afterwards.  If has another reaction - consider undergoing skin testing which is done in our [facility] office once a month as there are a small subset of people who react via skin testing but have negative bloodwork.

## 2022-10-17 NOTE — Assessment & Plan Note (Signed)
Controlled.   Continue lifestyle and dietary modifications.  Continue lansoprazole '30mg'$  daily as prescribed.

## 2022-10-17 NOTE — Assessment & Plan Note (Signed)
Concerned about potential seafood/ocean allergy? Noted nasal congestion and feeling not her normal [place] when around the coast. She typically only eats seafood when at the beach. Denies any other symptoms.  . Try to eat seafood when you are not at the beach and see if you have the above symptoms.  If yes then avoid seafood and will get bloodwork next.

## 2022-10-17 NOTE — Assessment & Plan Note (Signed)
Past history - Perennial rhinoconjunctivitis symptoms for many years but noticed worsening the past year. 2022 skin testing showed: Positive to grass pollen, ragweed pollen, weed pollen, mold, cat, and borderline to dog. Started AIT on 08/29/2021 (G-RW-T, M-C-D). Interim history - controlled.   Continue environmental control measures.   Continue allergy injections - will go to every 2 weeks.   Take carbinoxamine '4mg'$  (1 to 1.5 tablet) twice a day as needed.  May use azelastine nasal spray 1-2 sprays per nostril twice a day as needed for runny nose/drainage.  May use Flonase (fluticasone) nasal spray 1 spray per nostril twice a day as needed for nasal congestion.   Nasal saline spray (i.e., Simply Saline) or nasal saline lavage (i.e., NeilMed) is recommended as needed and prior to medicated nasal sprays.  Continue Singulair (montelukast) '10mg'$  daily at night.

## 2022-10-17 NOTE — Patient Instructions (Addendum)
Seafood Try to eat seafood when you are not at the beach and see if you have those symptoms of nasal congestion and not feeling right. If yes then avoid seafood.   Environmental allergies 2022 skin testing showed: Positive to grass pollen, ragweed pollen, weed pollen, mold, cat, and borderline to dog.  Continue environmental control measures.  Continue allergy injections - will go to every 2 weeks.   Take carbinoxamine '4mg'$  (1 to 1.5 tablet) twice a day as needed. May use azelastine nasal spray 1-2 sprays per nostril twice a day as needed for runny nose/drainage. May use Flonase (fluticasone) nasal spray 1 spray per nostril twice a day as needed for nasal congestion.  Nasal saline spray (i.e., Simply Saline) or nasal saline lavage (i.e., NeilMed) is recommended as needed and prior to medicated nasal sprays. Continue Singulair (montelukast) '10mg'$  daily at night.  Asthma: May use levoalbuterol rescue inhaler 2 puffs every 4 to 6 hours as needed for shortness of breath, chest tightness, coughing, and wheezing. Monitor frequency of use.   Heartburn: Continue lifestyle and dietary modifications. Continue lansoprazole '30mg'$  daily as prescribed.  Bee sting reaction: Avoid bee stings. For mild symptoms you can take over the counter antihistamines such as Benadryl and monitor symptoms closely. If symptoms worsen or if you have severe symptoms including breathing issues, throat closure, significant swelling, whole body hives, severe diarrhea and vomiting, lightheadedness then inject epinephrine and seek immediate medical care afterwards. Action plan in place.  Follow up in 1 year or sooner if needed.

## 2022-10-17 NOTE — Assessment & Plan Note (Signed)
Past history - Diagnosed with asthma over 20 years ago and has weekly coughing, shortness of breath and chest tightness.  Does not like to use albuterol as it causes palpitations. 2022 spirometry was normal. Interim history - rare rescue inhaler use.    May use levoalbuterol rescue inhaler 2 puffs every 4 to 6 hours as needed for shortness of breath, chest tightness, coughing, and wheezing.  Monitor frequency of use.

## 2022-10-22 ENCOUNTER — Ambulatory Visit
Admission: RE | Admit: 2022-10-22 | Discharge: 2022-10-22 | Disposition: A | Discharge status: Home / Self Care | Payer: Medicare PPO | Source: Ambulatory Visit | Attending: Family Medicine | Admitting: Family Medicine

## 2022-10-22 DIAGNOSIS — Z1231 Encounter for screening mammogram for malignant neoplasm of breast: Secondary | ICD-10-CM | POA: Diagnosis not present

## 2022-10-24 DIAGNOSIS — M25551 Pain in right hip: Secondary | ICD-10-CM | POA: Diagnosis not present

## 2022-10-24 DIAGNOSIS — M25511 Pain in right shoulder: Secondary | ICD-10-CM | POA: Diagnosis not present

## 2022-10-24 DIAGNOSIS — M25561 Pain in right knee: Secondary | ICD-10-CM | POA: Diagnosis not present

## 2022-10-24 DIAGNOSIS — G8929 Other chronic pain: Secondary | ICD-10-CM | POA: Diagnosis not present

## 2022-10-25 ENCOUNTER — Other Ambulatory Visit: Payer: Self-pay | Admitting: Family Medicine

## 2022-10-25 DIAGNOSIS — R928 Other abnormal and inconclusive findings on diagnostic imaging of breast: Secondary | ICD-10-CM

## 2022-10-29 ENCOUNTER — Ambulatory Visit (INDEPENDENT_AMBULATORY_CARE_PROVIDER_SITE_OTHER): Payer: Medicare PPO

## 2022-10-29 DIAGNOSIS — J309 Allergic rhinitis, unspecified: Secondary | ICD-10-CM

## 2022-10-29 IMAGING — MG MM DIGITAL DIAGNOSTIC UNILAT*L* W/ TOMO W/ CAD
6 series · 6 of 14 positions shown · non-contrast
Comparison: Previous exam(s).

CLINICAL DATA: Patient for short-term follow-up probably benign
left breast calcifications favored to represent fat necrosis from
reduction surgery.

EXAM:
DIGITAL DIAGNOSTIC UNILATERAL LEFT MAMMOGRAM WITH TOMOSYNTHESIS AND
CAD
TECHNIQUE: Left digital diagnostic mammography and breast tomosynthesis was
performed. The images were evaluated with computer-aided detection.

[L CC]
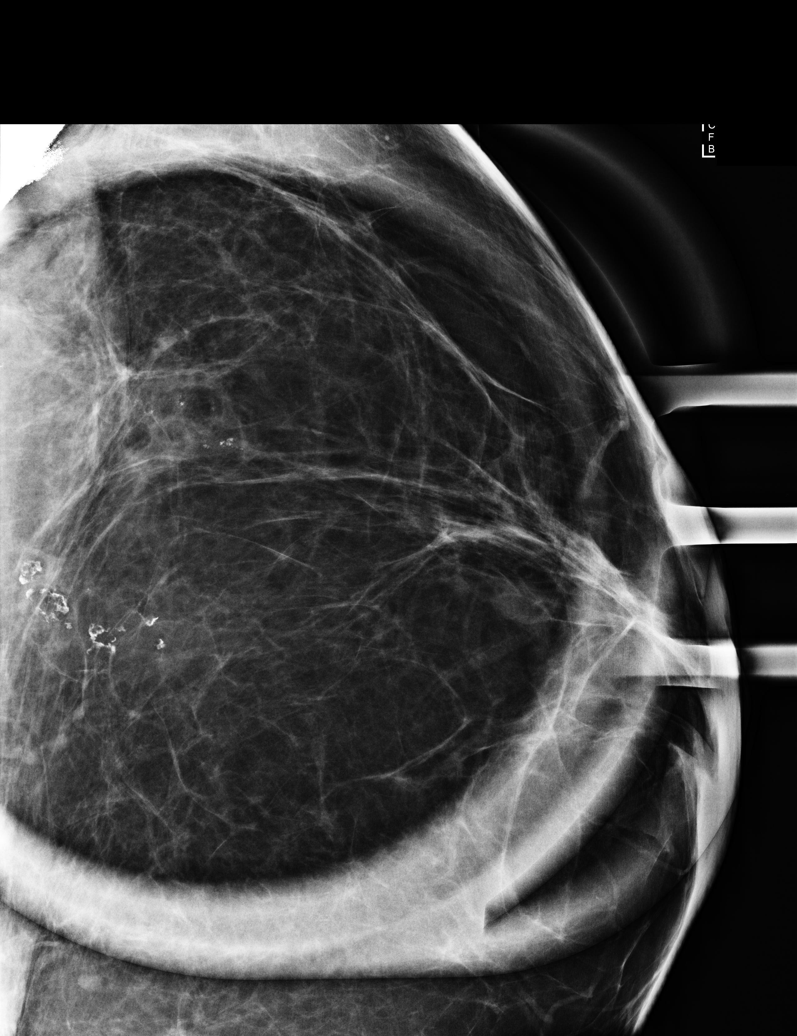

[L ML]
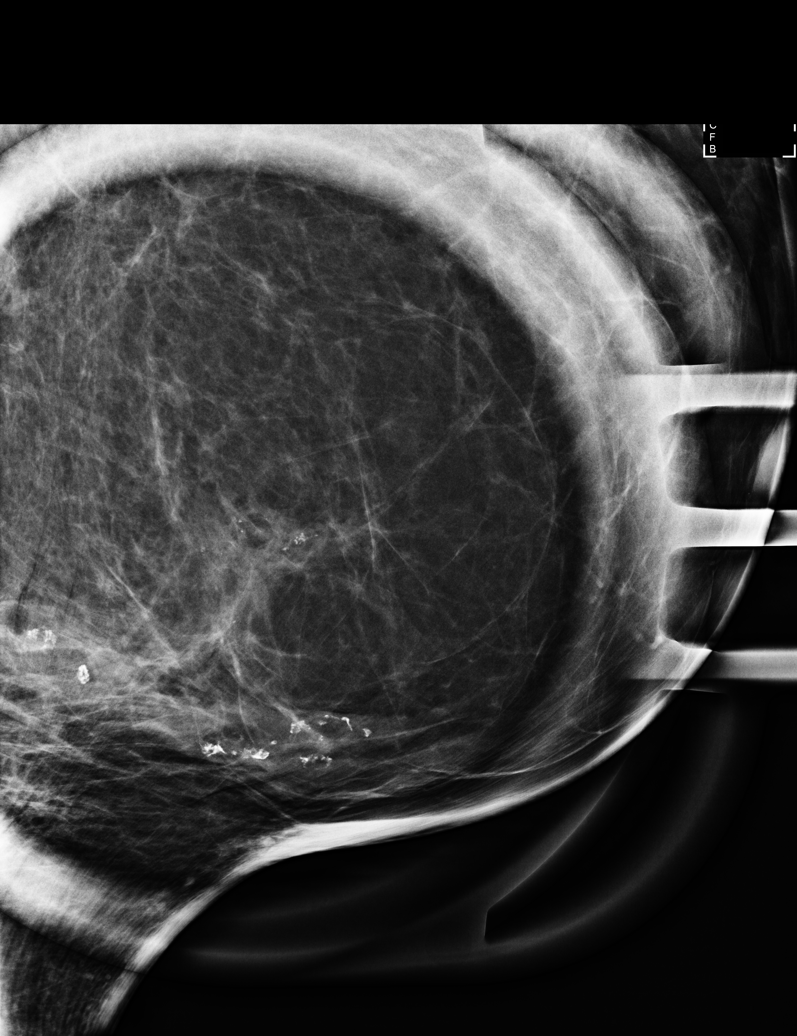

[L CC synth-2D]
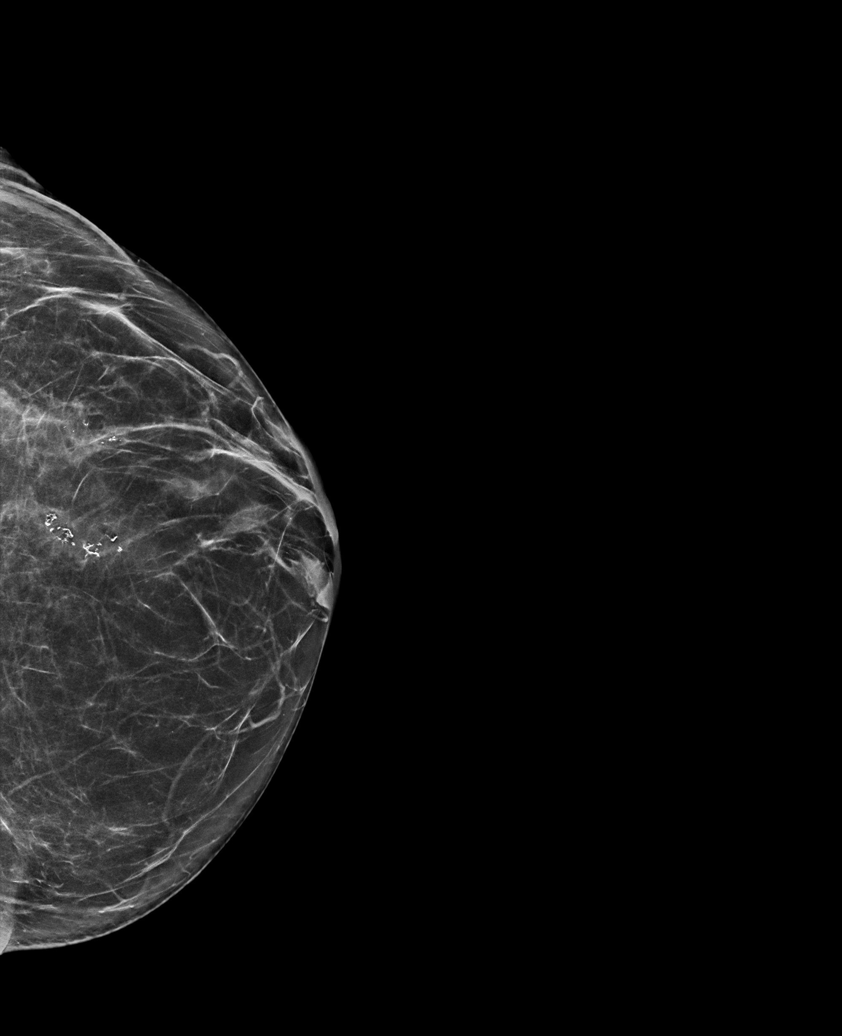

[L MLO synth-2D]
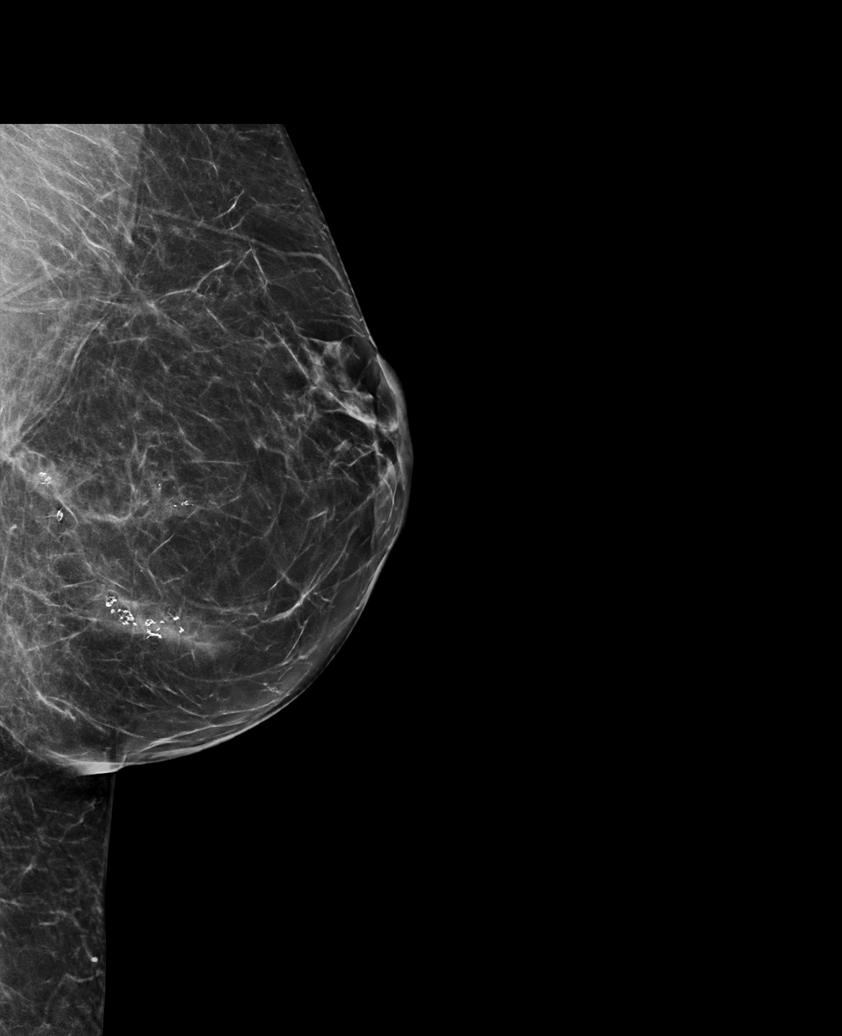

[L CC tomo · tomo slice 33/66.0]
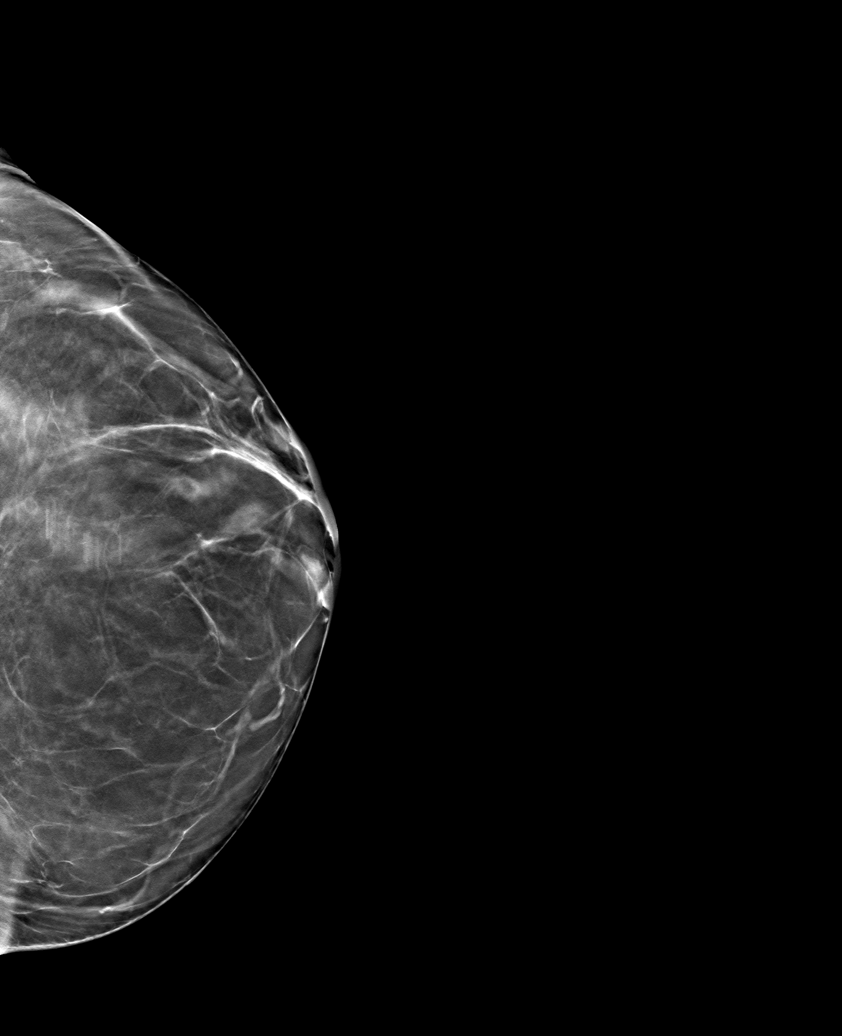

[L MLO tomo · tomo slice 37/72.0]
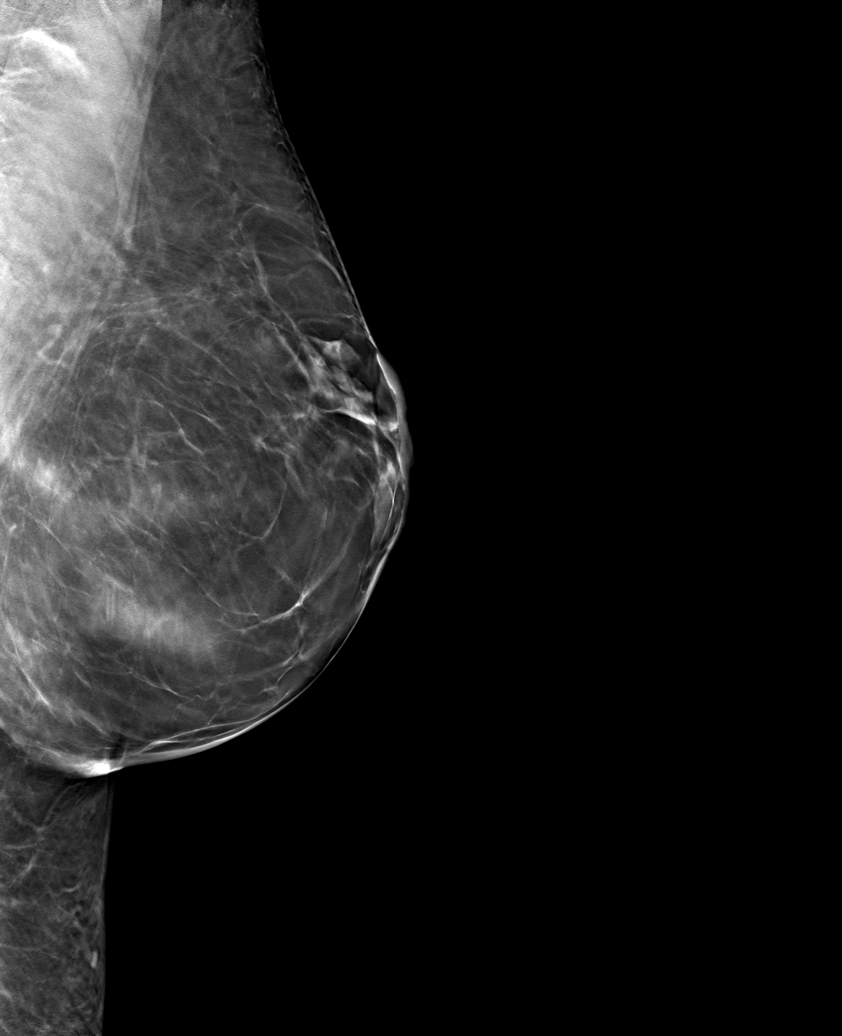

[6 of 14 positions shown; findings below may reference images not displayed]

ACR Breast Density Category b: There are scattered areas of
fibroglandular density.
FINDINGS: Magnification views of the left breast were obtained. Along the
lateral margin of the postsurgical site, there are a few new coarse
appearing calcifications which likely represent fat necrosis.
Additionally, within the central left breast slightly inferiorly
there are stable coarse appearing calcifications, which are also
favored to represent fat necrosis. No additional masses,
calcifications or nonsurgical distortion identified within either
breast.
IMPRESSION: Calcifications within the left breast favored to represent fat
necrosis from interval surgery, probably benign.

RECOMMENDATION:
Left breast diagnostic mammogram with magnification views in 3
months to reassess the left breast calcifications favored to be
secondary to fat necrosis.

I have discussed the findings and recommendations with the patient.
If applicable, a reminder letter will be sent to the patient
regarding the next appointment.

BI-RADS CATEGORY  3: Probably benign.

## 2022-10-30 DIAGNOSIS — G894 Chronic pain syndrome: Secondary | ICD-10-CM | POA: Diagnosis not present

## 2022-10-30 DIAGNOSIS — M4722 Other spondylosis with radiculopathy, cervical region: Secondary | ICD-10-CM | POA: Diagnosis not present

## 2022-10-30 DIAGNOSIS — Z79891 Long term (current) use of opiate analgesic: Secondary | ICD-10-CM | POA: Diagnosis not present

## 2022-10-30 DIAGNOSIS — F1190 Opioid use, unspecified, uncomplicated: Secondary | ICD-10-CM | POA: Diagnosis not present

## 2022-10-30 DIAGNOSIS — M25551 Pain in right hip: Secondary | ICD-10-CM | POA: Diagnosis not present

## 2022-10-30 DIAGNOSIS — M1611 Unilateral primary osteoarthritis, right hip: Secondary | ICD-10-CM | POA: Diagnosis not present

## 2022-11-05 ENCOUNTER — Other Ambulatory Visit: Payer: Self-pay | Admitting: Family Medicine

## 2022-11-05 ENCOUNTER — Ambulatory Visit
Admission: RE | Admit: 2022-11-05 | Discharge: 2022-11-05 | Disposition: A | Discharge status: Home / Self Care | Payer: Medicare PPO | Source: Ambulatory Visit | Attending: Family Medicine | Admitting: Family Medicine

## 2022-11-05 DIAGNOSIS — R921 Mammographic calcification found on diagnostic imaging of breast: Secondary | ICD-10-CM | POA: Diagnosis not present

## 2022-11-05 DIAGNOSIS — R928 Other abnormal and inconclusive findings on diagnostic imaging of breast: Secondary | ICD-10-CM

## 2022-11-06 ENCOUNTER — Other Ambulatory Visit: Payer: Self-pay | Admitting: Sports Medicine

## 2022-11-06 DIAGNOSIS — I10 Essential (primary) hypertension: Secondary | ICD-10-CM

## 2022-11-07 ENCOUNTER — Ambulatory Visit (INDEPENDENT_AMBULATORY_CARE_PROVIDER_SITE_OTHER): Payer: Medicare PPO | Admitting: *Deleted

## 2022-11-07 DIAGNOSIS — J309 Allergic rhinitis, unspecified: Secondary | ICD-10-CM

## 2022-11-08 DIAGNOSIS — M4802 Spinal stenosis, cervical region: Secondary | ICD-10-CM | POA: Diagnosis not present

## 2022-11-08 DIAGNOSIS — M4722 Other spondylosis with radiculopathy, cervical region: Secondary | ICD-10-CM | POA: Diagnosis not present

## 2022-11-13 ENCOUNTER — Ambulatory Visit: Payer: Self-pay | Admitting: Licensed Clinical Social Worker

## 2022-11-13 NOTE — Patient Outreach (Signed)
  Care Coordination   11/13/2022 Name: Sara [name] MRN: [id number] DOB: Aug 27, 1951   Care Coordination Outreach Attempts:  An unsuccessful telephone outreach was attempted today to offer the patient information about available care coordination services as a benefit of their health plan.   Follow Up Plan:  Additional outreach attempts will be made to offer the patient care coordination information and services.   Encounter Outcome:  No Answer  Care Coordination Interventions Activated:  Yes   Care Coordination Interventions:  Yes, provided    Norva Riffle.Nichoals Heyde MSW, [employer] representative [facility] Care Management [phone]

## 2022-11-13 NOTE — Patient Instructions (Signed)
Visit Information  Thank you for taking time to visit with me today. Please don't hesitate to contact me if I can be of assistance to you before our next scheduled telephone appointment.  Following are the goals we discussed today:   Our next appointment is by telephone on 12/18/22 at 10:00 AM   Please call the care guide team at [phone] if you need to cancel or reschedule your appointment.   If you are experiencing a Mental Health or [place] or need someone to talk to, please go to [facility] [phone])   Following is a copy of your full plan of care:   Interventions: LCSW called client phone number today but was not able to speak with client. LCSW left phone message for client asking her to call LCSW at [phone]  Ms. Cullinan was given information about Care Management services by the embedded care coordination team including:  Care Management services include personalized support from designated clinical staff supervised by her physician, including individualized plan of care and coordination with other care providers 24/7 contact phone numbers for assistance for urgent and routine care needs. The patient may stop CCM services at any time (effective at the end of the month) by phone call to the office staff.  Patient agreed to services and verbal consent obtained.   Norva Riffle.Quamel Fitzmaurice MSW, [employer] representative [facility] Care Management [phone]

## 2022-11-13 NOTE — Progress Notes (Signed)
VIALS EXP 11-14-23

## 2022-11-14 DIAGNOSIS — E785 Hyperlipidemia, unspecified: Secondary | ICD-10-CM | POA: Diagnosis not present

## 2022-11-14 DIAGNOSIS — Z0000 Encounter for general adult medical examination without abnormal findings: Secondary | ICD-10-CM | POA: Diagnosis not present

## 2022-11-14 DIAGNOSIS — K219 Gastro-esophageal reflux disease without esophagitis: Secondary | ICD-10-CM | POA: Diagnosis not present

## 2022-11-14 DIAGNOSIS — J302 Other seasonal allergic rhinitis: Secondary | ICD-10-CM | POA: Diagnosis not present

## 2022-11-14 DIAGNOSIS — I10 Essential (primary) hypertension: Secondary | ICD-10-CM | POA: Diagnosis not present

## 2022-11-14 DIAGNOSIS — G4733 Obstructive sleep apnea (adult) (pediatric): Secondary | ICD-10-CM | POA: Diagnosis not present

## 2022-11-15 DIAGNOSIS — J3081 Allergic rhinitis due to animal (cat) (dog) hair and dander: Secondary | ICD-10-CM | POA: Diagnosis not present

## 2022-11-16 ENCOUNTER — Ambulatory Visit
Admission: RE | Admit: 2022-11-16 | Discharge: 2022-11-16 | Disposition: A | Discharge status: Home / Self Care | Payer: Medicare PPO | Source: Ambulatory Visit | Attending: Family Medicine | Admitting: Family Medicine

## 2022-11-16 DIAGNOSIS — R921 Mammographic calcification found on diagnostic imaging of breast: Secondary | ICD-10-CM | POA: Diagnosis not present

## 2022-11-16 DIAGNOSIS — R928 Other abnormal and inconclusive findings on diagnostic imaging of breast: Secondary | ICD-10-CM

## 2022-11-16 DIAGNOSIS — N641 Fat necrosis of breast: Secondary | ICD-10-CM | POA: Diagnosis not present

## 2022-11-16 DIAGNOSIS — N6032 Fibrosclerosis of left breast: Secondary | ICD-10-CM | POA: Diagnosis not present

## 2022-11-16 HISTORY — PX: BREAST BIOPSY: SHX20

## 2022-11-19 DIAGNOSIS — M25551 Pain in right hip: Secondary | ICD-10-CM | POA: Diagnosis not present

## 2022-11-19 DIAGNOSIS — M1611 Unilateral primary osteoarthritis, right hip: Secondary | ICD-10-CM | POA: Diagnosis not present

## 2022-11-26 ENCOUNTER — Other Ambulatory Visit: Payer: Self-pay | Admitting: Sports Medicine

## 2022-11-26 DIAGNOSIS — M79604 Pain in right leg: Secondary | ICD-10-CM | POA: Diagnosis not present

## 2022-11-26 DIAGNOSIS — R600 Localized edema: Secondary | ICD-10-CM | POA: Diagnosis not present

## 2022-11-26 DIAGNOSIS — M79605 Pain in left leg: Secondary | ICD-10-CM | POA: Diagnosis not present

## 2022-11-27 ENCOUNTER — Ambulatory Visit (INDEPENDENT_AMBULATORY_CARE_PROVIDER_SITE_OTHER): Payer: Medicare PPO | Admitting: *Deleted

## 2022-11-27 DIAGNOSIS — J309 Allergic rhinitis, unspecified: Secondary | ICD-10-CM

## 2022-12-10 ENCOUNTER — Ambulatory Visit (INDEPENDENT_AMBULATORY_CARE_PROVIDER_SITE_OTHER): Payer: Medicare PPO

## 2022-12-10 DIAGNOSIS — J309 Allergic rhinitis, unspecified: Secondary | ICD-10-CM

## 2022-12-18 ENCOUNTER — Encounter: Payer: Medicare PPO | Admitting: Licensed Clinical Social Worker

## 2022-12-19 ENCOUNTER — Other Ambulatory Visit: Payer: Self-pay | Admitting: Sports Medicine

## 2022-12-19 DIAGNOSIS — I10 Essential (primary) hypertension: Secondary | ICD-10-CM

## 2022-12-26 ENCOUNTER — Ambulatory Visit (INDEPENDENT_AMBULATORY_CARE_PROVIDER_SITE_OTHER): Payer: Medicare PPO

## 2022-12-26 DIAGNOSIS — J309 Allergic rhinitis, unspecified: Secondary | ICD-10-CM | POA: Diagnosis not present

## 2022-12-27 ENCOUNTER — Encounter: Payer: Self-pay | Admitting: Allergy

## 2022-12-27 ENCOUNTER — Ambulatory Visit (INDEPENDENT_AMBULATORY_CARE_PROVIDER_SITE_OTHER): Payer: Medicare PPO | Admitting: Allergy

## 2022-12-27 DIAGNOSIS — J988 Other specified respiratory disorders: Secondary | ICD-10-CM | POA: Insufficient documentation

## 2022-12-27 DIAGNOSIS — H1013 Acute atopic conjunctivitis, bilateral: Secondary | ICD-10-CM

## 2022-12-27 DIAGNOSIS — J4520 Mild intermittent asthma, uncomplicated: Secondary | ICD-10-CM

## 2022-12-27 DIAGNOSIS — K219 Gastro-esophageal reflux disease without esophagitis: Secondary | ICD-10-CM

## 2022-12-27 DIAGNOSIS — Z91038 Other insect allergy status: Secondary | ICD-10-CM

## 2022-12-27 DIAGNOSIS — J302 Other seasonal allergic rhinitis: Secondary | ICD-10-CM | POA: Diagnosis not present

## 2022-12-27 DIAGNOSIS — H1010 Acute atopic conjunctivitis, unspecified eye: Secondary | ICD-10-CM

## 2022-12-27 DIAGNOSIS — Z713 Dietary counseling and surveillance: Secondary | ICD-10-CM

## 2022-12-27 DIAGNOSIS — J3089 Other allergic rhinitis: Secondary | ICD-10-CM

## 2022-12-27 MED ORDER — LANSOPRAZOLE 30 MG PO CPDR: 30.0000 mg | DELAYED_RELEASE_CAPSULE | Freq: Two times a day (BID) | ORAL | 0 refills | Status: DC

## 2022-12-27 MED ORDER — IPRATROPIUM BROMIDE 0.03 % NA SOLN: 1.0000 | Freq: Two times a day (BID) | NASAL | 3 refills | Status: DC | PRN

## 2022-12-27 NOTE — Assessment & Plan Note (Signed)
Past history - Concerned about potential seafood/ocean allergy? Noted nasal congestion and feeling not her normal self when around the coast. She typically only eats seafood when at the beach. Denies any other symptoms.  Try to eat seafood when you are not at the beach and see if you have the above symptoms. If yes then avoid seafood and will get bloodwork next.

## 2022-12-27 NOTE — Assessment & Plan Note (Signed)
Past history - Throat tightness and trouble breathing in the past which required EpiPen use once.  2022 bloodwork negative. Avoid bee stings. For mild symptoms you can take over the counter antihistamines such as Benadryl and monitor symptoms closely. If symptoms worsen or if you have severe symptoms including breathing issues, throat closure, significant swelling, whole body hives, severe diarrhea and vomiting, lightheadedness then inject epinephrine and seek immediate medical care afterwards. If has another reaction - consider undergoing skin testing which is done in our [facility] office once a month as there are a small subset of people who react via skin testing but have negative bloodwork.

## 2022-12-27 NOTE — Patient Instructions (Addendum)
Respiratory infection See below for symptomatic management. No indication for antibiotics/prednisone at this time.   Environmental allergies 2022 skin testing showed: Positive to grass pollen, ragweed pollen, weed pollen, mold, cat, and borderline to dog.  Continue environmental control measures.  Continue allergy injections - may get every 1-2 weeks.   Take carbinoxamine '4mg'$  (1 to 1.5 tablet) twice a day as needed. Stop azelastine nasal spray. Use Atrovent (ipratropium) 0.03% 1-2 sprays per nostril twice a day as needed for runny nose/drainage. May use Flonase (fluticasone) nasal spray 1 spray per nostril twice a day as needed for nasal congestion.  Nasal saline spray (i.e., Simply Saline) or nasal saline lavage (i.e., NeilMed) is recommended as needed and prior to medicated nasal sprays. Continue Singulair (montelukast) '10mg'$  daily at night.  Asthma: May use levoalbuterol rescue inhaler 2 puffs every 4 to 6 hours as needed for shortness of breath, chest tightness, coughing, and wheezing. Monitor frequency of use.   Heartburn: Continue lifestyle and dietary modifications. Take lansoprazole '30mg'$  twice a day for 2 weeks then go back to once a day as before.   Bee sting reaction: Avoid bee stings. For mild symptoms you can take over the counter antihistamines such as Benadryl and monitor symptoms closely. If symptoms worsen or if you have severe symptoms including breathing issues, throat closure, significant swelling, whole body hives, severe diarrhea and vomiting, lightheadedness then inject epinephrine and seek immediate medical care afterwards. Action plan in place.  Seafood Try to eat seafood when you are not at the beach and see if you have those symptoms of nasal congestion and not feeling right. If yes then avoid seafood.   Follow up in 6 months or sooner if needed.   Drink plenty of fluids. Water, juice, clear broth or warm lemon water are good choices. Avoid caffeine and  alcohol, which can dehydrate you. Eat chicken soup. Chicken soup and other warm fluids can be soothing and loosen congestion. Rest. Adjust your room's temperature and humidity. Keep your room warm but not overheated. If the air is dry, a cool-mist humidifier or vaporizer can moisten the air and help ease congestion and coughing. Keep the humidifier clean to prevent the growth of bacteria and molds. Soothe your throat. Perform a saltwater gargle. Dissolve one-quarter to a half teaspoon of salt in a 4- to 8-ounce glass of warm water. This can relieve a sore or scratchy throat temporarily. Use saline nasal drops. To help relieve nasal congestion, try saline nasal drops. You can buy these drops over the counter, and they can help relieve symptoms ? even in children. Take over-the-counter cold and cough medications. For adults and children older than 5, over-the-counter decongestants, antihistamines and pain relievers might offer some symptom relief. However, they won't prevent a cold or shorten its duration.

## 2022-12-27 NOTE — Progress Notes (Signed)
RE: Sara [name] MRN: [id number] DOB: 10-20-1951 Date of Telemedicine Visit: 12/27/2022  Referring provider: Blima Rich* Primary care provider: Javier Glazier, PA-C  Chief Complaint: Allergies (Sore throat and a lot of congestion with a headache. Every two weeks for injections and that's when her symptoms started)   Telemedicine Follow Up Visit via Telephone: I connected with Sara [name] for a follow up on 12/27/22 by telephone and verified that I am speaking with the correct person using two identifiers.   I discussed the limitations, risks, security and privacy concerns of performing an evaluation and management service by telephone and the availability of in person appointments. I also discussed with the patient that there may be a patient responsible charge related to this service. The patient expressed understanding and agreed to proceed.  Patient is at home/work.  Provider is at the office.  Visit start time: 10:05AM Visit end time: 10:23AM Insurance consent/check in by: front desk Medical consent and medical assistant/nurse: Lachelle S.  History of Present Illness: She is a 71 y.o. female, who is being followed for allergic rhinoconjunctivitis on AIT, asthma, GERD, hymenoptera allergy and pruritus. Her previous allergy office visit was on 10/17/2022 with Dr. Maudie Mercury. Today is a new complaint visit of nasal congestion.  Patient noted nasal congestion, clear rhinorrhea, sore throat, raspy voice. No fevers/chills.   On 12/16/2022 went to the ER and had negative to covid-19 and strep throat. Slowly feeling better. She got her allergy shot yesterday. Feels better when gets it every week.   Currently taking carbinoxamine '6mg'$  BID, azelastine, Flonase BID. No nosebleeds. Still taking Singulair '10mg'$  daily.   Asthma Denies any SOB, wheezing, chest tightness, nocturnal awakenings, ER/urgent care visits or prednisone use since the last visit.  GERD  (gastroesophageal reflux disease) Taking lansoprazole '30mg'$  daily. Maybe having flare in symptoms.    Assessment and Plan: Nabilah is a 71 y.o. female with: Respiratory infection Negative covid and strep test. Doing better but still having PND, clear rhinorrhea and sore throat. See below for symptomatic management. No indication for antibiotics/prednisone at this time.  Will try Atrovent nasal spray for PND and increase PPI to BID x 2 weeks.  Seasonal and perennial allergic rhinoconjunctivitis Past history - Perennial rhinoconjunctivitis symptoms for many years but noticed worsening the past year. 2022 skin testing showed: Positive to grass pollen, ragweed pollen, weed pollen, mold, cat, and borderline to dog. Started AIT on 08/29/2021 (G-RW-T, M-C-D). Interim history - felt better when was on weekly injections.   Continue environmental control measures.  Continue allergy injections - may get every 1-2 weeks.  Take carbinoxamine '4mg'$  (1 to 1.5 tablet) twice a day as needed. Stop azelastine nasal spray. Use Atrovent (ipratropium) 0.03% 1-2 sprays per nostril twice a day as needed for runny nose/drainage. May use Flonase (fluticasone) nasal spray 1 spray per nostril twice a day as needed for nasal congestion.  Nasal saline spray (i.e., Simply Saline) or nasal saline lavage (i.e., NeilMed) is recommended as needed and prior to medicated nasal sprays. Continue Singulair (montelukast) '10mg'$  daily at night.  Asthma Past history - Diagnosed with asthma over 20 years ago and has weekly coughing, shortness of breath and chest tightness.  Does not like to use albuterol as it causes palpitations. 2022 spirometry was normal. May use levoalbuterol rescue inhaler 2 puffs every 4 to 6 hours as needed for shortness of breath, chest tightness, coughing, and wheezing. Monitor frequency of use.   GERD (gastroesophageal reflux disease) Continue lifestyle  and dietary modifications. Take lansoprazole '30mg'$  twice  a day for 2 weeks then go back to once a day as before.  Hymenoptera allergy Past history - Throat tightness and trouble breathing in the past which required EpiPen use once.  2022 bloodwork negative. Avoid bee stings. For mild symptoms you can take over the counter antihistamines such as Benadryl and monitor symptoms closely. If symptoms worsen or if you have severe symptoms including breathing issues, throat closure, significant swelling, whole body hives, severe diarrhea and vomiting, lightheadedness then inject epinephrine and seek immediate medical care afterwards. If has another reaction - consider undergoing skin testing which is done in our [facility] office once a month as there are a small subset of people who react via skin testing but have negative bloodwork.   Dietary counseling and surveillance Past history - Concerned about potential seafood/ocean allergy? Noted nasal congestion and feeling not her normal self when around the coast. She typically only eats seafood when at the beach. Denies any other symptoms.  Try to eat seafood when you are not at the beach and see if you have the above symptoms. If yes then avoid seafood and will get bloodwork next.   Return in about 6 months (around 06/28/2023).  Meds ordered this encounter  Medications   ipratropium (ATROVENT) 0.03 % nasal spray    Sig: Place 1-2 sprays into both nostrils 2 (two) times daily as needed (nasal drainage). This replaces the azelastine nasal spray.    Dispense:  30 mL    Refill:  3   lansoprazole (PREVACID) 30 MG capsule    Sig: Take 1 capsule (30 mg total) by mouth 2 (two) times daily before a meal.    Dispense:  60 capsule    Refill:  0   Lab Orders  No laboratory test(s) ordered today    Diagnostics: None.  Medication List:  Current Outpatient Medications  Medication Sig Dispense Refill   amLODipine (NORVASC) 10 MG tablet TAKE 1 TABLET BY MOUTH EVERY DAY 90 tablet 0   atorvastatin (LIPITOR) 10 MG  tablet TAKE 1 TABLET (10 MG TOTAL) BY MOUTH DAILY AT 6 PM. 90 tablet 3   bimatoprost (LATISSE) 0.03 % ophthalmic solution bimatoprost 0.03 % drops with applicator, eyelash base  PLEASE SEE ATTACHED FOR DETAILED DIRECTIONS     Calcium Carbonate-Vitamin D 600-400 MG-UNIT tablet Take 1 tablet by mouth 2 (two) times daily. 60 tablet 11   Carbinoxamine Maleate 4 MG TABS Take 1 to 1.5 tablet twice a day as needed for allergies 60 tablet 5   cycloSPORINE (RESTASIS) 0.05 % ophthalmic emulsion Restasis 0.05 % eye drops in a dropperette  LOCATION: BOTH EYES. INSTILL ONE DROP IN EACH EYE ONCE IN THE MORNING AND ONCE IN THE EVENING.     EPINEPHrine 0.3 mg/0.3 mL IJ SOAJ injection Inject 0.3 mg into the muscle as needed for anaphylaxis. 1 each 1   FLOVENT HFA 110 MCG/ACT inhaler INHALE 2 PUFFS INTO THE LUNGS 2 TIMES DAILY. WITH SPACER. RINSE MOUTH AFTER USE.NEEDS APPOINTMENT 12 g 2   fluticasone (FLONASE) 50 MCG/ACT nasal spray PLACE 2 SPRAYS INTO BOTH NOSTRILS DAILY AS NEEDED FOR ALLERGIES OR RHINITIS. 48 mL 3   gabapentin (NEURONTIN) 800 MG tablet TAKE 1 TABLET BY MOUTH THREE TIMES A DAY 270 tablet 1   ipratropium (ATROVENT) 0.03 % nasal spray Place 1-2 sprays into both nostrils 2 (two) times daily as needed (nasal drainage). This replaces the azelastine nasal spray. 30 mL 3  KLOR-CON M20 20 MEQ tablet TAKE 1 TABLET BY MOUTH EVERY DAY 90 tablet 3   lansoprazole (PREVACID) 30 MG capsule Take by mouth.     lansoprazole (PREVACID) 30 MG capsule Take 1 capsule (30 mg total) by mouth 2 (two) times daily before a meal. 60 capsule 0   levalbuterol (XOPENEX HFA) 45 MCG/ACT inhaler Inhale 2 puffs into the lungs every 6 (six) hours as needed for wheezing or shortness of breath. 15 each 1   lidocaine (XYLOCAINE) 5 % ointment Apply topically.     linaclotide (LINZESS) 145 MCG CAPS capsule Take by mouth.     lisinopril-hydrochlorothiazide (ZESTORETIC) 20-25 MG tablet TAKE 1 TABLET BY MOUTH EVERY DAY 90 tablet 0    meloxicam (MOBIC) 15 MG tablet Take 1 tablet (15 mg total) by mouth daily. 30 tablet 2   metoprolol tartrate (LOPRESSOR) 50 MG tablet      montelukast (SINGULAIR) 10 MG tablet Take 1 tablet (10 mg total) by mouth at bedtime. 30 tablet 5   tiZANidine (ZANAFLEX) 4 MG tablet TAKE 1 TABLET BY MOUTH NIGHTLY AS NEEDED. 90 tablet 3   traZODone (DESYREL) 150 MG tablet TAKE 1 TABLET BY MOUTH AT BEDTIME. 90 tablet 1   zolpidem (AMBIEN) 10 MG tablet TAKE 1 TABLET BY MOUTH EVERYDAY AT BEDTIME 90 tablet 0   No current facility-administered medications for this visit.   Allergies: Allergies  Allergen Reactions   Bee Venom Anaphylaxis   I reviewed her past medical history, social history, family history, and environmental history and no significant changes have been reported from her previous visit.  Review of Systems  Constitutional:  Negative for appetite change, chills, fever and unexpected weight change.  HENT:  Positive for postnasal drip and sore throat. Negative for congestion, rhinorrhea and sneezing.   Eyes:  Negative for itching.  Respiratory:  Negative for cough, chest tightness, shortness of breath and wheezing.   Cardiovascular:  Negative for chest pain.  Gastrointestinal:  Negative for abdominal pain.  Genitourinary:  Negative for difficulty urinating.  Skin:  Negative for rash.  Allergic/Immunologic: Positive for environmental allergies.  Neurological:  Negative for headaches.    Objective: Physical Exam Not obtained as encounter was done via telephone.   Previous notes and tests were reviewed.  I discussed the assessment and treatment plan with the patient. The patient was provided an opportunity to ask questions and all were answered. The patient agreed with the plan and demonstrated an understanding of the instructions. After visit summary/patient instructions available via mychart.   The patient was advised to call back or seek an in-person evaluation if the symptoms worsen  or if the condition fails to improve as anticipated.  I provided 18 minutes of non-face-to-face time during this encounter.  It was my pleasure to participate in [place] care today. Please feel free to contact me with any questions or concerns.   Sincerely,  Rexene Alberts, DO Allergy & Immunology  [facility] office: [place] office: [phone]

## 2022-12-27 NOTE — Assessment & Plan Note (Signed)
Past history - Diagnosed with asthma over 20 years ago and has weekly coughing, shortness of breath and chest tightness.  Does not like to use albuterol as it causes palpitations. 2022 spirometry was normal. May use levoalbuterol rescue inhaler 2 puffs every 4 to 6 hours as needed for shortness of breath, chest tightness, coughing, and wheezing. Monitor frequency of use.

## 2022-12-27 NOTE — Assessment & Plan Note (Signed)
Past history - Perennial rhinoconjunctivitis symptoms for many years but noticed worsening the past year. 2022 skin testing showed: Positive to grass pollen, ragweed pollen, weed pollen, mold, cat, and borderline to dog. Started AIT on 08/29/2021 (G-RW-T, M-C-D). Interim history - felt better when was on weekly injections.   Continue environmental control measures.  Continue allergy injections - may get every 1-2 weeks.  Take carbinoxamine '4mg'$  (1 to 1.5 tablet) twice a day as needed. Stop azelastine nasal spray. Use Atrovent (ipratropium) 0.03% 1-2 sprays per nostril twice a day as needed for runny nose/drainage. May use Flonase (fluticasone) nasal spray 1 spray per nostril twice a day as needed for nasal congestion.  Nasal saline spray (i.e., Simply Saline) or nasal saline lavage (i.e., NeilMed) is recommended as needed and prior to medicated nasal sprays. Continue Singulair (montelukast) '10mg'$  daily at night.

## 2022-12-27 NOTE — Assessment & Plan Note (Signed)
Continue lifestyle and dietary modifications. Take lansoprazole '30mg'$  twice a day for 2 weeks then go back to once a day as before.

## 2022-12-27 NOTE — Assessment & Plan Note (Signed)
Negative covid and strep test. Doing better but still having PND, clear rhinorrhea and sore throat. See below for symptomatic management. No indication for antibiotics/prednisone at this time.  Will try Atrovent nasal spray for PND and increase PPI to BID x 2 weeks.

## 2023-01-03 ENCOUNTER — Ambulatory Visit (INDEPENDENT_AMBULATORY_CARE_PROVIDER_SITE_OTHER): Payer: Medicare PPO

## 2023-01-03 DIAGNOSIS — J309 Allergic rhinitis, unspecified: Secondary | ICD-10-CM | POA: Diagnosis not present

## 2023-01-09 ENCOUNTER — Ambulatory Visit (INDEPENDENT_AMBULATORY_CARE_PROVIDER_SITE_OTHER): Payer: Medicare PPO

## 2023-01-09 DIAGNOSIS — J309 Allergic rhinitis, unspecified: Secondary | ICD-10-CM

## 2023-01-11 ENCOUNTER — Other Ambulatory Visit: Payer: Self-pay | Admitting: Allergy

## 2023-01-16 ENCOUNTER — Ambulatory Visit (INDEPENDENT_AMBULATORY_CARE_PROVIDER_SITE_OTHER): Payer: Medicare PPO

## 2023-01-16 DIAGNOSIS — J309 Allergic rhinitis, unspecified: Secondary | ICD-10-CM | POA: Diagnosis not present

## 2023-01-31 ENCOUNTER — Other Ambulatory Visit: Payer: Self-pay | Admitting: Sports Medicine

## 2023-01-31 DIAGNOSIS — I10 Essential (primary) hypertension: Secondary | ICD-10-CM

## 2023-02-07 ENCOUNTER — Ambulatory Visit (INDEPENDENT_AMBULATORY_CARE_PROVIDER_SITE_OTHER): Payer: Medicare PPO

## 2023-02-07 DIAGNOSIS — J309 Allergic rhinitis, unspecified: Secondary | ICD-10-CM

## 2023-02-08 ENCOUNTER — Other Ambulatory Visit: Payer: Self-pay | Admitting: Sports Medicine

## 2023-02-18 ENCOUNTER — Ambulatory Visit (INDEPENDENT_AMBULATORY_CARE_PROVIDER_SITE_OTHER): Payer: Medicare PPO

## 2023-02-18 DIAGNOSIS — J309 Allergic rhinitis, unspecified: Secondary | ICD-10-CM

## 2023-03-05 ENCOUNTER — Ambulatory Visit (INDEPENDENT_AMBULATORY_CARE_PROVIDER_SITE_OTHER): Payer: Medicare PPO

## 2023-03-05 DIAGNOSIS — J309 Allergic rhinitis, unspecified: Secondary | ICD-10-CM | POA: Diagnosis not present

## 2023-03-16 ENCOUNTER — Other Ambulatory Visit: Payer: Self-pay | Admitting: Sports Medicine

## 2023-03-16 DIAGNOSIS — I10 Essential (primary) hypertension: Secondary | ICD-10-CM

## 2023-03-21 ENCOUNTER — Ambulatory Visit (INDEPENDENT_AMBULATORY_CARE_PROVIDER_SITE_OTHER): Payer: Medicare PPO

## 2023-03-21 DIAGNOSIS — J309 Allergic rhinitis, unspecified: Secondary | ICD-10-CM | POA: Diagnosis not present

## 2023-03-27 ENCOUNTER — Other Ambulatory Visit: Payer: Self-pay | Admitting: Allergy

## 2023-04-02 ENCOUNTER — Ambulatory Visit (INDEPENDENT_AMBULATORY_CARE_PROVIDER_SITE_OTHER): Payer: Medicare PPO

## 2023-04-02 DIAGNOSIS — J309 Allergic rhinitis, unspecified: Secondary | ICD-10-CM

## 2023-04-17 ENCOUNTER — Ambulatory Visit (INDEPENDENT_AMBULATORY_CARE_PROVIDER_SITE_OTHER): Payer: Medicare PPO

## 2023-04-17 DIAGNOSIS — J309 Allergic rhinitis, unspecified: Secondary | ICD-10-CM | POA: Diagnosis not present

## 2023-05-01 DIAGNOSIS — J302 Other seasonal allergic rhinitis: Secondary | ICD-10-CM | POA: Diagnosis not present

## 2023-05-01 NOTE — Progress Notes (Signed)
EXP 04/30/24 

## 2023-05-02 DIAGNOSIS — J3081 Allergic rhinitis due to animal (cat) (dog) hair and dander: Secondary | ICD-10-CM

## 2023-05-13 ENCOUNTER — Ambulatory Visit (INDEPENDENT_AMBULATORY_CARE_PROVIDER_SITE_OTHER): Payer: Medicare PPO | Admitting: *Deleted

## 2023-05-13 DIAGNOSIS — J309 Allergic rhinitis, unspecified: Secondary | ICD-10-CM | POA: Diagnosis not present

## 2023-05-28 ENCOUNTER — Ambulatory Visit (INDEPENDENT_AMBULATORY_CARE_PROVIDER_SITE_OTHER): Payer: Medicare PPO | Admitting: *Deleted

## 2023-05-28 DIAGNOSIS — J309 Allergic rhinitis, unspecified: Secondary | ICD-10-CM | POA: Diagnosis not present

## 2023-06-11 ENCOUNTER — Telehealth: Payer: Self-pay | Admitting: Allergy

## 2023-06-11 ENCOUNTER — Ambulatory Visit (INDEPENDENT_AMBULATORY_CARE_PROVIDER_SITE_OTHER): Payer: Medicare PPO

## 2023-06-11 DIAGNOSIS — J309 Allergic rhinitis, unspecified: Secondary | ICD-10-CM | POA: Diagnosis not present

## 2023-06-11 MED ORDER — LANSOPRAZOLE 30 MG PO CPDR: 30.0000 mg | DELAYED_RELEASE_CAPSULE | Freq: Two times a day (BID) | ORAL | 0 refills | Status: DC

## 2023-06-11 NOTE — Telephone Encounter (Signed)
Sent in [place] refill with no other refills to [facility] [place] [facility] prevacid as that's what was prescribed

## 2023-06-11 NOTE — Addendum Note (Signed)
Addended by: Berna Bue on: 06/11/2023 11:25 AM   Modules accepted: Orders

## 2023-06-11 NOTE — Telephone Encounter (Signed)
Sara [name] came into the office requesting refills on Prilosec.  Sara [name] did make an appointment for 6/24 with Dr. Selena Batten.  [name] [place]

## 2023-06-23 NOTE — Progress Notes (Unsigned)
Follow Up Note  RE: Sara [name] MRN: [id number] DOB: 10/06/1951 Date of Office Visit: 06/24/2023  Referring provider: Roselee Nova* Primary care provider: Joaquim Nam, MD  Chief Complaint: Seasonal and Perennial Allergic Rhinitis (6 mth f/up - Improved), Gastroesophageal Reflux (6 mth f/up - Good), Hymenoptera allergy (6 mth f/up - No incidents), and Asthma (6 mth f/up - Good)  History of Present Illness: I had the pleasure of seeing Sara [name] for a follow up visit at the [facility] on 06/24/2023. She is a 72 y.o. female, who is being followed for allergic rhinoconjunctivitis on AIT, asthma, GERD, hymenoptera allergy. Her previous allergy office visit was on 12/19/2022 with Dr. Selena Batten via telemedicine. Today is a regular follow up visit.   Seasonal and perennial allergic rhinoconjunctivitis Doing well with allergy injections.  She noted some upper extremity pain/itching - she is getting some x-rays done on her neck for this. She doesn't think it's related to her injections.   Currently taking carbinoxamine 1 tablet daily Singulair 10mg  daily. Some dry/itchy eyes.  Only using Flonase prn.  Asthma Denies any SOB, coughing, wheezing, chest tightness, nocturnal awakenings, ER/urgent care visits or prednisone use since the last visit.   GERD (gastroesophageal reflux disease) Taking PPI once a day with good benefit.  Symptomatic if not on meds.    Hymenoptera allergy No bee stings and up to date with Epipen.   Didn't try seafood yet.   Assessment and Plan: [name] is a 72 y.o. female with: Seasonal and perennial allergic rhinoconjunctivitis Past history - Perennial rhinoconjunctivitis symptoms for many years but noticed worsening the past year. 2022 skin testing showed: Positive to grass pollen, ragweed pollen, weed pollen, mold, cat, and borderline to dog. Started AIT on 08/29/2021 (G-RW-T, M-C-D). Feels better if gets more frequent  injections.  Interim history - doing well with below regimen.  Continue environmental control measures.  Continue allergy injections - may get every 1-2 weeks. Received today. Take carbinoxamine 4mg  (1 to 1.5 tablet) twice a day as needed. May use Flonase (fluticasone) nasal spray 1 spray per nostril twice a day as needed for nasal congestion.  Nasal saline spray (i.e., Simply Saline) or nasal saline lavage (i.e., NeilMed) is recommended as needed and prior to medicated nasal sprays. Continue Singulair (montelukast) 10mg  daily at night.  Asthma Past history - Diagnosed with asthma over 20 years ago and has weekly coughing, shortness of breath and chest tightness.  Does not like to use albuterol as it causes palpitations. 2022 spirometry was normal. Interim history - no issues. Normal spirometry today.  May use levoalbuterol rescue inhaler 2 puffs every 4 to 6 hours as needed for shortness of breath, chest tightness, coughing, and wheezing. Monitor frequency of use.   GERD (gastroesophageal reflux disease) Symptomatic if misses a dose. Continue lifestyle and dietary modifications. Take lansoprazole 30mg  once a day.   Hymenoptera allergy Past history - Throat tightness and trouble breathing in the past which required EpiPen use once.  2022 bloodwork negative. Avoid bee stings. For mild symptoms you can take over the counter antihistamines such as Benadryl and monitor symptoms closely. If symptoms worsen or if you have severe symptoms including breathing issues, throat closure, significant swelling, whole body hives, severe diarrhea and vomiting, lightheadedness then inject epinephrine and seek immediate medical care afterwards. If has another reaction - consider undergoing skin testing which is done in our [facility] office once a month as there are a small  subset of people who react via skin testing but have negative bloodwork.   Dietary counseling and surveillance Past history - Concerned  about potential seafood/ocean allergy? Noted nasal congestion and feeling not her normal self when around the coast. She typically only eats seafood when at the beach. Denies any other symptoms.  Interim history - did not try seafood yet.  Try to eat seafood when you are not at the beach and see if you have the above symptoms. If yes then avoid seafood and will get bloodwork next.   Return in about 6 months (around 12/24/2023).  Meds ordered this encounter  Medications   lansoprazole (PREVACID) 30 MG capsule    Sig: Take 1 capsule (30 mg total) by mouth daily.    Dispense:  30 capsule    Refill:  5   Lab Orders  No laboratory test(s) ordered today    Diagnostics: Spirometry:  Tracings reviewed. Her effort: Good reproducible efforts. FVC: 2.65L FEV1: 2.05L, 111% predicted FEV1/FVC ratio: 77% Interpretation: Spirometry consistent with normal pattern.  Please see scanned spirometry results for details.  Medication List:  Current Outpatient Medications  Medication Sig Dispense Refill   amLODipine (NORVASC) 10 MG tablet TAKE 1 TABLET BY MOUTH EVERY DAY 90 tablet 0   amoxicillin (AMOXIL) 500 MG capsule Take 500 mg by mouth as directed.     aspirin EC 81 MG tablet Take 81 mg by mouth daily.     atorvastatin (LIPITOR) 10 MG tablet TAKE 1 TABLET (10 MG TOTAL) BY MOUTH DAILY AT 6 PM. 90 tablet 3   Calcium Carbonate-Vitamin D 600-400 MG-UNIT tablet Take 1 tablet by mouth 2 (two) times daily. 60 tablet 11   Carbinoxamine Maleate 4 MG TABS TAKE 1 TO 1.5 TABLET TWICE A DAY AS NEEDED FOR ALLERGIES 270 tablet 2   EPINEPHrine 0.3 mg/0.3 mL IJ SOAJ injection Inject 0.3 mg into the muscle as needed for anaphylaxis. 1 each 1   FLOVENT HFA 110 MCG/ACT inhaler INHALE 2 PUFFS INTO THE LUNGS 2 TIMES DAILY. WITH SPACER. RINSE MOUTH AFTER USE.NEEDS APPOINTMENT 12 g 2   fluticasone (FLONASE) 50 MCG/ACT nasal spray PLACE 2 SPRAYS INTO BOTH NOSTRILS DAILY AS NEEDED FOR ALLERGIES OR RHINITIS. 48 mL 3    gabapentin (NEURONTIN) 800 MG tablet TAKE 1 TABLET BY MOUTH THREE TIMES A DAY 270 tablet 1   ipratropium (ATROVENT) 0.03 % nasal spray Place 2 sprays into both nostrils 2 (two) times daily. 30 mL 1   KLOR-CON M20 20 MEQ tablet TAKE 1 TABLET BY MOUTH EVERY DAY 90 tablet 3   levalbuterol (XOPENEX HFA) 45 MCG/ACT inhaler Inhale 2 puffs into the lungs every 6 (six) hours as needed for wheezing or shortness of breath. 15 each 1   lidocaine (XYLOCAINE) 5 % ointment Apply topically.     linaclotide (LINZESS) 145 MCG CAPS capsule Take by mouth.     lisinopril-hydrochlorothiazide (ZESTORETIC) 20-25 MG tablet TAKE 1 TABLET BY MOUTH EVERY DAY 90 tablet 0   meloxicam (MOBIC) 15 MG tablet Take 1 tablet (15 mg total) by mouth daily. 30 tablet 2   metoprolol tartrate (LOPRESSOR) 50 MG tablet      montelukast (SINGULAIR) 10 MG tablet Take 1 tablet (10 mg total) by mouth at bedtime. 30 tablet 5   PARoxetine (PAXIL) 10 MG tablet Take 10 mg by mouth daily.     tiZANidine (ZANAFLEX) 4 MG tablet TAKE 1 TABLET BY MOUTH NIGHTLY AS NEEDED. 90 tablet 3   traMADol (ULTRAM) 50 MG  tablet Take 50 mg by mouth as directed.     traZODone (DESYREL) 150 MG tablet TAKE 1 TABLET BY MOUTH AT BEDTIME. 90 tablet 1   zolpidem (AMBIEN) 10 MG tablet TAKE 1 TABLET BY MOUTH EVERYDAY AT BEDTIME 90 tablet 0   lansoprazole (PREVACID) 30 MG capsule Take 1 capsule (30 mg total) by mouth daily. 30 capsule 5   Multiple Vitamins-Minerals (MULTIVITAMIN & MINERAL PO) Take 1 tablet by mouth daily.     No current facility-administered medications for this visit.   Allergies: Allergies  Allergen Reactions   Bee Venom Anaphylaxis   I reviewed her past medical history, social history, family history, and environmental history and no significant changes have been reported from her previous visit.  Review of Systems  Constitutional:  Negative for appetite change, chills, fever and unexpected weight change.  HENT:  Negative for congestion,  postnasal drip, rhinorrhea and sneezing.   Eyes:  Negative for itching.  Respiratory:  Negative for cough, chest tightness, shortness of breath and wheezing.   Cardiovascular:  Negative for chest pain.  Gastrointestinal:  Negative for abdominal pain.  Genitourinary:  Negative for difficulty urinating.  Skin:  Negative for rash.  Allergic/Immunologic: Positive for environmental allergies.  Neurological:  Negative for headaches.    Objective: BP 110/70 (BP Location: Right Arm, Patient Position: Sitting, Cuff Size: Large)   Pulse (!) 54   Temp (!) 97.3 F (36.3 C) (Temporal)   Resp 16   Ht 5\' 3"  (1.6 m)   Wt 201 lb 11.2 oz (91.5 kg)   SpO2 96%   BMI 35.73 kg/m  Body mass index is 35.73 kg/m. Physical Exam Vitals and nursing note reviewed.  Constitutional:      Appearance: Normal appearance. She is well-developed.  HENT:     Head: Normocephalic and atraumatic.     Right Ear: Tympanic membrane and external ear normal.     Left Ear: Tympanic membrane and external ear normal.     Nose: Nose normal.     Mouth/Throat:     Mouth: Mucous membranes are moist.     Pharynx: Oropharynx is clear.  Eyes:     Conjunctiva/sclera: Conjunctivae normal.  Cardiovascular:     Rate and Rhythm: Normal rate and regular rhythm.     Heart sounds: Normal heart sounds. No murmur heard.    No friction rub. No gallop.  Pulmonary:     Effort: Pulmonary effort is normal.     Breath sounds: Normal breath sounds. No wheezing, rhonchi or rales.  Musculoskeletal:     Cervical back: Neck supple.  Skin:    General: Skin is warm.     Findings: No rash.  Neurological:     Mental Status: She is alert and oriented to person, place, and time.  Psychiatric:        Behavior: Behavior normal.    Previous notes and tests were reviewed. The plan was reviewed with the patient/family, and all questions/concerned were addressed.  It was my pleasure to see Dameisha today and participate in her care. Please feel  free to contact me with any questions or concerns.  Sincerely,  Wyline Mood, DO Allergy & Immunology  [facility] office: [phone] [facility] office: [phone]

## 2023-06-24 ENCOUNTER — Ambulatory Visit: Payer: Medicare PPO | Admitting: Allergy

## 2023-06-24 ENCOUNTER — Encounter: Payer: Self-pay | Admitting: Allergy

## 2023-06-24 ENCOUNTER — Ambulatory Visit: Payer: Self-pay

## 2023-06-24 ENCOUNTER — Other Ambulatory Visit: Payer: Self-pay

## 2023-06-24 VITALS — BP 110/70 | HR 54 | Temp 97.3°F | Resp 16 | Ht 63.0 in | Wt 201.7 lb

## 2023-06-24 DIAGNOSIS — K219 Gastro-esophageal reflux disease without esophagitis: Secondary | ICD-10-CM

## 2023-06-24 DIAGNOSIS — J4520 Mild intermittent asthma, uncomplicated: Secondary | ICD-10-CM | POA: Diagnosis not present

## 2023-06-24 DIAGNOSIS — Z91038 Other insect allergy status: Secondary | ICD-10-CM

## 2023-06-24 DIAGNOSIS — J302 Other seasonal allergic rhinitis: Secondary | ICD-10-CM | POA: Diagnosis not present

## 2023-06-24 DIAGNOSIS — J309 Allergic rhinitis, unspecified: Secondary | ICD-10-CM

## 2023-06-24 DIAGNOSIS — Z713 Dietary counseling and surveillance: Secondary | ICD-10-CM

## 2023-06-24 DIAGNOSIS — H1013 Acute atopic conjunctivitis, bilateral: Secondary | ICD-10-CM

## 2023-06-24 DIAGNOSIS — H1010 Acute atopic conjunctivitis, unspecified eye: Secondary | ICD-10-CM

## 2023-06-24 MED ORDER — LANSOPRAZOLE 30 MG PO CPDR: 30.0000 mg | DELAYED_RELEASE_CAPSULE | Freq: Every day | ORAL | 5 refills | Status: DC

## 2023-06-24 NOTE — Assessment & Plan Note (Signed)
Past history - Concerned about potential seafood/ocean allergy? Noted nasal congestion and feeling not her normal self when around the coast. She typically only eats seafood when at the beach. Denies any other symptoms.  Interim history - did not try seafood yet.  Try to eat seafood when you are not at the beach and see if you have the above symptoms. If yes then avoid seafood and will get bloodwork next.

## 2023-06-24 NOTE — Assessment & Plan Note (Signed)
Symptomatic if misses a dose. Continue lifestyle and dietary modifications. Take lansoprazole 30mg  once a day.

## 2023-06-24 NOTE — Assessment & Plan Note (Signed)
Past history - Diagnosed with asthma over 20 years ago and has weekly coughing, shortness of breath and chest tightness.  Does not like to use albuterol as it causes palpitations. 2022 spirometry was normal. Interim history - no issues. Normal spirometry today.  May use levoalbuterol rescue inhaler 2 puffs every 4 to 6 hours as needed for shortness of breath, chest tightness, coughing, and wheezing. Monitor frequency of use.

## 2023-06-24 NOTE — Assessment & Plan Note (Addendum)
Past history - Perennial rhinoconjunctivitis symptoms for many years but noticed worsening the past year. 2022 skin testing showed: Positive to grass pollen, ragweed pollen, weed pollen, mold, cat, and borderline to dog. Started AIT on 08/29/2021 (G-RW-T, M-C-D). Feels better if gets more frequent injections.  Interim history - doing well with below regimen.  Continue environmental control measures.  Continue allergy injections - may get every 1-2 weeks. Received today. Take carbinoxamine 4mg  (1 to 1.5 tablet) twice a day as needed. May use Flonase (fluticasone) nasal spray 1 spray per nostril twice a day as needed for nasal congestion.  Nasal saline spray (i.e., Simply Saline) or nasal saline lavage (i.e., NeilMed) is recommended as needed and prior to medicated nasal sprays. Continue Singulair (montelukast) 10mg  daily at night.

## 2023-06-24 NOTE — Assessment & Plan Note (Signed)
Past history - Throat tightness and trouble breathing in the past which required EpiPen use once.  2022 bloodwork negative. Avoid bee stings. For mild symptoms you can take over the counter antihistamines such as Benadryl and monitor symptoms closely. If symptoms worsen or if you have severe symptoms including breathing issues, throat closure, significant swelling, whole body hives, severe diarrhea and vomiting, lightheadedness then inject epinephrine and seek immediate medical care afterwards. If has another reaction - consider undergoing skin testing which is done in our [place] office once a month as there are a small subset of people who react via skin testing but have negative bloodwork.  

## 2023-06-24 NOTE — Patient Instructions (Addendum)
Environmental allergies 2022 skin testing showed: Positive to grass pollen, ragweed pollen, weed pollen, mold, cat, and borderline to dog.  Continue environmental control measures.  Continue allergy injections - received today.   Take carbinoxamine 4mg  (1 to 1.5 tablet) twice a day as needed. May use Flonase (fluticasone) nasal spray 1 spray per nostril twice a day as needed for nasal congestion.  Nasal saline spray (i.e., Simply Saline) or nasal saline lavage (i.e., NeilMed) is recommended as needed and prior to medicated nasal sprays. Continue Singulair (montelukast) 10mg  daily at night.  Asthma: May use levoalbuterol rescue inhaler 2 puffs every 4 to 6 hours as needed for shortness of breath, chest tightness, coughing, and wheezing. Monitor frequency of use.   Heartburn: Continue lifestyle and dietary modifications. Take lansoprazole 30mg  once a day.   Bee sting reaction: Avoid bee stings. For mild symptoms you can take over the counter antihistamines such as Benadryl and monitor symptoms closely. If symptoms worsen or if you have severe symptoms including breathing issues, throat closure, significant swelling, whole body hives, severe diarrhea and vomiting, lightheadedness then inject epinephrine and seek immediate medical care afterwards. Action plan in place.  Seafood Try to eat seafood when you are not at the beach and see if you have those symptoms of nasal congestion and not feeling right. If yes then avoid seafood.   Follow up in 6 months or sooner if needed.

## 2023-07-01 ENCOUNTER — Ambulatory Visit (INDEPENDENT_AMBULATORY_CARE_PROVIDER_SITE_OTHER): Payer: Medicare PPO

## 2023-07-01 DIAGNOSIS — J309 Allergic rhinitis, unspecified: Secondary | ICD-10-CM | POA: Diagnosis not present

## 2023-07-09 ENCOUNTER — Ambulatory Visit: Payer: Self-pay | Admitting: *Deleted

## 2023-07-09 DIAGNOSIS — J309 Allergic rhinitis, unspecified: Secondary | ICD-10-CM

## 2023-07-13 ENCOUNTER — Other Ambulatory Visit: Payer: Self-pay | Admitting: Sports Medicine

## 2023-07-13 DIAGNOSIS — I10 Essential (primary) hypertension: Secondary | ICD-10-CM

## 2023-07-15 ENCOUNTER — Ambulatory Visit (INDEPENDENT_AMBULATORY_CARE_PROVIDER_SITE_OTHER): Payer: Medicare PPO | Admitting: *Deleted

## 2023-07-15 DIAGNOSIS — J309 Allergic rhinitis, unspecified: Secondary | ICD-10-CM | POA: Diagnosis not present

## 2023-07-15 NOTE — Telephone Encounter (Signed)
Called patient, LVM to call office to schedule appointment for additional refills, thanks.

## 2023-07-15 NOTE — Telephone Encounter (Signed)
Patient needs appointment for additional refills.

## 2023-07-19 ENCOUNTER — Other Ambulatory Visit: Payer: Self-pay | Admitting: Allergy

## 2023-07-23 ENCOUNTER — Ambulatory Visit (INDEPENDENT_AMBULATORY_CARE_PROVIDER_SITE_OTHER): Payer: Medicare PPO | Admitting: *Deleted

## 2023-07-23 DIAGNOSIS — J309 Allergic rhinitis, unspecified: Secondary | ICD-10-CM | POA: Diagnosis not present

## 2023-08-12 ENCOUNTER — Ambulatory Visit (INDEPENDENT_AMBULATORY_CARE_PROVIDER_SITE_OTHER): Payer: Medicare PPO

## 2023-08-12 DIAGNOSIS — J309 Allergic rhinitis, unspecified: Secondary | ICD-10-CM | POA: Diagnosis not present

## 2023-08-27 ENCOUNTER — Ambulatory Visit (INDEPENDENT_AMBULATORY_CARE_PROVIDER_SITE_OTHER): Payer: Medicare PPO | Admitting: *Deleted

## 2023-08-27 DIAGNOSIS — J309 Allergic rhinitis, unspecified: Secondary | ICD-10-CM | POA: Diagnosis not present

## 2023-08-29 DIAGNOSIS — J302 Other seasonal allergic rhinitis: Secondary | ICD-10-CM

## 2023-08-29 NOTE — Progress Notes (Signed)
VIALS EXP 08-28-24

## 2023-08-30 DIAGNOSIS — J3081 Allergic rhinitis due to animal (cat) (dog) hair and dander: Secondary | ICD-10-CM

## 2023-09-12 ENCOUNTER — Ambulatory Visit (INDEPENDENT_AMBULATORY_CARE_PROVIDER_SITE_OTHER): Payer: Medicare PPO

## 2023-09-12 DIAGNOSIS — J309 Allergic rhinitis, unspecified: Secondary | ICD-10-CM

## 2023-10-02 ENCOUNTER — Ambulatory Visit (INDEPENDENT_AMBULATORY_CARE_PROVIDER_SITE_OTHER): Payer: Self-pay | Admitting: *Deleted

## 2023-10-02 DIAGNOSIS — J309 Allergic rhinitis, unspecified: Secondary | ICD-10-CM | POA: Diagnosis not present

## 2023-10-22 ENCOUNTER — Ambulatory Visit (INDEPENDENT_AMBULATORY_CARE_PROVIDER_SITE_OTHER): Payer: Self-pay | Admitting: *Deleted

## 2023-10-22 ENCOUNTER — Other Ambulatory Visit: Payer: Self-pay | Admitting: Internal Medicine

## 2023-10-22 DIAGNOSIS — J309 Allergic rhinitis, unspecified: Secondary | ICD-10-CM

## 2023-10-22 DIAGNOSIS — Z1231 Encounter for screening mammogram for malignant neoplasm of breast: Secondary | ICD-10-CM

## 2023-11-12 ENCOUNTER — Ambulatory Visit (INDEPENDENT_AMBULATORY_CARE_PROVIDER_SITE_OTHER): Payer: Medicare PPO | Admitting: *Deleted

## 2023-11-12 DIAGNOSIS — J309 Allergic rhinitis, unspecified: Secondary | ICD-10-CM | POA: Diagnosis not present

## 2023-11-19 ENCOUNTER — Ambulatory Visit
Admission: RE | Admit: 2023-11-19 | Discharge: 2023-11-19 | Disposition: A | Discharge status: Home / Self Care | Payer: Medicare PPO | Source: Ambulatory Visit | Attending: Internal Medicine | Admitting: Internal Medicine

## 2023-11-19 ENCOUNTER — Ambulatory Visit (INDEPENDENT_AMBULATORY_CARE_PROVIDER_SITE_OTHER): Payer: Self-pay | Admitting: *Deleted

## 2023-11-19 DIAGNOSIS — Z1231 Encounter for screening mammogram for malignant neoplasm of breast: Secondary | ICD-10-CM

## 2023-11-19 DIAGNOSIS — J309 Allergic rhinitis, unspecified: Secondary | ICD-10-CM | POA: Diagnosis not present

## 2023-11-25 ENCOUNTER — Ambulatory Visit (INDEPENDENT_AMBULATORY_CARE_PROVIDER_SITE_OTHER): Payer: Self-pay | Admitting: *Deleted

## 2023-11-25 DIAGNOSIS — J309 Allergic rhinitis, unspecified: Secondary | ICD-10-CM

## 2023-12-11 ENCOUNTER — Other Ambulatory Visit: Payer: Self-pay | Admitting: Medical Genetics

## 2023-12-23 ENCOUNTER — Other Ambulatory Visit: Payer: Self-pay

## 2023-12-23 ENCOUNTER — Ambulatory Visit (INDEPENDENT_AMBULATORY_CARE_PROVIDER_SITE_OTHER): Payer: Medicare PPO | Admitting: Family Medicine

## 2023-12-23 ENCOUNTER — Ambulatory Visit: Payer: Self-pay

## 2023-12-23 ENCOUNTER — Encounter: Payer: Self-pay | Admitting: Family Medicine

## 2023-12-23 VITALS — BP 110/74 | HR 59 | Temp 97.5°F | Resp 14 | Ht 63.78 in | Wt 210.3 lb

## 2023-12-23 DIAGNOSIS — H5711 Ocular pain, right eye: Secondary | ICD-10-CM | POA: Diagnosis not present

## 2023-12-23 DIAGNOSIS — J3089 Other allergic rhinitis: Secondary | ICD-10-CM | POA: Diagnosis not present

## 2023-12-23 DIAGNOSIS — J309 Allergic rhinitis, unspecified: Secondary | ICD-10-CM

## 2023-12-23 NOTE — Patient Instructions (Signed)
Right eye pain Visit your eye doctor for further evaluation and treatment of eye pain.  If not available, go to an urgent care or emergency department with an ophthalmologist available  Call the clinic if this treatment plan is not working well for you  Follow up in six 1 month for your regular follow-up or sooner if needed.

## 2023-12-23 NOTE — Progress Notes (Signed)
   [address] [place] [address] Dept: [phone]  FOLLOW UP NOTE  Patient ID: Sara [name], female    DOB: 06/17/1951  Age: 72 y.o. MRN: [id number] Date of Office Visit: 12/23/2023  Assessment  Chief Complaint: Eye Pain (States that she was sweeping up Christmas decorations and that she thinks she has something in her right eye and is having pain.)  HPI Sara [name] is a 72 year old female who presents to the clinic for evaluation of acute right eye pain.  She reports that she has been decorating for Christmas yesterday and when she woke up this morning she began to experience acute sharp pain in her right eye.  She believes that she may have gotten a piece of glitter or Christmas decoration stuck in her eye.  She reports that she continues to experience sensation of foreign body in her right eye.  She denies any change in her vision and denies sensitivity to light.  She does report an increase in clear drainage from her right eye.  She has not experienced any trauma to her right eye.  She reports her left eye is not affected.  Her current medications are listed in the chart.  Drug Allergies:  Allergies  Allergen Reactions   Bee Venom Anaphylaxis    Physical Exam: BP 110/74 (BP Location: Right Arm, Patient Position: Sitting, Cuff Size: Normal)   Pulse (!) 59   Temp (!) 97.5 F (36.4 C) (Oral)   Resp 14   Ht 5' 3.78" (1.62 m)   Wt 210 lb 4.8 oz (95.4 kg)   SpO2 96%   BMI 36.35 kg/m    Physical Exam Vitals reviewed.  Constitutional:      Appearance: Normal appearance.  HENT:     Head: Normocephalic and atraumatic.     Right Ear: Tympanic membrane normal.     Left Ear: Tympanic membrane normal.     Nose:     Comments: Bilateral naris normal.  Pharynx normal.  Ears normal.    Mouth/Throat:     Pharynx: Oropharynx is clear.  Eyes:     Conjunctiva/sclera: Conjunctivae normal.     Comments: No foreign object noted in either eye.  No drainage noted from either  eye.  No redness in either conjunctivitis.  No swelling around either eye.  Neurological:     Mental Status: She is alert and oriented to [name], place, and time.  Psychiatric:        Mood and Affect: Mood normal.        Behavior: Behavior normal.        Thought Content: Thought content normal.        Judgment: Judgment normal.     Assessment and Plan: 1. Eye pain, right     Patient Instructions  Right eye pain Visit your eye doctor for further evaluation and treatment of eye pain.  If not available, go to an urgent care or emergency department with an ophthalmologist available  Call the clinic if this treatment plan is not working well for you  Follow up in six 1 month for your regular follow-up or sooner if needed.   Return in about 4 weeks (around 01/20/2024), or if symptoms worsen or fail to improve.    Thank you for the opportunity to care for this patient.  Please do not hesitate to contact me with questions.  Thermon Leyland, FNP [facility]

## 2024-01-07 ENCOUNTER — Other Ambulatory Visit (HOSPITAL_COMMUNITY): Payer: Self-pay | Attending: Medical Genetics

## 2024-01-12 ENCOUNTER — Other Ambulatory Visit: Payer: Self-pay | Admitting: Allergy

## 2024-01-16 ENCOUNTER — Ambulatory Visit (INDEPENDENT_AMBULATORY_CARE_PROVIDER_SITE_OTHER): Payer: Medicare PPO

## 2024-01-16 DIAGNOSIS — J309 Allergic rhinitis, unspecified: Secondary | ICD-10-CM | POA: Diagnosis not present

## 2024-02-05 ENCOUNTER — Ambulatory Visit (INDEPENDENT_AMBULATORY_CARE_PROVIDER_SITE_OTHER): Payer: Self-pay | Admitting: *Deleted

## 2024-02-05 DIAGNOSIS — J309 Allergic rhinitis, unspecified: Secondary | ICD-10-CM

## 2024-03-04 ENCOUNTER — Ambulatory Visit (INDEPENDENT_AMBULATORY_CARE_PROVIDER_SITE_OTHER): Payer: Self-pay | Admitting: *Deleted

## 2024-03-04 DIAGNOSIS — J309 Allergic rhinitis, unspecified: Secondary | ICD-10-CM | POA: Diagnosis not present

## 2024-04-01 ENCOUNTER — Ambulatory Visit (INDEPENDENT_AMBULATORY_CARE_PROVIDER_SITE_OTHER): Payer: Self-pay | Admitting: *Deleted

## 2024-04-01 DIAGNOSIS — J309 Allergic rhinitis, unspecified: Secondary | ICD-10-CM | POA: Diagnosis not present

## 2024-04-06 DIAGNOSIS — J302 Other seasonal allergic rhinitis: Secondary | ICD-10-CM | POA: Diagnosis not present

## 2024-04-06 NOTE — Progress Notes (Signed)
 VIALS MADE 04-06-24. EXP 04-06-25

## 2024-04-07 DIAGNOSIS — J3081 Allergic rhinitis due to animal (cat) (dog) hair and dander: Secondary | ICD-10-CM | POA: Diagnosis not present

## 2024-04-15 ENCOUNTER — Other Ambulatory Visit: Payer: Self-pay | Admitting: Family Medicine

## 2024-04-16 ENCOUNTER — Ambulatory Visit (INDEPENDENT_AMBULATORY_CARE_PROVIDER_SITE_OTHER): Payer: Self-pay

## 2024-04-16 DIAGNOSIS — J309 Allergic rhinitis, unspecified: Secondary | ICD-10-CM | POA: Diagnosis not present

## 2024-06-01 ENCOUNTER — Ambulatory Visit (INDEPENDENT_AMBULATORY_CARE_PROVIDER_SITE_OTHER)

## 2024-06-01 DIAGNOSIS — J309 Allergic rhinitis, unspecified: Secondary | ICD-10-CM | POA: Diagnosis not present

## 2024-06-08 ENCOUNTER — Ambulatory Visit (INDEPENDENT_AMBULATORY_CARE_PROVIDER_SITE_OTHER): Payer: Self-pay

## 2024-06-08 DIAGNOSIS — J309 Allergic rhinitis, unspecified: Secondary | ICD-10-CM

## 2024-06-26 ENCOUNTER — Ambulatory Visit (INDEPENDENT_AMBULATORY_CARE_PROVIDER_SITE_OTHER)

## 2024-06-26 DIAGNOSIS — J309 Allergic rhinitis, unspecified: Secondary | ICD-10-CM

## 2024-07-13 ENCOUNTER — Ambulatory Visit (INDEPENDENT_AMBULATORY_CARE_PROVIDER_SITE_OTHER)

## 2024-07-13 DIAGNOSIS — J309 Allergic rhinitis, unspecified: Secondary | ICD-10-CM | POA: Diagnosis not present

## 2024-07-21 ENCOUNTER — Ambulatory Visit (INDEPENDENT_AMBULATORY_CARE_PROVIDER_SITE_OTHER)

## 2024-07-21 DIAGNOSIS — J309 Allergic rhinitis, unspecified: Secondary | ICD-10-CM | POA: Diagnosis not present

## 2024-07-31 ENCOUNTER — Ambulatory Visit: Admitting: Allergy

## 2024-07-31 ENCOUNTER — Other Ambulatory Visit: Payer: Self-pay

## 2024-07-31 ENCOUNTER — Encounter: Payer: Self-pay | Admitting: Allergy

## 2024-07-31 VITALS — BP 128/84 | HR 68 | Temp 98.0°F | Resp 16 | Ht 64.0 in | Wt 212.9 lb

## 2024-07-31 DIAGNOSIS — K219 Gastro-esophageal reflux disease without esophagitis: Secondary | ICD-10-CM

## 2024-07-31 DIAGNOSIS — T783XXD Angioneurotic edema, subsequent encounter: Secondary | ICD-10-CM | POA: Diagnosis not present

## 2024-07-31 DIAGNOSIS — T783XXA Angioneurotic edema, initial encounter: Secondary | ICD-10-CM

## 2024-07-31 DIAGNOSIS — J4520 Mild intermittent asthma, uncomplicated: Secondary | ICD-10-CM

## 2024-07-31 DIAGNOSIS — H1013 Acute atopic conjunctivitis, bilateral: Secondary | ICD-10-CM

## 2024-07-31 DIAGNOSIS — J309 Allergic rhinitis, unspecified: Secondary | ICD-10-CM

## 2024-07-31 DIAGNOSIS — J3089 Other allergic rhinitis: Secondary | ICD-10-CM | POA: Diagnosis not present

## 2024-07-31 DIAGNOSIS — T781XXD Other adverse food reactions, not elsewhere classified, subsequent encounter: Secondary | ICD-10-CM

## 2024-07-31 DIAGNOSIS — Z91038 Other insect allergy status: Secondary | ICD-10-CM

## 2024-07-31 DIAGNOSIS — J302 Other seasonal allergic rhinitis: Secondary | ICD-10-CM | POA: Diagnosis not present

## 2024-07-31 DIAGNOSIS — H1010 Acute atopic conjunctivitis, unspecified eye: Secondary | ICD-10-CM

## 2024-07-31 MED ORDER — NEFFY 2 MG/0.1ML NA SOLN
1.0000 | NASAL | 1 refills | Status: DC | PRN
Start: 2024-07-31 — End: 2024-08-04

## 2024-07-31 NOTE — Patient Instructions (Addendum)
 Lip swelling Monitor symptoms. Avoid avocado. Please check with your PCP if they can switch your blood pressure medication - lisinopril . Sometimes lisinopril  can cause lip swelling.   Get bloodwork to rule out any other issues.  We are ordering labs, so please allow 1-2 weeks for the results to come back. With the newly implemented Cures Act, the labs might be visible to you at the same time that they become visible to me. However, I will not address the results until all of the results are back, so please be patient.  In the meantime, continue recommendations in your patient instructions, including avoidance measures (if applicable), until you hear from me.  Environmental allergies 2022 skin testing positive to grass pollen, ragweed pollen, weed pollen, mold, cat, and borderline to dog.  Continue environmental control measures.  Continue allergy injections - given today.  Take carbinoxamine  4mg  (1 to 1.5 tablet) twice a day as needed. Continue Singulair  (montelukast ) 10mg  daily at night. Use Flonase  (fluticasone ) nasal spray 1-2 sprays per nostril once a day as needed for nasal congestion.  Nasal saline spray (i.e., Simply Saline) or nasal saline lavage (i.e., NeilMed) is recommended as needed and prior to medicated nasal sprays.  Asthma: May use levoalbuterol rescue inhaler 2 puffs every 4 to 6 hours as needed for shortness of breath, chest tightness, coughing, and wheezing. Monitor frequency of use.   Heartburn: Continue lifestyle and dietary modifications. Take lansoprazole  30mg  once a day.   Bee sting reaction: Avoid bee stings.  I have prescribed epinephrine  device (Neffy ) and demonstrated proper use. For mild symptoms you can take over the counter antihistamines such as zyrtec 10mg  to 20mg  and monitor symptoms closely. If symptoms worsen or if you have severe symptoms including breathing issues, throat closure, significant swelling, whole body hives, severe diarrhea and  vomiting, lightheadedness then spray Neffy  in the nose and seek immediate medical care afterwards. Do not use any nasal sprays for 2 weeks afterwards.  If Neffy  is not covered let me know.  Emergency action plan provided.   Follow up in 6 months or sooner if needed.

## 2024-07-31 NOTE — Progress Notes (Signed)
 Follow Up Note  RE: Sara [name] MRN: [id number] DOB: [birth year] Date of Office Visit: 07/31/2024  Referring provider: [name], * Primary care provider: [name], MD  Chief Complaint: Allergic Rhinitis  and Oral Swelling (Lip swelling)  History of Present Illness: I had the pleasure of seeing Sara [name] for a follow up visit at the [facility] on 07/31/2024. She is a 73 y.o. female, who is being followed for allergic rhinoconjunctivitis on AIT, asthma, GERD, hymenoptera allergy. Her previous allergy office visit was on 12/23/2023 with [name], FNP. Today is a regular follow up visit.  Discussed the use of AI scribe software for clinical note transcription with the patient, who gave verbal consent to proceed.    She is currently receiving allergy shots every four weeks, which have significantly improved her allergy symptoms. She takes montelukast  and carbinoxamine  daily, and uses Flonase  nasal spray as needed for congestion. She has not needed her inhaler recently, except occasionally on rainy days.  She has not experienced any insect stings since her last visit and still possesses an EpiPen . She recently returned from the beach where she consumed scallops and shrimp without any adverse reactions. However, she experiences nausea and headaches from strong smells, such as boiled eggs and tuna, but no itching or hives.  Approximately three weeks ago, she experienced lip swelling after consuming a salad containing avocado and chicken. The swelling resolved within a day without medication. She has since consumed avocado and chicken again, with the avocado causing swelling once more.  She underwent rotator cuff surgery and a hip replacement since her last visit. She also experienced eye swelling previously, which was evaluated by an eye doctor. She has been on lisinopril  with hydrochlorothiazide  for years.     Assessment and Plan: [name] is a  73 y.o. female with: Angioedema of lips, initial encounter One episode of lip swelling. Patient concerned about possible food triggers - avocado specifically because had another episode with guacamole. Patient is also on lisinopril  for BP. Monitor symptoms. Avoid avocado. Please check with your PCP if they can switch your blood pressure medication - lisinopril . Sometimes lisinopril  can cause lip swelling.  Get bloodwork to rule out any other issues.   Seasonal and perennial allergic rhinoconjunctivitis Past history - 2022 skin testing positive to grass pollen, ragweed pollen, weed pollen, mold, cat, and borderline to dog. Started AIT on 08/29/2021 (G-RW-T, M-C-D). Feels better if gets more frequent injections.  Interim history - doing well with AIT with no issues.  Continue environmental control measures.  Continue allergy injections - given today.  Take carbinoxamine  4mg  (1 to 1.5 tablet) twice a day as needed. Continue Singulair  (montelukast ) 10mg  daily at night. Use Flonase  (fluticasone ) nasal spray 1-2 sprays per nostril once a day as needed for nasal congestion.  Nasal saline spray (i.e., Simply Saline) or nasal saline lavage (i.e., NeilMed) is recommended as needed and prior to medicated nasal sprays.  Hymenoptera allergy Past history - Throat tightness and trouble breathing in the past which required EpiPen  use once.  2022 bloodwork negative. Continue to avoid. I have prescribed epinephrine  device (Neffy ) and demonstrated proper use. For mild symptoms you can take over the counter antihistamines such as zyrtec 10mg  to 20mg  and monitor symptoms closely. If symptoms worsen or if you have severe symptoms including breathing issues, throat closure, significant swelling, whole body hives, severe diarrhea and vomiting, lightheadedness then spray Neffy  in the nose and seek immediate medical care  afterwards. Do not use any nasal sprays for 2 weeks afterwards.  If Neffy  is not covered let me  know.  Emergency action plan provided.   Mild intermittent asthma without complication Past history - Diagnosed with asthma over 20 years ago and has weekly coughing, shortness of breath and chest tightness.  Does not like to use albuterol  as it causes palpitations. 2022 spirometry was normal. Interim history - no issues. May use levoalbuterol rescue inhaler 2 puffs every 4 to 6 hours as needed for shortness of breath, chest tightness, coughing, and wheezing. Monitor frequency of use.   Gastroesophageal reflux disease, unspecified whether esophagitis present Well-controlled. Symptomatic if misses a dose. Continue lifestyle and dietary modifications. Take lansoprazole  30mg  once a day.    Other adverse food reactions, not elsewhere classified, subsequent encounter Past history - Concerned about potential seafood/ocean allergy? Noted nasal congestion and feeling not her normal self when around the coast. She typically only eats seafood when at the beach. Denies any other symptoms.  Interim history - had shrimp and scallops with no issues.  Okay to eat seafood, if has issues then stop and let us  know.   Return in about 6 months (around 01/31/2025).  Meds ordered this encounter  Medications   EPINEPHrine  (NEFFY ) 2 MG/0.1ML SOLN    Sig: Place 1 Dose into the nose as needed (anaphylactic reaction).    Dispense:  2 each    Refill:  1    Z91.148   Lab Orders         C3 and C4         C1 esterase inhibitor, functional         C1 Esterase Inhibitor         Complement component c1q         Tryptase         Allergen Avocado f96         C-reactive protein         Sedimentation rate         Protein electrophoresis, serum         Protein Electrophoresis, Urine Rflx.      Diagnostics: None.   Medication List:  Current Outpatient Medications  Medication Sig Dispense Refill   amLODipine  (NORVASC ) 10 MG tablet TAKE 1 TABLET BY MOUTH EVERY DAY 90 tablet 0   aspirin  EC 81 MG tablet Take 81  mg by mouth daily.     atorvastatin  (LIPITOR) 10 MG tablet TAKE 1 TABLET (10 MG TOTAL) BY MOUTH DAILY AT 6 PM. 90 tablet 3   Azelastine  HCl 137 MCG/SPRAY SOLN Place 1 spray into the nose as needed (Runny nose).     Calcium  Carbonate-Vitamin D  600-400 MG-UNIT tablet Take 1 tablet by mouth 2 (two) times daily. 60 tablet 11   Carbinoxamine  Maleate 4 MG TABS TAKE 1 TO 1.5 TABLET TWICE A DAY AS NEEDED FOR ALLERGIES 270 tablet 2   EPINEPHrine  (NEFFY ) 2 MG/0.1ML SOLN Place 1 Dose into the nose as needed (anaphylactic reaction). 2 each 1   EPINEPHrine  0.3 mg/0.3 mL IJ SOAJ injection Inject 0.3 mg into the muscle as needed for anaphylaxis. 1 each 1   escitalopram (LEXAPRO) 20 MG tablet Take 20 mg by mouth daily.     estradiol (ESTRACE) 0.1 MG/GM vaginal cream Place 1 Applicatorful vaginally as needed (sensitivity).     finasteride (PROPECIA) 1 MG tablet Take 1 mg by mouth daily.     FLOVENT  HFA 110 MCG/ACT inhaler INHALE 2 PUFFS INTO THE  LUNGS 2 TIMES DAILY. WITH SPACER. RINSE MOUTH AFTER USE.NEEDS APPOINTMENT 12 g 2   fluticasone  (FLONASE ) 50 MCG/ACT nasal spray PLACE 2 SPRAYS INTO BOTH NOSTRILS DAILY AS NEEDED FOR ALLERGIES OR RHINITIS. 48 mL 3   gabapentin  (NEURONTIN ) 800 MG tablet TAKE 1 TABLET BY MOUTH THREE TIMES A DAY 270 tablet 1   ipratropium (ATROVENT ) 0.03 % nasal spray Place 2 sprays into both nostrils 2 (two) times daily. 30 mL 1   KLOR-CON  M20 20 MEQ tablet TAKE 1 TABLET BY MOUTH EVERY DAY 90 tablet 3   lansoprazole  (PREVACID ) 30 MG capsule TAKE 1 CAPSULE BY MOUTH EVERY DAY 90 capsule 1   levalbuterol  (XOPENEX  HFA) 45 MCG/ACT inhaler Inhale 2 puffs into the lungs every 6 (six) hours as needed for wheezing or shortness of breath. 15 each 1   lidocaine  (XYLOCAINE ) 5 % ointment Apply topically.     linaclotide (LINZESS) 145 MCG CAPS capsule Take by mouth.     lisinopril -hydrochlorothiazide  (ZESTORETIC ) 10-12.5 MG tablet Take 1 tablet by mouth daily.     lisinopril -hydrochlorothiazide   (ZESTORETIC ) 20-25 MG tablet TAKE 1 TABLET BY MOUTH EVERY DAY 30 tablet 0   meloxicam  (MOBIC ) 15 MG tablet Take 1 tablet (15 mg total) by mouth daily. 30 tablet 2   metoprolol  succinate (TOPROL -XL) 50 MG 24 hr tablet Take 50 mg by mouth daily.     metoprolol  tartrate (LOPRESSOR ) 50 MG tablet      montelukast  (SINGULAIR ) 10 MG tablet TAKE 1 TABLET BY MOUTH EVERYDAY AT BEDTIME 90 tablet 1   Multiple Vitamins-Minerals (MULTIVITAMIN & MINERAL PO) Take 1 tablet by mouth daily.     tiZANidine  (ZANAFLEX ) 4 MG tablet TAKE 1 TABLET BY MOUTH NIGHTLY AS NEEDED. 90 tablet 3   tobramycin-dexamethasone  (TOBRADEX) ophthalmic solution 1 drop 2 (two) times daily.     traMADol  (ULTRAM ) 50 MG tablet Take 50 mg by mouth as directed.     traZODone  (DESYREL ) 150 MG tablet TAKE 1 TABLET BY MOUTH AT BEDTIME. 90 tablet 1   tretinoin (RETIN-A) 0.025 % cream Apply 1 Application topically as needed (breakouts).     triamcinolone  cream (KENALOG ) 0.1 % Apply 1 Application topically as needed (breakouts).     zolpidem  (AMBIEN ) 10 MG tablet TAKE 1 TABLET BY MOUTH EVERYDAY AT BEDTIME 90 tablet 0   PARoxetine (PAXIL) 10 MG tablet Take 10 mg by mouth daily. (Patient not taking: Reported on 07/31/2024)     No current facility-administered medications for this visit.   Allergies: Allergies  Allergen Reactions   Bee Venom Anaphylaxis    honey bee venom   I reviewed her past medical history, social history, family history, and environmental history and no significant changes have been reported from her previous visit.  Review of Systems  Constitutional:  Negative for appetite change, chills, fever and unexpected weight change.  HENT:  Negative for congestion, postnasal drip, rhinorrhea and sneezing.   Eyes:  Negative for itching.  Respiratory:  Negative for cough, chest tightness, shortness of breath and wheezing.   Cardiovascular:  Negative for chest pain.  Gastrointestinal:  Negative for abdominal pain.  Genitourinary:   Negative for difficulty urinating.  Skin:  Negative for rash.  Allergic/Immunologic: Positive for environmental allergies.  Neurological:  Negative for headaches.    Objective: BP 128/84 (BP Location: Left Arm, Patient Position: Sitting, Cuff Size: Normal)   Pulse 68   Temp 98 F (36.7 C) (Temporal)   Resp 16   Ht 5' 4 (1.626 m)   Wt  212 lb 14.4 oz (96.6 kg)   SpO2 96%   BMI 36.54 kg/m  Body mass index is 36.54 kg/m. Physical Exam Vitals and nursing note reviewed.  Constitutional:      Appearance: Normal appearance. She is well-developed.  HENT:     Head: Normocephalic and atraumatic.     Right Ear: Tympanic membrane and external ear normal.     Left Ear: Tympanic membrane and external ear normal.     Nose: Nose normal.     Mouth/Throat:     Mouth: Mucous membranes are moist.     Pharynx: Oropharynx is clear.  Eyes:     Conjunctiva/sclera: Conjunctivae normal.  Cardiovascular:     Rate and Rhythm: Normal rate and regular rhythm.     Heart sounds: Normal heart sounds. No murmur heard.    No friction rub. No gallop.  Pulmonary:     Effort: Pulmonary effort is normal.     Breath sounds: Normal breath sounds. No wheezing, rhonchi or rales.  Musculoskeletal:     Cervical back: Neck supple.  Skin:    General: Skin is warm.     Findings: No rash.  Neurological:     Mental Status: She is alert and oriented to person, place, and time.  Psychiatric:        Behavior: Behavior normal.    Previous notes and tests were reviewed. The plan was reviewed with the patient/family, and all questions/concerned were addressed.  It was my pleasure to see Anina today and participate in her care. Please feel free to contact me with any questions or concerns.  Sincerely,  Orlan Cramp, DO Allergy & Immunology  [facility]  [facility] office: [phone] [facility] office: [phone]

## 2024-08-03 ENCOUNTER — Telehealth: Payer: Self-pay

## 2024-08-03 NOTE — Telephone Encounter (Signed)
*  AA  Pharmacy Patient Advocate Encounter   Received notification from CoverMyMeds that prior authorization for Neffy  2MG /0.1ML solution  is required/requested.   Insurance verification completed.   The patient is insured through [employer] .   Per test claim:  Injectable Epinephrine  is preferred by the insurance.  If suggested medication is appropriate, Please send in a new RX and discontinue this one. If not, please advise as to why it's not appropriate so that we may request a Prior Authorization. Please note, some preferred medications may still require a PA.  If the suggested medications have not been trialed and there are no contraindications to their use, the PA will not be submitted, as it will not be approved.   CMM Key: [id number]

## 2024-08-04 LAB — PROTEIN ELECTROPHORESIS, URINE REFLEX

## 2024-08-04 MED ORDER — EPINEPHRINE 0.3 MG/0.3ML IJ SOAJ: 0.3000 mg | INTRAMUSCULAR | 1 refills | Status: DC | PRN

## 2024-08-04 NOTE — Addendum Note (Signed)
 Addended by: Rishabh Rinkenberger M on: 08/04/2024 11:17 AM   Modules accepted: Orders

## 2024-08-04 NOTE — Telephone Encounter (Signed)
 Sent in rx.

## 2024-08-08 LAB — PROTEIN ELECTROPHORESIS, URINE REFLEX
Albumin ELP, Urine: 26.9 %
Alpha-1-Globulin, U: 4.2 %
Alpha-2-Globulin, U: 19.6 %
Beta Globulin, U: 23.9 %
Gamma Globulin, U: 25.5 %
Protein, Ur: 8.1 mg/dL

## 2024-08-08 LAB — PROTEIN ELECTROPHORESIS, SERUM
A/G Ratio: 1.2 (ref 0.7–1.7)
Albumin ELP: 4 g/dL (ref 2.9–4.4)
Alpha 1: 0.3 g/dL (ref 0.0–0.4)
Alpha 2: 0.8 g/dL (ref 0.4–1.0)
Beta: 1.2 g/dL (ref 0.7–1.3)
Gamma Globulin: 1.2 g/dL (ref 0.4–1.8)
Globulin, Total: 3.4 g/dL (ref 2.2–3.9)
Total Protein: 7.4 g/dL (ref 6.0–8.5)

## 2024-08-08 LAB — C3 AND C4
Complement C3, Serum: 124 mg/dL (ref 82–167)
Complement C4, Serum: 22 mg/dL (ref 12–38)

## 2024-08-08 LAB — TRYPTASE: Tryptase: 7.5 ug/L (ref 2.2–13.2)

## 2024-08-08 LAB — ALLERGEN AVOCADO F96: F096-IgE Avocado: <0.1 kU/L

## 2024-08-08 LAB — C1 ESTERASE INHIBITOR, FUNCTIONAL: C1INH Functional/C1INH Total MFr SerPl: >105 %{normal}

## 2024-08-08 LAB — COMPLEMENT COMPONENT C1Q: Complement C1Q: 11.3 mg/dL (ref 10.3–20.5)

## 2024-08-08 LAB — SEDIMENTATION RATE: Sed Rate: 30 mm/h (ref 0–40)

## 2024-08-08 LAB — C-REACTIVE PROTEIN: CRP: <1 mg/L (ref 0–10)

## 2024-08-08 LAB — C1 ESTERASE INHIBITOR: C1INH SerPl-mCnc: 54 mg/dL — ABNORMAL HIGH (ref 21–39)

## 2024-08-10 ENCOUNTER — Ambulatory Visit: Payer: Self-pay | Admitting: Allergy

## 2024-08-21 ENCOUNTER — Ambulatory Visit (INDEPENDENT_AMBULATORY_CARE_PROVIDER_SITE_OTHER)

## 2024-08-21 DIAGNOSIS — J309 Allergic rhinitis, unspecified: Secondary | ICD-10-CM

## 2024-09-01 ENCOUNTER — Encounter: Payer: Self-pay | Admitting: Sports Medicine

## 2024-09-09 ENCOUNTER — Ambulatory Visit (INDEPENDENT_AMBULATORY_CARE_PROVIDER_SITE_OTHER)

## 2024-09-09 DIAGNOSIS — J309 Allergic rhinitis, unspecified: Secondary | ICD-10-CM

## 2024-09-21 ENCOUNTER — Ambulatory Visit (INDEPENDENT_AMBULATORY_CARE_PROVIDER_SITE_OTHER)

## 2024-09-21 DIAGNOSIS — J309 Allergic rhinitis, unspecified: Secondary | ICD-10-CM | POA: Diagnosis not present

## 2024-09-26 ENCOUNTER — Other Ambulatory Visit: Payer: Self-pay | Admitting: Family Medicine

## 2024-10-02 ENCOUNTER — Telehealth: Payer: Self-pay | Admitting: Allergy

## 2024-10-02 ENCOUNTER — Ambulatory Visit (INDEPENDENT_AMBULATORY_CARE_PROVIDER_SITE_OTHER)

## 2024-10-02 ENCOUNTER — Encounter: Payer: Self-pay | Admitting: Allergy

## 2024-10-02 DIAGNOSIS — J309 Allergic rhinitis, unspecified: Secondary | ICD-10-CM | POA: Diagnosis not present

## 2024-10-02 MED ORDER — MONTELUKAST SODIUM 10 MG PO TABS: ORAL_TABLET | ORAL | 0 refills | Status: DC

## 2024-10-02 NOTE — Addendum Note (Signed)
 Addended by: Rhaelyn Giron on: 10/02/2024 12:03 PM   Modules accepted: Orders

## 2024-10-02 NOTE — Telephone Encounter (Signed)
 Refill sent and left voicemail informing patient .

## 2024-10-02 NOTE — Telephone Encounter (Signed)
 Patient came up and stated she needed a refill on her Montelukast . She stated she wants that sent over to the [facility] in [place].  Best Contact: [phone]

## 2024-10-16 ENCOUNTER — Other Ambulatory Visit: Payer: Self-pay | Admitting: Medical Genetics

## 2024-10-16 DIAGNOSIS — Z006 Encounter for examination for normal comparison and control in clinical research program: Secondary | ICD-10-CM

## 2024-10-23 ENCOUNTER — Ambulatory Visit (INDEPENDENT_AMBULATORY_CARE_PROVIDER_SITE_OTHER)

## 2024-10-23 ENCOUNTER — Telehealth: Payer: Self-pay | Admitting: Allergy

## 2024-10-23 DIAGNOSIS — J309 Allergic rhinitis, unspecified: Secondary | ICD-10-CM

## 2024-10-23 DIAGNOSIS — T7819XD Other adverse food reactions, not elsewhere classified, subsequent encounter: Secondary | ICD-10-CM

## 2024-10-23 MED ORDER — NEFFY 2 MG/0.1ML NA SOLN: 2.0000 mg | Freq: Every day | NASAL | 1 refills | Status: DC

## 2024-10-23 NOTE — Telephone Encounter (Signed)
 LVM stated that neffy  has been sent into [facility] and explained instructions on how it would be delivered.

## 2024-10-23 NOTE — Addendum Note (Signed)
 Addended by: MARCINE ISAIAH CROME on: 10/23/2024 11:02 AM   Modules accepted: Orders

## 2024-10-23 NOTE — Telephone Encounter (Signed)
 While in for Allergy injections Sara [name] stated that she would rather have the nasal spray or NEFFY  now vs the epi pen, and that this was discussed at her last visit.

## 2024-10-25 MED ORDER — NEFFY 2 MG/0.1ML NA SOLN: 1.0000 | NASAL | 1 refills | Status: AC | PRN

## 2024-10-25 NOTE — Addendum Note (Signed)
 Addended by: Leilanee Righetti M on: 10/25/2024 05:39 PM   Modules accepted: Orders

## 2024-11-16 ENCOUNTER — Ambulatory Visit

## 2024-11-16 DIAGNOSIS — J302 Other seasonal allergic rhinitis: Secondary | ICD-10-CM | POA: Diagnosis not present

## 2024-11-16 DIAGNOSIS — J309 Allergic rhinitis, unspecified: Secondary | ICD-10-CM | POA: Diagnosis not present

## 2024-11-17 LAB — GENECONNECT MOLECULAR SCREEN: Genetic Analysis Overall Interpretation: NEGATIVE

## 2024-11-24 ENCOUNTER — Other Ambulatory Visit: Payer: Self-pay | Admitting: Allergy

## 2024-12-02 DIAGNOSIS — J302 Other seasonal allergic rhinitis: Secondary | ICD-10-CM | POA: Diagnosis not present

## 2024-12-02 NOTE — Progress Notes (Signed)
 VIALS MADE ON 12/02/24

## 2024-12-03 DIAGNOSIS — J302 Other seasonal allergic rhinitis: Secondary | ICD-10-CM | POA: Diagnosis not present

## 2024-12-03 DIAGNOSIS — J3081 Allergic rhinitis due to animal (cat) (dog) hair and dander: Secondary | ICD-10-CM | POA: Diagnosis not present

## 2024-12-17 ENCOUNTER — Ambulatory Visit

## 2024-12-17 DIAGNOSIS — J309 Allergic rhinitis, unspecified: Secondary | ICD-10-CM

## 2024-12-17 DIAGNOSIS — J302 Other seasonal allergic rhinitis: Secondary | ICD-10-CM

## 2025-01-14 ENCOUNTER — Ambulatory Visit: Admitting: *Deleted

## 2025-01-14 DIAGNOSIS — J302 Other seasonal allergic rhinitis: Secondary | ICD-10-CM | POA: Diagnosis not present

## 2025-01-14 DIAGNOSIS — J309 Allergic rhinitis, unspecified: Secondary | ICD-10-CM

## 2025-02-02 ENCOUNTER — Other Ambulatory Visit: Payer: Self-pay | Admitting: Allergy

## 2025-02-03 ENCOUNTER — Ambulatory Visit: Admitting: Allergy
